# Patient Record
Sex: Female | Born: 1960
Health system: Southern US, Community
[De-identification: ages and names within clinical notes are randomized; demographics above are authoritative.]

## PROBLEM LIST (undated history)

## (undated) DIAGNOSIS — L920 Granuloma annulare: Secondary | ICD-10-CM

## (undated) DIAGNOSIS — M1990 Unspecified osteoarthritis, unspecified site: Secondary | ICD-10-CM

## (undated) DIAGNOSIS — C801 Malignant (primary) neoplasm, unspecified: Secondary | ICD-10-CM

## (undated) DIAGNOSIS — I341 Nonrheumatic mitral (valve) prolapse: Secondary | ICD-10-CM

## (undated) DIAGNOSIS — R011 Cardiac murmur, unspecified: Secondary | ICD-10-CM

## (undated) HISTORY — DX: Cardiac murmur, unspecified: R01.1

## (undated) HISTORY — PX: SMALL INTESTINE SURGERY: SHX150

## (undated) HISTORY — DX: Unspecified osteoarthritis, unspecified site: M19.90

## (undated) HISTORY — PX: OTHER SURGICAL HISTORY: SHX169

---

## 1997-12-12 ENCOUNTER — Other Ambulatory Visit: Admission: RE | Admit: 1997-12-12 | Discharge: 1997-12-12 | Payer: Self-pay | Admitting: Gynecology

## 1998-01-16 ENCOUNTER — Emergency Department (HOSPITAL_COMMUNITY): Admission: EM | Admit: 1998-01-16 | Discharge: 1998-01-16 | Payer: Self-pay | Admitting: Emergency Medicine

## 1998-10-10 ENCOUNTER — Inpatient Hospital Stay (HOSPITAL_COMMUNITY): Admission: AD | Admit: 1998-10-10 | Discharge: 1998-10-12 | Payer: Self-pay | Admitting: Gynecology

## 1998-11-25 ENCOUNTER — Other Ambulatory Visit: Admission: RE | Admit: 1998-11-25 | Discharge: 1998-11-25 | Payer: Self-pay | Admitting: Gynecology

## 1999-11-25 ENCOUNTER — Other Ambulatory Visit: Admission: RE | Admit: 1999-11-25 | Discharge: 1999-11-25 | Payer: Self-pay | Admitting: Gynecology

## 2000-12-16 ENCOUNTER — Encounter: Payer: Self-pay | Admitting: Gynecology

## 2000-12-16 ENCOUNTER — Ambulatory Visit (HOSPITAL_COMMUNITY): Admission: RE | Admit: 2000-12-16 | Discharge: 2000-12-16 | Payer: Self-pay | Admitting: Gynecology

## 2001-02-02 ENCOUNTER — Other Ambulatory Visit: Admission: RE | Admit: 2001-02-02 | Discharge: 2001-02-02 | Payer: Self-pay | Admitting: Gynecology

## 2001-10-21 ENCOUNTER — Emergency Department (HOSPITAL_COMMUNITY): Admission: EM | Admit: 2001-10-21 | Discharge: 2001-10-21 | Payer: Self-pay | Admitting: Emergency Medicine

## 2001-10-21 ENCOUNTER — Encounter: Payer: Self-pay | Admitting: Emergency Medicine

## 2002-01-03 ENCOUNTER — Ambulatory Visit (HOSPITAL_COMMUNITY): Admission: RE | Admit: 2002-01-03 | Discharge: 2002-01-03 | Payer: Self-pay | Admitting: Gynecology

## 2002-01-03 ENCOUNTER — Encounter: Payer: Self-pay | Admitting: Gynecology

## 2002-03-27 ENCOUNTER — Other Ambulatory Visit: Admission: RE | Admit: 2002-03-27 | Discharge: 2002-03-27 | Payer: Self-pay | Admitting: Gynecology

## 2002-05-03 ENCOUNTER — Encounter: Admission: RE | Admit: 2002-05-03 | Discharge: 2002-05-03 | Payer: Self-pay | Admitting: Gynecology

## 2002-05-03 ENCOUNTER — Encounter: Payer: Self-pay | Admitting: Gynecology

## 2003-04-16 ENCOUNTER — Other Ambulatory Visit: Admission: RE | Admit: 2003-04-16 | Discharge: 2003-04-16 | Payer: Self-pay | Admitting: Gynecology

## 2003-05-01 ENCOUNTER — Encounter: Admission: RE | Admit: 2003-05-01 | Discharge: 2003-05-01 | Payer: Self-pay | Admitting: Gynecology

## 2003-09-17 ENCOUNTER — Other Ambulatory Visit: Admission: RE | Admit: 2003-09-17 | Discharge: 2003-09-17 | Payer: Self-pay | Admitting: Gynecology

## 2004-01-02 ENCOUNTER — Other Ambulatory Visit: Admission: RE | Admit: 2004-01-02 | Discharge: 2004-01-02 | Payer: Self-pay | Admitting: Gynecology

## 2004-04-21 ENCOUNTER — Other Ambulatory Visit: Admission: RE | Admit: 2004-04-21 | Discharge: 2004-04-21 | Payer: Self-pay | Admitting: Gynecology

## 2004-06-09 ENCOUNTER — Encounter: Admission: RE | Admit: 2004-06-09 | Discharge: 2004-06-09 | Payer: Self-pay | Admitting: Gynecology

## 2004-10-22 ENCOUNTER — Other Ambulatory Visit: Admission: RE | Admit: 2004-10-22 | Discharge: 2004-10-22 | Payer: Self-pay | Admitting: Gynecology

## 2005-02-17 ENCOUNTER — Other Ambulatory Visit: Admission: RE | Admit: 2005-02-17 | Discharge: 2005-02-17 | Payer: Self-pay | Admitting: Gynecology

## 2005-05-21 ENCOUNTER — Other Ambulatory Visit: Admission: RE | Admit: 2005-05-21 | Discharge: 2005-05-21 | Payer: Self-pay | Admitting: Gynecology

## 2005-06-11 ENCOUNTER — Encounter: Admission: RE | Admit: 2005-06-11 | Discharge: 2005-06-11 | Payer: Self-pay | Admitting: Gynecology

## 2005-11-20 ENCOUNTER — Other Ambulatory Visit: Admission: RE | Admit: 2005-11-20 | Discharge: 2005-11-20 | Payer: Self-pay | Admitting: Family Medicine

## 2006-05-26 ENCOUNTER — Other Ambulatory Visit: Admission: RE | Admit: 2006-05-26 | Discharge: 2006-05-26 | Payer: Self-pay | Admitting: Gynecology

## 2006-06-16 ENCOUNTER — Encounter: Admission: RE | Admit: 2006-06-16 | Discharge: 2006-06-16 | Payer: Self-pay | Admitting: Gynecology

## 2006-11-25 ENCOUNTER — Other Ambulatory Visit: Admission: RE | Admit: 2006-11-25 | Discharge: 2006-11-25 | Payer: Self-pay | Admitting: Gynecology

## 2007-01-03 ENCOUNTER — Encounter: Admission: RE | Admit: 2007-01-03 | Discharge: 2007-01-03 | Payer: Self-pay | Admitting: Gynecology

## 2007-06-01 ENCOUNTER — Other Ambulatory Visit: Admission: RE | Admit: 2007-06-01 | Discharge: 2007-06-01 | Payer: Self-pay | Admitting: Gynecology

## 2008-01-02 ENCOUNTER — Ambulatory Visit: Payer: Self-pay | Admitting: Women's Health

## 2008-01-02 ENCOUNTER — Encounter: Payer: Self-pay | Admitting: Women's Health

## 2008-01-02 ENCOUNTER — Other Ambulatory Visit: Admission: RE | Admit: 2008-01-02 | Discharge: 2008-01-02 | Payer: Self-pay | Admitting: Gynecology

## 2008-06-06 ENCOUNTER — Encounter: Payer: Self-pay | Admitting: Women's Health

## 2008-06-06 ENCOUNTER — Ambulatory Visit: Payer: Self-pay | Admitting: Women's Health

## 2008-06-06 ENCOUNTER — Other Ambulatory Visit: Admission: RE | Admit: 2008-06-06 | Discharge: 2008-06-06 | Payer: Self-pay | Admitting: Gynecology

## 2008-07-02 ENCOUNTER — Encounter: Admission: RE | Admit: 2008-07-02 | Discharge: 2008-07-02 | Payer: Self-pay | Admitting: Gynecology

## 2009-06-10 ENCOUNTER — Ambulatory Visit: Payer: Self-pay | Admitting: Women's Health

## 2009-06-10 ENCOUNTER — Other Ambulatory Visit: Admission: RE | Admit: 2009-06-10 | Discharge: 2009-06-10 | Payer: Self-pay | Admitting: Gynecology

## 2009-07-03 ENCOUNTER — Encounter: Admission: RE | Admit: 2009-07-03 | Discharge: 2009-07-03 | Payer: Self-pay | Admitting: Obstetrics and Gynecology

## 2010-02-16 ENCOUNTER — Encounter: Payer: Self-pay | Admitting: Gynecology

## 2010-06-18 ENCOUNTER — Other Ambulatory Visit: Payer: Self-pay | Admitting: Women's Health

## 2010-06-18 DIAGNOSIS — Z1231 Encounter for screening mammogram for malignant neoplasm of breast: Secondary | ICD-10-CM

## 2010-06-19 ENCOUNTER — Encounter: Payer: Self-pay | Admitting: Women's Health

## 2010-06-26 ENCOUNTER — Other Ambulatory Visit: Payer: Self-pay | Admitting: Women's Health

## 2010-06-26 ENCOUNTER — Encounter (INDEPENDENT_AMBULATORY_CARE_PROVIDER_SITE_OTHER): Payer: BC Managed Care – PPO | Admitting: Women's Health

## 2010-06-26 ENCOUNTER — Other Ambulatory Visit (HOSPITAL_COMMUNITY)
Admission: RE | Admit: 2010-06-26 | Discharge: 2010-06-26 | Disposition: A | Discharge status: Home / Self Care | Payer: BC Managed Care – PPO | Source: Ambulatory Visit | Attending: Gynecology | Admitting: Gynecology

## 2010-06-26 DIAGNOSIS — Z124 Encounter for screening for malignant neoplasm of cervix: Secondary | ICD-10-CM | POA: Insufficient documentation

## 2010-06-26 DIAGNOSIS — E079 Disorder of thyroid, unspecified: Secondary | ICD-10-CM

## 2010-06-26 DIAGNOSIS — Z01419 Encounter for gynecological examination (general) (routine) without abnormal findings: Secondary | ICD-10-CM

## 2010-06-26 DIAGNOSIS — Z13220 Encounter for screening for lipoid disorders: Secondary | ICD-10-CM

## 2010-07-11 ENCOUNTER — Ambulatory Visit
Admission: RE | Admit: 2010-07-11 | Discharge: 2010-07-11 | Disposition: A | Discharge status: Home / Self Care | Payer: BC Managed Care – PPO | Source: Ambulatory Visit | Attending: Women's Health | Admitting: Women's Health

## 2010-07-11 DIAGNOSIS — Z1231 Encounter for screening mammogram for malignant neoplasm of breast: Secondary | ICD-10-CM

## 2011-03-30 ENCOUNTER — Encounter: Payer: Self-pay | Admitting: Internal Medicine

## 2011-04-23 ENCOUNTER — Encounter: Payer: Self-pay | Admitting: Internal Medicine

## 2011-04-23 ENCOUNTER — Ambulatory Visit (AMBULATORY_SURGERY_CENTER): Payer: BC Managed Care – PPO | Admitting: *Deleted

## 2011-04-23 VITALS — Ht 62.0 in | Wt 134.1 lb

## 2011-04-23 DIAGNOSIS — Z1211 Encounter for screening for malignant neoplasm of colon: Secondary | ICD-10-CM

## 2011-04-23 MED ORDER — PEG-KCL-NACL-NASULF-NA ASC-C 100 G PO SOLR: ORAL | Status: DC

## 2011-05-08 ENCOUNTER — Encounter: Payer: Self-pay | Admitting: Internal Medicine

## 2011-05-08 ENCOUNTER — Ambulatory Visit (AMBULATORY_SURGERY_CENTER): Payer: BC Managed Care – PPO | Admitting: Internal Medicine

## 2011-05-08 VITALS — BP 109/66 | HR 57 | Temp 97.2°F | Resp 16 | Ht 62.0 in | Wt 134.0 lb

## 2011-05-08 DIAGNOSIS — Z1211 Encounter for screening for malignant neoplasm of colon: Secondary | ICD-10-CM

## 2011-05-08 HISTORY — PX: COLONOSCOPY: SHX174

## 2011-05-08 MED ORDER — SODIUM CHLORIDE 0.9 % IV SOLN: 500.0000 mL | INTRAVENOUS | Status: DC

## 2011-05-08 NOTE — Patient Instructions (Signed)

## 2011-05-08 NOTE — Op Note (Addendum)
[facility] [address] [place], [place]  [address]  COLONOSCOPY PROCEDURE REPORT  PATIENT:  Sarah Jimenez, Sarah Jimenez  MR#:  [id number] BIRTHDATE:  1960-09-16, 51 yrs. old  GENDER:  female ENDOSCOPIST:  Hedwig Morton. Juanda Chance, MD REF. BY:  Dr Cain Saupe, Jacqlyn Larsen PROCEDURE DATE:  05/08/2011 PROCEDURE:  Colonoscopy 19147 ASA CLASS:  Class I INDICATIONS:  colorectal cancer screening, average risk MEDICATIONS:   MAC sedation, administered by CRNA, propofol (Diprivan) 350 mg  DESCRIPTION OF PROCEDURE:   After the risks and benefits and of the procedure were explained, informed consent was obtained. Digital rectal exam was performed and revealed no rectal masses. The LB 180AL [id number] endoscope was introduced through the anus and advanced to the cecum, which was identified by both the appendix and ileocecal valve.  The quality of the prep was good, using MoviPrep.  The instrument was then slowly withdrawn as the colon was fully examined. <<PROCEDUREIMAGES>>  FINDINGS:  No polyps or cancers were seen (see image1, image2, image3, image4, and image5).   Retroflexion was not performed. The scope was then withdrawn from the patient and the procedure completed.  COMPLICATIONS:  None ENDOSCOPIC IMPRESSION: 1) No polyps or cancers 2) Normal colonoscopy RECOMMENDATIONS: 1) High fiber diet.  REPEAT EXAM:  In 10 year(s) for.  ______________________________ Hedwig Morton. Juanda Chance, MD  CC:  n. REVISED:  05/08/2011 12:18 PM eSIGNED:   Hedwig Morton. Madi Bonfiglio at 05/08/2011 12:18 PM  Costella Hatcher, [phone]

## 2011-05-11 ENCOUNTER — Telehealth: Payer: Self-pay | Admitting: *Deleted

## 2011-05-11 NOTE — Telephone Encounter (Signed)
  Follow up Call-  Call back number 05/08/2011  Post procedure Call Back phone  # [phone]  Permission to leave phone message Yes     Patient questions:  Do you have a fever, pain , or abdominal swelling? no Pain Score  0 *  Have you tolerated food without any problems? yes  Have you been able to return to your normal activities? yes  Do you have any questions about your discharge instructions: Diet   no Medications  no Follow up visit  no  Do you have questions or concerns about your Care? no  Actions: * If pain score is 4 or above: No action needed, pain <4.

## 2011-06-25 ENCOUNTER — Other Ambulatory Visit: Payer: Self-pay | Admitting: Women's Health

## 2011-06-25 DIAGNOSIS — Z1231 Encounter for screening mammogram for malignant neoplasm of breast: Secondary | ICD-10-CM

## 2011-07-03 ENCOUNTER — Other Ambulatory Visit (HOSPITAL_COMMUNITY)
Admission: RE | Admit: 2011-07-03 | Discharge: 2011-07-03 | Disposition: A | Discharge status: Home / Self Care | Payer: BC Managed Care – PPO | Source: Ambulatory Visit | Attending: Women's Health | Admitting: Women's Health

## 2011-07-03 ENCOUNTER — Ambulatory Visit (INDEPENDENT_AMBULATORY_CARE_PROVIDER_SITE_OTHER): Payer: BC Managed Care – PPO | Admitting: Women's Health

## 2011-07-03 ENCOUNTER — Encounter: Payer: Self-pay | Admitting: Women's Health

## 2011-07-03 VITALS — BP 106/68 | Ht 63.0 in | Wt 129.0 lb

## 2011-07-03 DIAGNOSIS — B009 Herpesviral infection, unspecified: Secondary | ICD-10-CM | POA: Insufficient documentation

## 2011-07-03 DIAGNOSIS — Z13220 Encounter for screening for lipoid disorders: Secondary | ICD-10-CM

## 2011-07-03 DIAGNOSIS — E079 Disorder of thyroid, unspecified: Secondary | ICD-10-CM

## 2011-07-03 DIAGNOSIS — Z833 Family history of diabetes mellitus: Secondary | ICD-10-CM

## 2011-07-03 DIAGNOSIS — Z01419 Encounter for gynecological examination (general) (routine) without abnormal findings: Secondary | ICD-10-CM | POA: Insufficient documentation

## 2011-07-03 DIAGNOSIS — Z1159 Encounter for screening for other viral diseases: Secondary | ICD-10-CM | POA: Insufficient documentation

## 2011-07-03 DIAGNOSIS — N841 Polyp of cervix uteri: Secondary | ICD-10-CM

## 2011-07-03 LAB — CBC WITH DIFFERENTIAL/PLATELET
Basophils Absolute: 0.1 10*3/uL (ref 0.0–0.1)
Basophils Relative: 2 % — ABNORMAL HIGH (ref 0–1)
Eosinophils Absolute: 0.1 10*3/uL (ref 0.0–0.7)
Eosinophils Relative: 3 % (ref 0–5)
HCT: 41.1 % (ref 36.0–46.0)
Hemoglobin: 13.8 g/dL (ref 12.0–15.0)
Lymphocytes Relative: 28 % (ref 12–46)
Lymphs Abs: 1.4 10*3/uL (ref 0.7–4.0)
MCH: 31.7 pg (ref 26.0–34.0)
MCHC: 33.6 g/dL (ref 30.0–36.0)
MCV: 94.3 fL (ref 78.0–100.0)
Monocytes Absolute: 0.3 10*3/uL (ref 0.1–1.0)
Monocytes Relative: 5 % (ref 3–12)
Neutro Abs: 3.2 10*3/uL (ref 1.7–7.7)
Neutrophils Relative %: 62 % (ref 43–77)
Platelets: 357 10*3/uL (ref 150–400)
RBC: 4.36 MIL/uL (ref 3.87–5.11)
RDW: 14.1 % (ref 11.5–15.5)
WBC: 5.1 10*3/uL (ref 4.0–10.5)

## 2011-07-03 LAB — LIPID PANEL
Cholesterol: 178 mg/dL (ref 0–200)
HDL: 81 mg/dL (ref >39)
LDL Cholesterol: 85 mg/dL (ref 0–99)
Total CHOL/HDL Ratio: 2.2 Ratio
Triglycerides: 59 mg/dL (ref <150)
VLDL: 12 mg/dL (ref 0–40)

## 2011-07-03 LAB — TSH: TSH: 1.81 u[IU]/mL (ref 0.350–4.500)

## 2011-07-03 LAB — GLUCOSE, RANDOM: Glucose, Bld: 85 mg/dL (ref 70–99)

## 2011-07-03 NOTE — Progress Notes (Signed)
Sarah Jimenez [birth year] [id number]    History:    The patient presents for annual exam.  Cycles every 1-3 months for 5 days/vasectomy/same partner for years. History of ascus with negative high-risk HPV in 08, several Paps with ascus/suspicion for LGSIL/CIN-1. Had a negative colonoscopy April 2013. History of normal mammograms, right breast 1 cm mobile nontender nodule at 8:00 below areola that has been ultrasounded and stable for years. Evaluated at [facility] in 2009.   Past medical history, past surgical history, family history and social history were all reviewed and documented in the EPIC chart. 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   ROS:  A  ROS was performed and pertinent positives and negatives are included in the history.  Exam:  Filed Vitals:   07/03/11 0855  BP: 106/68    General appearance:  Normal Head/Neck:  Normal, without cervical or supraclavicular adenopathy. Thyroid:  Symmetrical, normal in size, without palpable masses or nodularity. Respiratory  Effort:  Normal  Auscultation:  Clear without wheezing or rhonchi Cardiovascular  Auscultation:  Regular rate, without rubs, murmurs or gallops  Edema/varicosities:  Not grossly evident Abdominal  Soft,nontender, without masses, guarding or rebound.  Liver/spleen:  No organomegaly noted  Hernia:  None appreciated  Skin  Inspection:  Grossly normal  Palpation:  Grossly normal Neurologic/psychiatric  Orientation:  Normal with appropriate conversation.  Mood/affect:  Normal  Genitourinary    Breasts: Examined lying and sitting.     Right: Without masses, retractions, discharge or axillary adenopathy.     Left: Without masses, retractions, discharge or axillary adenopathy.   Inguinal/mons:  Normal without inguinal adenopathy  External genitalia:  Normal  BUS/Urethra/Skene's glands:  Normal  Bladder:  Normal  Vagina:  Normal  Cervix:  Normal/small less than 1 cm polyp removed in fragments.  Uterus:    normal in size, shape and contour.  Midline and mobile  Adnexa/parametria:     Rt: Without masses or tenderness.   Lt: Without masses or tenderness.  Anus and perineum: Normal  Digital rectal exam: Normal sphincter tone without palpated masses or tenderness  Assessment/Plan:  51 y.o. D WF G4 P. for for annual exam with complaint of vaginal dryness.  Perimenopausal exam History of ascus with negative high-risk HPV in 2008 with normal Paps after Stable right breast nodule HSV-2-rare outbreaks  Plan: Reviewed importance of SBE's reporting changes and annual mammogram. Encouraged vaginal lubricants for dryness, menopause reviewed, will call if becomes amenorrheic. CBC, glucose, lipid panel, TSH, UA and Pap with HPV typing. Reviewed and new guidelines for Pap screening.Tdap vaccine reviewed and recommended. Acyclovir 200 4 times a day when necessary,denies need for new prescription.    Harrington Challenger [facility], 12:07 PM 07/03/2011

## 2011-07-03 NOTE — Patient Instructions (Signed)
Tdap vaccine Health Recommendations for Postmenopausal Women Based on the Results of the Women's Health Initiative (WHI) and Other Studies The WHI is a major 15-year research program to address the most common causes of death, disability and poor quality of life in postmenopausal women. Some of these causes are heart disease, cancer, bone loss (osteoporosis) and others. Taking into account all of the findings from WHI and other studies, here are bottom-line health recommendations for women: CARDIOVASCULAR DISEASE Heart Disease: A heart attack is a medical emergency. Know the signs and symptoms of a heart attack. Hormone therapy should not be used to prevent heart disease. In women with heart disease, hormone therapy should not be used to prevent further disease. Hormone therapy increases the risk of blood clots. Below are things women can do to reduce their risk for heart disease.   Do not smoke. If you smoke, quit. Women who smoke are 2 to 6 times more likely to suffer a heart attack than non-smoking women.   Aim for a healthy weight. Being overweight causes many preventable deaths. Eat a healthy and balanced diet and drink an adequate amount of liquids.   Get moving. Make a commitment to be more physically active. Aim for 30 minutes of activity on most, if not all days of the week.   Eat for heart health. Choose a diet that is low in saturated fat, trans fat, and cholesterol. Include whole grains, vegetables, and fruits. Read the labels on the food container before buying it.   Know your numbers. Ask your caregiver to check your blood pressure, cholesterol (total, HDL, LDL, triglycerides) and blood glucose. Work with your caregiver to improve any numbers that are not normal.   High blood pressure. Limit or stop your table salt intake (try salt substitute and food seasonings), avoid salty foods and drinks. Read the labels on the food container before buying it. Avoid becoming overweight by eating  well and exercising.  STROKE  Stroke is a medical emergency. Stroke can be the result of a blood clot in the blood vessel in the brain or by a brain hemorrhage (bleeding). Know the signs and symptoms of a stroke. To lower the risk of developing a stroke:  Avoid fatty foods.   Quit smoking.   Control your diabetes, blood pressure, and irregular heart rate.  THROMBOPHLIBITIS (BLOOD CLOT) OF THE LEG  Hormone treatment is a big cause of developing blood clots in the leg. Becoming overweight and leading a stationary lifestyle also may contribute to developing blood clots. Controlling your diet and exercising will help lower the risk of developing blood clots. CANCER SCREENING  Breast Cancer: Women should take steps to reduce their risk of breast cancer. This includes having regular mammograms, monthly self breast exams and regular breast exams by your caregiver. Have a mammogram every one to two years if you are 40 to 51 years old. Have a mammogram annually if you are 50 years old or older depending on your risk factors. Women who are high risk for breast cancer may need more frequent mammograms. There are tests available (testing the genes in your body) if you have family history of breast cancer called BRCA 1 and 2. These tests can help determine the risks of developing breast cancer.   Intestinal or Stomach Cancer: Women should talk to their caregiver about when to start screening, what tests and how often they should be done, and the benefits and risks of doing these tests. Tests to consider are a rectal   exam, fecal occult blood, sigmoidoscopy, colononoscoby, barium enema and upper GI series of the stomach. Depending on the age, you may want to get a medical and family history of colon cancer. Women who are high risk may need to be screened at an earlier age and more often.   Cervical Cancer: A Pap test of the cervix should be done every year and every 3 years when there has been three straight years  of a normal Pap test. Women with an abnormal Pap test should be screened more often or have a cervical biopsy depending on your caregiver's recommendation.   Uterine Cancer: If you have vaginal bleeding after you are in the menopause, it should be evaluated by your caregiver.   Ovarian cancer: There are no reliable tests available to screen for ovarian cancer at this time except for yearly pelvic exams.   Lung Cancer: Yearly chest X-rays can detect lung cancer and should be done on high risk women, such as cigarette smokers and women with chronic lung disease (emphysemia).   Skin Cancer: A complete body skin exam should be done at your yearly examination. Avoid overexposure to the sun and ultraviolet light lamps. Use a strong sun block cream when in the sun. All of these things are important in lowering the risk of skin cancer.  MENOPAUSE Menopause Symptoms: Hormone therapy products are effective for treating symptoms associated with menopause:  Moderate to severe hot flashes.   Night sweats.   Mood swings.   Headaches.   Tiredness.   Loss of sex drive.   Insomnia.   Other symptoms.  However, hormone therapy products carry serious risks, especially in older women. Women who use or are thinking about using estrogen or estrogen with progestin treatments should discuss that with their caregiver. Your caregiver will know if the benefits outweigh the risks. The Food and Drug Administration (FDA) has concluded that hormone therapy should be used only at the lowest doses and for the shortest amount of time to reach treatment goals. It is not known at what doses there may be less risk of serious side effects. There are other treatments such as herbal medication (not controlled or regulated by the FDA), group therapy, counseling and acupuncture that may be helpful. OSTEOPOROSIS Protecting Against Bone Loss and Preventing Fracture: If hormone therapy is used for prevention of bone loss  (osteoporosis), the risks for bone loss must outweigh the risk of the therapy. Women considering taking hormone therapy for bone loss should ask their health care providers about other medications (fosamax and boniva) that are considered safe and effective for preventing bone loss and bone fractures. To guard against bone loss or fractures, it is recommended that women should take at least 1000-1500 mg of calcium and 400-800 IU of vitamin D daily in divided doses. Smoking and excessive alcohol intake increases the risk of osteoporosis. Eat foods rich in calcium and vitamin D and do weight bearing exercises several times a week as your caregiver suggests. DIABETES Diabetes Melitus: Women with Type I or Type 2 diabetes should keep their diabetes in control with diet, exercise and medication. Avoid too many sweets, starchy and fatty foods. Being overweight can affect your diabetes. COGNITION AND MEMORY Cognition and Memory: Menopausal hormone therapy is not recommended for the prevention of cognitive disorders such as Alzheimer's disease or memory loss. WHI found that women treated with hormone therapy have a greater risk of developing dementia.  DEPRESSION  Depression may occur at any age, but is common in   elderly women. The reasons may be because of physical, medical, social (loneliness), financial and/or economic problems and needs. Becoming involved with church, volunteer or social groups, seeking treatment for any physical or medical problems is recommended. Also, look into getting professional advice for any economic or financial problems. ACCIDENTS  Accidents are common and can be serious in the elderly woman. Prepare your house to prevent accidents. Eliminate throw rugs, use hip protectors, place hand bars in the bath, shower and toilet areas. Avoid wearing high heel shoes and walking on wet, snowy and icy areas. Stop driving if you have vision, hearing problems or are unsteady with you movements and  reflexes. RHEUMATOID ARTHRITIS Rheumatoid arthritis causes pain, swelling and stiffness of your bone joints. It can limit many of your activities. Over-the-counter medications may help, but prescription medications may be necessary. Talk with your caregiver about this. Exercise (walking, water aerobics), good posture, using splints on painful joints, warm baths or applying warm compresses to stiff joints and cold compresses to painful joints may be helpful. Smoking and excessive drinking may worsen the symptoms of arthritis. Seek help from a physical therapist if the arthritis is becoming a problem with your daily activities. IMMUNIZATIONS  Several immunizations are important to have during your senior years, including:   Tetanus and a diptheria shot booster every 10 years.   Influenza every year before the flu season begins.   Pneumonia vaccine.   Shingles vaccine.   Others as indicated (example: H1N1 vaccine).  Document Released: 03/06/2005 Document Revised: 01/01/2011 Document Reviewed: 10/31/2007 Exit[employer] Patient Information 2012 [employer]. 

## 2011-07-04 LAB — URINALYSIS W MICROSCOPIC + REFLEX CULTURE
Bacteria, UA: NONE SEEN
Bilirubin Urine: NEGATIVE
Casts: NONE SEEN
Crystals: NONE SEEN
Glucose, UA: NEGATIVE mg/dL
Hgb urine dipstick: NEGATIVE
Ketones, ur: NEGATIVE mg/dL
Leukocytes, UA: NEGATIVE
Nitrite: NEGATIVE
Protein, ur: NEGATIVE mg/dL
Specific Gravity, Urine: <1.005 — ABNORMAL LOW (ref 1.005–1.030)
Squamous Epithelial / LPF: NONE SEEN
Urobilinogen, UA: 0.2 mg/dL (ref 0.0–1.0)
pH: 7.5 (ref 5.0–8.0)

## 2011-07-24 ENCOUNTER — Ambulatory Visit
Admission: RE | Admit: 2011-07-24 | Discharge: 2011-07-24 | Disposition: A | Discharge status: Home / Self Care | Payer: BC Managed Care – PPO | Source: Ambulatory Visit | Attending: Women's Health | Admitting: Women's Health

## 2011-07-24 DIAGNOSIS — Z1231 Encounter for screening mammogram for malignant neoplasm of breast: Secondary | ICD-10-CM

## 2011-10-02 ENCOUNTER — Other Ambulatory Visit: Payer: Self-pay | Admitting: *Deleted

## 2011-10-02 MED ORDER — ACYCLOVIR 200 MG PO CAPS: 200.0000 mg | ORAL_CAPSULE | Freq: Every day | ORAL | Status: DC

## 2012-07-04 ENCOUNTER — Ambulatory Visit (INDEPENDENT_AMBULATORY_CARE_PROVIDER_SITE_OTHER): Payer: BC Managed Care – PPO | Admitting: Women's Health

## 2012-07-04 ENCOUNTER — Encounter: Payer: Self-pay | Admitting: Women's Health

## 2012-07-04 ENCOUNTER — Other Ambulatory Visit (HOSPITAL_COMMUNITY)
Admission: RE | Admit: 2012-07-04 | Discharge: 2012-07-04 | Disposition: A | Discharge status: Home / Self Care | Payer: BC Managed Care – PPO | Source: Ambulatory Visit | Attending: Gynecology | Admitting: Gynecology

## 2012-07-04 VITALS — BP 112/70 | Ht 63.5 in | Wt 131.0 lb

## 2012-07-04 DIAGNOSIS — Z833 Family history of diabetes mellitus: Secondary | ICD-10-CM

## 2012-07-04 DIAGNOSIS — Z01419 Encounter for gynecological examination (general) (routine) without abnormal findings: Secondary | ICD-10-CM | POA: Insufficient documentation

## 2012-07-04 DIAGNOSIS — G4700 Insomnia, unspecified: Secondary | ICD-10-CM

## 2012-07-04 LAB — CBC WITH DIFFERENTIAL/PLATELET
Basophils Absolute: 0.1 10*3/uL (ref 0.0–0.1)
Basophils Relative: 2 % — ABNORMAL HIGH (ref 0–1)
Eosinophils Absolute: 0.2 10*3/uL (ref 0.0–0.7)
Eosinophils Relative: 4 % (ref 0–5)
HCT: 37.2 % (ref 36.0–46.0)
Hemoglobin: 12.4 g/dL (ref 12.0–15.0)
Lymphocytes Relative: 25 % (ref 12–46)
Lymphs Abs: 1 10*3/uL (ref 0.7–4.0)
MCH: 30.1 pg (ref 26.0–34.0)
MCHC: 33.3 g/dL (ref 30.0–36.0)
MCV: 90.3 fL (ref 78.0–100.0)
Monocytes Absolute: 0.4 10*3/uL (ref 0.1–1.0)
Monocytes Relative: 10 % (ref 3–12)
Neutro Abs: 2.4 10*3/uL (ref 1.7–7.7)
Neutrophils Relative %: 59 % (ref 43–77)
Platelets: 344 10*3/uL (ref 150–400)
RBC: 4.12 MIL/uL (ref 3.87–5.11)
RDW: 13.9 % (ref 11.5–15.5)
WBC: 4 10*3/uL (ref 4.0–10.5)

## 2012-07-04 LAB — GLUCOSE, RANDOM: Glucose, Bld: 86 mg/dL (ref 70–99)

## 2012-07-04 LAB — TSH: TSH: 2.907 u[IU]/mL (ref 0.350–4.500)

## 2012-07-04 MED ORDER — ALPRAZOLAM 0.25 MG PO TABS: 0.2500 mg | ORAL_TABLET | Freq: Every evening | ORAL | Status: DC | PRN

## 2012-07-04 NOTE — Addendum Note (Signed)
Addended by: Richardson Chiquito on: 07/04/2012 09:42 AM   Modules accepted: Orders

## 2012-07-04 NOTE — Patient Instructions (Signed)

## 2012-07-04 NOTE — Progress Notes (Signed)
Sarah Jimenez [birth year] [id number]    History:    The patient presents for annual exam.  Continues with cycles every one to 2 months/vasectomy/same partner. History of right breast 1 cm mobile cyst stable, has seen physicianat [facility], mammograms normal. Ascus with negative HR HPV 2008 with normal Paps after. HSV-2 with rare outbreaks uses a cycle of your on occasion. Negative colonoscopy 2013. Having occasional hot flushes, and some difficulty with sleeping. Excellent lipid panel.  Past medical history, past surgical history, family history and social history were all reviewed and documented in the EPIC chart. 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 Three have some issues with anxiety   ROS:  A  ROS was performed and pertinent positives and negatives are included in the history.  Exam:  Filed Vitals:   07/04/12 0831  BP: 112/70    General appearance:  Normal Head/Neck:  Normal, without cervical or supraclavicular adenopathy. Thyroid:  Symmetrical, normal in size, without palpable masses or nodularity. Respiratory  Effort:  Normal  Auscultation:  Clear without wheezing or rhonchi Cardiovascular  Auscultation:  Regular rate, without rubs, murmurs or gallops  Edema/varicosities:  Not grossly evident Abdominal  Soft,nontender, without masses, guarding or rebound.  Liver/spleen:  No organomegaly noted  Hernia:  None appreciated  Skin  Inspection:  Grossly normal  Palpation:  Grossly normal Neurologic/psychiatric  Orientation:  Normal with appropriate conversation.  Mood/affect:  Normal  Genitourinary    Breasts: Examined lying and sitting.     Right: Without masses, retractions, discharge or axillary adenopathy.     Left: Without masses, retractions, discharge or axillary adenopathy.   Inguinal/mons:  Normal without inguinal adenopathy  External genitalia:  Normal  BUS/Urethra/Skene's glands:  Normal  Bladder:  Normal  Vagina:  Normal  Cervix:   Normal  Uterus: normal in size, shape and contour.  Midline and mobile  Adnexa/parametria:     Rt: Without masses or tenderness.   Lt: Without masses or tenderness.  Anus and perineum: Normal  Digital rectal exam: Normal sphincter tone without palpated masses or tenderness  Assessment/Plan:  52 y.o. [name] for annual exam with complaint of insomnia.  Perimenopausal exam HSV-2 rare outbreaks Situational stress her/anxiety/insomnia  Plan: Counseling as needed, Xanax 0.25 at bedtime as needed, aware addictive properties. SBE's, continue annual mammogram, calcium rich diet, vitamin D 1000 daily. Instructed to call if cycles space greater than 3 months. CBC, glucose, TSH, UA, Pap.    Harrington Challenger [facility], 9:27 AM 07/04/2012

## 2012-07-05 LAB — URINALYSIS W MICROSCOPIC + REFLEX CULTURE
Bacteria, UA: NONE SEEN
Bilirubin Urine: NEGATIVE
Casts: NONE SEEN
Crystals: NONE SEEN
Glucose, UA: NEGATIVE mg/dL
Hgb urine dipstick: NEGATIVE
Ketones, ur: NEGATIVE mg/dL
Leukocytes, UA: NEGATIVE
Nitrite: NEGATIVE
Protein, ur: NEGATIVE mg/dL
Specific Gravity, Urine: 1.008 (ref 1.005–1.030)
Squamous Epithelial / LPF: NONE SEEN
Urobilinogen, UA: 0.2 mg/dL (ref 0.0–1.0)
pH: 7 (ref 5.0–8.0)

## 2012-07-11 ENCOUNTER — Other Ambulatory Visit: Payer: Self-pay

## 2012-07-11 DIAGNOSIS — Z1231 Encounter for screening mammogram for malignant neoplasm of breast: Secondary | ICD-10-CM

## 2012-08-10 ENCOUNTER — Ambulatory Visit: Payer: BC Managed Care – PPO

## 2012-09-15 ENCOUNTER — Other Ambulatory Visit: Payer: Self-pay | Admitting: Women's Health

## 2012-09-23 ENCOUNTER — Ambulatory Visit
Admission: RE | Admit: 2012-09-23 | Discharge: 2012-09-23 | Disposition: A | Discharge status: Home / Self Care | Payer: BC Managed Care – PPO | Source: Ambulatory Visit

## 2012-09-23 DIAGNOSIS — Z1231 Encounter for screening mammogram for malignant neoplasm of breast: Secondary | ICD-10-CM

## 2012-12-04 ENCOUNTER — Other Ambulatory Visit: Payer: Self-pay | Admitting: Women's Health

## 2012-12-06 NOTE — Telephone Encounter (Signed)
rx called in [facility] 

## 2013-01-09 ENCOUNTER — Other Ambulatory Visit: Payer: Self-pay

## 2013-01-09 NOTE — Telephone Encounter (Signed)
Patient called requesting refill on Xanax.  I noted last one to be 12/04/12.

## 2013-01-10 MED ORDER — ALPRAZOLAM 0.25 MG PO TABS: ORAL_TABLET | ORAL | Status: DC

## 2013-01-10 NOTE — Telephone Encounter (Signed)
Called into pharmacy

## 2013-04-14 ENCOUNTER — Other Ambulatory Visit: Payer: Self-pay | Admitting: Women's Health

## 2013-07-03 ENCOUNTER — Ambulatory Visit
Admission: RE | Admit: 2013-07-03 | Discharge: 2013-07-03 | Disposition: A | Discharge status: Home / Self Care | Payer: BC Managed Care – PPO | Source: Ambulatory Visit | Attending: Family | Admitting: Family

## 2013-07-03 ENCOUNTER — Other Ambulatory Visit: Payer: Self-pay | Admitting: Family

## 2013-07-03 DIAGNOSIS — M5412 Radiculopathy, cervical region: Secondary | ICD-10-CM

## 2013-07-07 ENCOUNTER — Ambulatory Visit (INDEPENDENT_AMBULATORY_CARE_PROVIDER_SITE_OTHER): Payer: BC Managed Care – PPO | Admitting: Women's Health

## 2013-07-07 ENCOUNTER — Other Ambulatory Visit (HOSPITAL_COMMUNITY)
Admission: RE | Admit: 2013-07-07 | Discharge: 2013-07-07 | Disposition: A | Discharge status: Home / Self Care | Payer: BC Managed Care – PPO | Source: Ambulatory Visit | Attending: Women's Health | Admitting: Women's Health

## 2013-07-07 ENCOUNTER — Encounter: Payer: Self-pay | Admitting: Women's Health

## 2013-07-07 VITALS — BP 114/74 | Ht 63.25 in | Wt 138.2 lb

## 2013-07-07 DIAGNOSIS — Z01419 Encounter for gynecological examination (general) (routine) without abnormal findings: Secondary | ICD-10-CM

## 2013-07-07 DIAGNOSIS — B009 Herpesviral infection, unspecified: Secondary | ICD-10-CM

## 2013-07-07 DIAGNOSIS — Z1151 Encounter for screening for human papillomavirus (HPV): Secondary | ICD-10-CM | POA: Insufficient documentation

## 2013-07-07 DIAGNOSIS — Z13220 Encounter for screening for lipoid disorders: Secondary | ICD-10-CM

## 2013-07-07 LAB — CBC WITH DIFFERENTIAL/PLATELET
Basophils Absolute: 0 10*3/uL (ref 0.0–0.1)
Basophils Relative: 0 % (ref 0–1)
Eosinophils Absolute: 0.1 10*3/uL (ref 0.0–0.7)
Eosinophils Relative: 1 % (ref 0–5)
HCT: 36.8 % (ref 36.0–46.0)
Hemoglobin: 12.4 g/dL (ref 12.0–15.0)
Lymphocytes Relative: 10 % — ABNORMAL LOW (ref 12–46)
Lymphs Abs: 0.9 10*3/uL (ref 0.7–4.0)
MCH: 31.4 pg (ref 26.0–34.0)
MCHC: 33.7 g/dL (ref 30.0–36.0)
MCV: 93.2 fL (ref 78.0–100.0)
Monocytes Absolute: 0.3 10*3/uL (ref 0.1–1.0)
Monocytes Relative: 3 % (ref 3–12)
Neutro Abs: 7.7 10*3/uL (ref 1.7–7.7)
Neutrophils Relative %: 86 % — ABNORMAL HIGH (ref 43–77)
Platelets: 333 10*3/uL (ref 150–400)
RBC: 3.95 MIL/uL (ref 3.87–5.11)
RDW: 13.1 % (ref 11.5–15.5)
WBC: 8.9 10*3/uL (ref 4.0–10.5)

## 2013-07-07 MED ORDER — ALPRAZOLAM 0.25 MG PO TABS: ORAL_TABLET | ORAL | Status: DC

## 2013-07-07 MED ORDER — ACYCLOVIR 200 MG PO CAPS: ORAL_CAPSULE | ORAL | Status: DC

## 2013-07-07 NOTE — Addendum Note (Signed)
Addended by: Alen Blew on: 07/07/2013 11:10 AM   Modules accepted: Orders

## 2013-07-07 NOTE — Patient Instructions (Signed)

## 2013-07-07 NOTE — Progress Notes (Signed)
Sarah Jimenez 1960-05-26 [id number]    History:    Presents for annual exam.  Cycles every 2-3 months/vasectomy/same partner. Continues to have some issues with insomnia and mild anxiety. Uses occasional Xanax. HSV with rare outbreaks . 2008 ascus with negative HR HPV with normal Paps after. Negative colonoscopy 2013. Normal mammogram history. History of right breast cyst - stable was seen at [facility].  Past medical history, past surgical history, family history and social history were all reviewed and documented in the EPIC chart. 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 4 children 22, 20, and 17, 14 on doing well. Father and brother hypertension. Mother healthy, father died from pancreatic cancer age 79.  ROS:  A  12 point ROS was performed and pertinent positives and negatives are included.  Exam:  Filed Vitals:   07/07/13 0906  BP: 114/74    General appearance:  Normal Thyroid:  Symmetrical, normal in size, without palpable masses or nodularity. Respiratory  Auscultation:  Clear without wheezing or rhonchi Cardiovascular  Auscultation:  Regular rate, without rubs, murmurs or gallops  Edema/varicosities:  Not grossly evident Abdominal  Soft,nontender, without masses, guarding or rebound.  Liver/spleen:  No organomegaly noted  Hernia:  None appreciated  Skin  Inspection:  Grossly normal   Breasts: Examined lying and sitting.     Right: Without masses, retractions, discharge or axillary adenopathy.     Left: Without masses, retractions, discharge or axillary adenopathy. Gentitourinary   Inguinal/mons:  Normal without inguinal adenopathy  External genitalia:  Normal  BUS/Urethra/Skene's glands:  Normal  Vagina:  Normal  Cervix:  Normal  Uterus:   normal in size, shape and contour.  Midline and mobile  Adnexa/parametria:     Rt: Without masses or tenderness.   Lt: Without masses or tenderness.  Anus and perineum: Normal  Digital rectal exam: Normal sphincter tone without  palpated masses or tenderness  Assessment/Plan:  53 y.o. [name] for annual exam.    Perimenopausal/vasectomy HSV 2 rare outbreaks Right breast cyst  Plan: SBE's, continue annual mammogram, 3-D tomography reviewed and encouraged, history of dense breasts. Continue regular exercise, calcium rich diet, vitamin D 1000 daily encouraged. Xanax 0.25 prescription, proper use, addictive properties reviewed and she sparingly. Acyclovir 200 as needed, has rare outbreaks. CBC, comprehensive metabolic panel, lipid panel, UA, Pap with HR HPV typing, new screening guidelines reviewed. Pap normal 2013. Instructed to call if cycles space greater than 4 months or increased menopausal symptoms.  Note: This dictation was prepared with Dragon/digital dictation.  Any transcriptional errors that result are unintentional. Huel Cote [facility], 9:58 AM 07/07/2013

## 2013-07-08 LAB — URINALYSIS W MICROSCOPIC + REFLEX CULTURE
Bacteria, UA: NONE SEEN
Bilirubin Urine: NEGATIVE
Casts: NONE SEEN
Crystals: NONE SEEN
Glucose, UA: NEGATIVE mg/dL
Hgb urine dipstick: NEGATIVE
Ketones, ur: NEGATIVE mg/dL
Nitrite: NEGATIVE
Protein, ur: NEGATIVE mg/dL
Specific Gravity, Urine: <1.005 — ABNORMAL LOW (ref 1.005–1.030)
Squamous Epithelial / LPF: NONE SEEN
Urobilinogen, UA: 0.2 mg/dL (ref 0.0–1.0)
pH: 6.5 (ref 5.0–8.0)

## 2013-07-08 LAB — LIPID PANEL
Cholesterol: 171 mg/dL (ref 0–200)
HDL: 80 mg/dL (ref >39)
LDL Cholesterol: 68 mg/dL (ref 0–99)
Total CHOL/HDL Ratio: 2.1 Ratio
Triglycerides: 117 mg/dL (ref <150)
VLDL: 23 mg/dL (ref 0–40)

## 2013-07-08 LAB — COMPREHENSIVE METABOLIC PANEL
ALT: 11 U/L (ref 0–35)
AST: 13 U/L (ref 0–37)
Albumin: 4.1 g/dL (ref 3.5–5.2)
Alkaline Phosphatase: 44 U/L (ref 39–117)
BUN: 11 mg/dL (ref 6–23)
CO2: 28 mEq/L (ref 19–32)
Calcium: 9 mg/dL (ref 8.4–10.5)
Chloride: 100 mEq/L (ref 96–112)
Creat: 0.82 mg/dL (ref 0.50–1.10)
Glucose, Bld: 106 mg/dL — ABNORMAL HIGH (ref 70–99)
Potassium: 4 mEq/L (ref 3.5–5.3)
Sodium: 137 mEq/L (ref 135–145)
Total Bilirubin: 0.5 mg/dL (ref 0.2–1.2)
Total Protein: 6.4 g/dL (ref 6.0–8.3)

## 2013-07-09 LAB — URINE CULTURE: Colony Count: 4000

## 2013-07-10 LAB — CYTOLOGY - PAP

## 2013-08-17 ENCOUNTER — Other Ambulatory Visit: Payer: Self-pay

## 2013-08-17 DIAGNOSIS — Z1231 Encounter for screening mammogram for malignant neoplasm of breast: Secondary | ICD-10-CM

## 2013-09-25 ENCOUNTER — Encounter (INDEPENDENT_AMBULATORY_CARE_PROVIDER_SITE_OTHER): Payer: Self-pay

## 2013-09-25 ENCOUNTER — Ambulatory Visit
Admission: RE | Admit: 2013-09-25 | Discharge: 2013-09-25 | Disposition: A | Discharge status: Home / Self Care | Payer: BC Managed Care – PPO | Source: Ambulatory Visit

## 2013-09-25 DIAGNOSIS — Z1231 Encounter for screening mammogram for malignant neoplasm of breast: Secondary | ICD-10-CM

## 2013-11-27 ENCOUNTER — Other Ambulatory Visit: Payer: Self-pay | Admitting: Dermatology

## 2013-11-27 ENCOUNTER — Encounter: Payer: Self-pay | Admitting: Women's Health

## 2014-03-15 ENCOUNTER — Encounter: Payer: Self-pay | Admitting: Women's Health

## 2014-03-15 ENCOUNTER — Ambulatory Visit (INDEPENDENT_AMBULATORY_CARE_PROVIDER_SITE_OTHER): Payer: BLUE CROSS/BLUE SHIELD | Admitting: Women's Health

## 2014-03-15 VITALS — BP 120/78 | Ht 63.0 in | Wt 142.0 lb

## 2014-03-15 DIAGNOSIS — Z79890 Hormone replacement therapy (postmenopausal): Secondary | ICD-10-CM

## 2014-03-15 MED ORDER — ESTRADIOL 0.05 MG/24HR TD PTWK: 0.0500 mg | MEDICATED_PATCH | TRANSDERMAL | Status: DC

## 2014-03-15 MED ORDER — PROGESTERONE MICRONIZED 200 MG PO CAPS: 200.0000 mg | ORAL_CAPSULE | Freq: Every day | ORAL | Status: DC

## 2014-03-15 NOTE — Patient Instructions (Signed)

## 2014-03-15 NOTE — Progress Notes (Signed)
Patient ID: Sarah Jimenez, female   DOB: 10-Feb-1960, 54 y.o.   MRN: [id number] Presents with menopausal symptoms of poor sleep, vaginal dryness, weight gain, no energy and generally not feeling well. Postmenopausal one year, states symptoms are increasingly more bothersome. Partner vasectomy. Having increased home stressors of finances, children, contemplating selling her home and work.  Exam: Appears well. Eye contact good, affect somewhat flat.   Menopausal symptoms  Plan: Options reviewed, would like to try HRT, reviewed risks of blood clots, strokes, breast cancer. No known risks. Climara 0.05 patch weekly, Prometrium 200 at bedtime day 1 through 12 of each month. Prescription, proper use given and reviewed. Encouraged increased exercise, decreasing carbs in diet for weight loss. Instructed to call if no relief of symptoms in one month. Will review at annual exam in June.

## 2014-04-12 ENCOUNTER — Telehealth: Payer: Self-pay | Admitting: *Deleted

## 2014-04-12 NOTE — Telephone Encounter (Signed)
Pt was prescribed HRT Climara 0.05 patch weekly, Prometrium 200 day 1-12 of each month. Pt said she noticed some spotting after taking progesterone if this was normal. Pt informed this is okay, should not bleeding heavy (ex: changing pad/tampon every 1 hour) pt was fine with this and will follow up if needed.

## 2014-07-11 ENCOUNTER — Encounter: Payer: Self-pay | Admitting: Women's Health

## 2014-07-11 ENCOUNTER — Ambulatory Visit (INDEPENDENT_AMBULATORY_CARE_PROVIDER_SITE_OTHER): Payer: BLUE CROSS/BLUE SHIELD | Admitting: Women's Health

## 2014-07-11 VITALS — BP 122/80 | Ht 63.0 in | Wt 140.0 lb

## 2014-07-11 DIAGNOSIS — Z01419 Encounter for gynecological examination (general) (routine) without abnormal findings: Secondary | ICD-10-CM

## 2014-07-11 DIAGNOSIS — F4322 Adjustment disorder with anxiety: Secondary | ICD-10-CM | POA: Diagnosis not present

## 2014-07-11 DIAGNOSIS — Z833 Family history of diabetes mellitus: Secondary | ICD-10-CM

## 2014-07-11 DIAGNOSIS — Z13220 Encounter for screening for lipoid disorders: Secondary | ICD-10-CM

## 2014-07-11 DIAGNOSIS — A609 Anogenital herpesviral infection, unspecified: Secondary | ICD-10-CM | POA: Diagnosis not present

## 2014-07-11 LAB — COMPREHENSIVE METABOLIC PANEL
ALT: 14 U/L (ref 0–35)
AST: 17 U/L (ref 0–37)
Albumin: 4.2 g/dL (ref 3.5–5.2)
Alkaline Phosphatase: 63 U/L (ref 39–117)
BUN: 16 mg/dL (ref 6–23)
CO2: 26 mEq/L (ref 19–32)
Calcium: 9.6 mg/dL (ref 8.4–10.5)
Chloride: 103 mEq/L (ref 96–112)
Creat: 0.77 mg/dL (ref 0.50–1.10)
Glucose, Bld: 83 mg/dL (ref 70–99)
Potassium: 4.1 mEq/L (ref 3.5–5.3)
Sodium: 139 mEq/L (ref 135–145)
Total Bilirubin: 0.4 mg/dL (ref 0.2–1.2)
Total Protein: 7.1 g/dL (ref 6.0–8.3)

## 2014-07-11 LAB — LIPID PANEL
Cholesterol: 178 mg/dL (ref 0–200)
HDL: 72 mg/dL (ref >=46)
LDL Cholesterol: 82 mg/dL (ref 0–99)
Total CHOL/HDL Ratio: 2.5 Ratio
Triglycerides: 119 mg/dL (ref <150)
VLDL: 24 mg/dL (ref 0–40)

## 2014-07-11 LAB — CBC WITH DIFFERENTIAL/PLATELET
Basophils Absolute: 0.1 10*3/uL (ref 0.0–0.1)
Basophils Relative: 1 % (ref 0–1)
Eosinophils Absolute: 0.1 10*3/uL (ref 0.0–0.7)
Eosinophils Relative: 2 % (ref 0–5)
HCT: 40.5 % (ref 36.0–46.0)
Hemoglobin: 14 g/dL (ref 12.0–15.0)
Lymphocytes Relative: 29 % (ref 12–46)
Lymphs Abs: 1.8 10*3/uL (ref 0.7–4.0)
MCH: 32.3 pg (ref 26.0–34.0)
MCHC: 34.6 g/dL (ref 30.0–36.0)
MCV: 93.3 fL (ref 78.0–100.0)
MPV: 10.3 fL (ref 8.6–12.4)
Monocytes Absolute: 0.4 10*3/uL (ref 0.1–1.0)
Monocytes Relative: 7 % (ref 3–12)
Neutro Abs: 3.8 10*3/uL (ref 1.7–7.7)
Neutrophils Relative %: 61 % (ref 43–77)
Platelets: 336 10*3/uL (ref 150–400)
RBC: 4.34 MIL/uL (ref 3.87–5.11)
RDW: 13.5 % (ref 11.5–15.5)
WBC: 6.2 10*3/uL (ref 4.0–10.5)

## 2014-07-11 LAB — HEMOGLOBIN A1C
Hgb A1c MFr Bld: 5.8 % — ABNORMAL HIGH (ref <5.7)
Mean Plasma Glucose: 120 mg/dL — ABNORMAL HIGH (ref <117)

## 2014-07-11 MED ORDER — ALPRAZOLAM 0.25 MG PO TABS: ORAL_TABLET | ORAL | Status: DC

## 2014-07-11 MED ORDER — ACYCLOVIR 200 MG PO CAPS: 200.0000 mg | ORAL_CAPSULE | ORAL | Status: DC

## 2014-07-11 NOTE — Patient Instructions (Signed)
Health Recommendations for Postmenopausal Women Respected and ongoing research has looked at the most common causes of death, disability, and poor quality of life in postmenopausal women. The causes include heart disease, diseases of blood vessels, diabetes, depression, cancer, and bone loss (osteoporosis). Many things can be done to help lower the chances of developing these and other common problems. CARDIOVASCULAR DISEASE Heart Disease: A heart attack is a medical emergency. Know the signs and symptoms of a heart attack. Below are things women can do to reduce their risk for heart disease.   Do not smoke. If you smoke, quit.  Aim for a healthy weight. Being overweight causes many preventable deaths. Eat a healthy and balanced diet and drink an adequate amount of liquids.  Get moving. Make a commitment to be more physically active. Aim for 30 minutes of activity on most, if not all days of the week.  Eat for heart health. Choose a diet that is low in saturated fat and cholesterol and eliminate trans fat. Include whole grains, vegetables, and fruits. Read and understand the labels on food containers before buying.  Know your numbers. Ask your caregiver to check your blood pressure, cholesterol (total, HDL, LDL, triglycerides) and blood glucose. Work with your caregiver on improving your entire clinical picture.  High blood pressure. Limit or stop your table salt intake (try salt substitute and food seasonings). Avoid salty foods and drinks. Read labels on food containers before buying. Eating well and exercising can help control high blood pressure. STROKE  Stroke is a medical emergency. Stroke may be the result of a blood clot in a blood vessel in the brain or by a brain hemorrhage (bleeding). Know the signs and symptoms of a stroke. To lower the risk of developing a stroke:  Avoid fatty foods.  Quit smoking.  Control your diabetes, blood pressure, and irregular heart rate. THROMBOPHLEBITIS  (BLOOD CLOT) OF THE LEG  Becoming overweight and leading a stationary lifestyle may also contribute to developing blood clots. Controlling your diet and exercising will help lower the risk of developing blood clots. CANCER SCREENING  Breast Cancer: Take steps to reduce your risk of breast cancer.  You should practice "breast self-awareness." This means understanding the normal appearance and feel of your breasts and should include breast self-examination. Any changes detected, no matter how small, should be reported to your caregiver.  After age 40, you should have a clinical breast exam (CBE) every year.  Starting at age 40, you should consider having a mammogram (breast X-ray) every year.  If you have a family history of breast cancer, talk to your caregiver about genetic screening.  If you are at high risk for breast cancer, talk to your caregiver about having an MRI and a mammogram every year.  Intestinal or Stomach Cancer: Tests to consider are a rectal exam, fecal occult blood, sigmoidoscopy, and colonoscopy. Women who are high risk may need to be screened at an earlier age and more often.  Cervical Cancer:  Beginning at age 30, you should have a Pap test every 3 years as long as the past 3 Pap tests have been normal.  If you have had past treatment for cervical cancer or a condition that could lead to cancer, you need Pap tests and screening for cancer for at least 20 years after your treatment.  If you had a hysterectomy for a problem that was not cancer or a condition that could lead to cancer, then you no longer need Pap tests.    If you are between ages 65 and 70, and you have had normal Pap tests going back 10 years, you no longer need Pap tests.  If Pap tests have been discontinued, risk factors (such as a new sexual partner) need to be reassessed to determine if screening should be resumed.  Some medical problems can increase the chance of getting cervical cancer. In these  cases, your caregiver may recommend more frequent screening and Pap tests.  Uterine Cancer: If you have vaginal bleeding after reaching menopause, you should notify your caregiver.  Ovarian Cancer: Other than yearly pelvic exams, there are no reliable tests available to screen for ovarian cancer at this time except for yearly pelvic exams.  Lung Cancer: Yearly chest X-rays can detect lung cancer and should be done on high risk women, such as cigarette smokers and women with chronic lung disease (emphysema).  Skin Cancer: A complete body skin exam should be done at your yearly examination. Avoid overexposure to the sun and ultraviolet light lamps. Use a strong sun block cream when in the sun. All of these things are important for lowering the risk of skin cancer. MENOPAUSE Menopause Symptoms: Hormone therapy products are effective for treating symptoms associated with menopause:  Moderate to severe hot flashes.  Night sweats.  Mood swings.  Headaches.  Tiredness.  Loss of sex drive.  Insomnia.  Other symptoms. Hormone replacement carries certain risks, especially in older women. Women who use or are thinking about using estrogen or estrogen with progestin treatments should discuss that with their caregiver. Your caregiver will help you understand the benefits and risks. The ideal dose of hormone replacement therapy is not known. The Food and Drug Administration (FDA) has concluded that hormone therapy should be used only at the lowest doses and for the shortest amount of time to reach treatment goals.  OSTEOPOROSIS Protecting Against Bone Loss and Preventing Fracture If you use hormone therapy for prevention of bone loss (osteoporosis), the risks for bone loss must outweigh the risk of the therapy. Ask your caregiver about other medications known to be safe and effective for preventing bone loss and fractures. To guard against bone loss or fractures, the following is recommended:  If  you are younger than age 50, take 1000 mg of calcium and at least 600 mg of Vitamin D per day.  If you are older than age 50 but younger than age 70, take 1200 mg of calcium and at least 600 mg of Vitamin D per day.  If you are older than age 70, take 1200 mg of calcium and at least 800 mg of Vitamin D per day. Smoking and excessive alcohol intake increases the risk of osteoporosis. Eat foods rich in calcium and vitamin D and do weight bearing exercises several times a week as your caregiver suggests. DIABETES Diabetes Mellitus: If you have type I or type 2 diabetes, you should keep your blood sugar under control with diet, exercise, and recommended medication. Avoid starchy and fatty foods, and too many sweets. Being overweight can make diabetes control more difficult. COGNITION AND MEMORY Cognition and Memory: Menopausal hormone therapy is not recommended for the prevention of cognitive disorders such as Alzheimer's disease or memory loss.  DEPRESSION  Depression may occur at any age, but it is common in elderly women. This may be because of physical, medical, social (loneliness), or financial problems and needs. If you are experiencing depression because of medical problems and control of symptoms, talk to your caregiver about this. Physical   activity and exercise may help with mood and sleep. Community and volunteer involvement may improve your sense of value and worth. If you have depression and you feel that the problem is getting worse or becoming severe, talk to your caregiver about which treatment options are best for you. ACCIDENTS  Accidents are common and can be serious in elderly woman. Prepare your house to prevent accidents. Eliminate throw rugs, place hand bars in bath, shower, and toilet areas. Avoid wearing high heeled shoes or walking on wet, snowy, and icy areas. Limit or stop driving if you have vision or hearing problems, or if you feel you are unsteady with your movements and  reflexes. HEPATITIS C Hepatitis C is a type of viral infection affecting the liver. It is spread mainly through contact with blood from an infected person. It can be treated, but if left untreated, it can lead to severe liver damage over the years. Many people who are infected do not know that the virus is in their blood. If you are a "baby-boomer", it is recommended that you have one screening test for Hepatitis C. IMMUNIZATIONS  Several immunizations are important to consider having during your senior years, including:   Tetanus, diphtheria, and pertussis booster shot.  Influenza every year before the flu season begins.  Pneumonia vaccine.  Shingles vaccine.  Others, as indicated based on your specific needs. Talk to your caregiver about these. Document Released: 03/06/2005 Document Revised: 05/29/2013 Document Reviewed: 10/31/2007 ExitCare Patient Information 2015 [employer]. This information is not intended to replace advice given to you by your health care provider. Make sure you discuss any questions you have with your health care provider.  

## 2014-07-11 NOTE — Progress Notes (Signed)
DEZARAI PREW 1960/10/15 [id number]    History:    Presents for annual exam.  Postmenopausal had been on Climara and Prometrium with good relief of sleep and mood changes has now stopped, mood and sleep better. No bleeding. Same partner minimal libido. 2008 ASCUS with negative HR HPV.  History of a right breast cyst has been followed at [facility]. Normal mammograms after. Has not had a DEXA. Basal skin cancer 11/2013. Has had slightly elevated glucose in the past. 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  Past medical history, past surgical history, family history and social history were all reviewed and documented in the EPIC chart. Works from home. 4 children ages 87-15 all doing okay. Father died from pancreatic cancer. Mother healthy.  ROS:  A ROS was performed and pertinent positives and negatives are included.  Exam:  Filed Vitals:   07/11/14 1403  BP: 122/80    General appearance:  Normal Thyroid:  Symmetrical, normal in size, without palpable masses or nodularity. Respiratory  Auscultation:  Clear without wheezing or rhonchi Cardiovascular  Auscultation:  Regular rate, without rubs, murmurs or gallops  Edema/varicosities:  Not grossly evident Abdominal  Soft,nontender, without masses, guarding or rebound.  Liver/spleen:  No organomegaly noted  Hernia:  None appreciated  Skin  Inspection:  Grossly normal   Breasts: Examined lying and sitting.     Right: Without masses, retractions, discharge or axillary adenopathy.     Left: Without masses, retractions, discharge or axillary adenopathy. Gentitourinary   Inguinal/mons:  Normal without inguinal adenopathy  External genitalia:  Normal  BUS/Urethra/Skene's glands:  Normal  Vagina:  Normal  Cervix:  Normal  Uterus:  normal in size, shape and contour.  Midline and mobile  Adnexa/parametria:     Rt: Without masses or tenderness.   Lt: Without masses or tenderness.  Anus and perineum: Normal  Digital rectal exam: Normal sphincter  tone without palpated masses or tenderness  Assessment/Plan:  54 y.o. [name] for annual exam.   Menopausal/no HRT/no bleeding 11/2013 basal skin cancer HSV rare outbreaks  Plan: SBE's, continue annual 3-D tomography history of dense breasts. Continueregular exercise, decrease calories for weight loss (10 pound wt gain in 1 year) calcium rich foods, vitamin D 2000 daily encouraged. CBC, CMP, lipid panel, hemoglobin A1c, UA, Pap normal with negative HR HPV 2015, new screening guidelines reviewed. Continue dermatology checks as recommended. Acyclovir 200 mg daily prescription, proper use given and reviewed.    Huel Cote [facility], 4:49 PM 07/11/2014

## 2014-07-12 LAB — URINALYSIS W MICROSCOPIC + REFLEX CULTURE
Bacteria, UA: NONE SEEN
Bilirubin Urine: NEGATIVE
Casts: NONE SEEN
Crystals: NONE SEEN
Glucose, UA: NEGATIVE mg/dL
Hgb urine dipstick: NEGATIVE
Ketones, ur: NEGATIVE mg/dL
Leukocytes, UA: NEGATIVE
Nitrite: NEGATIVE
Protein, ur: NEGATIVE mg/dL
Specific Gravity, Urine: 1.017 (ref 1.005–1.030)
Squamous Epithelial / LPF: NONE SEEN
Urobilinogen, UA: 0.2 mg/dL (ref 0.0–1.0)
pH: 5 (ref 5.0–8.0)

## 2014-07-12 LAB — VITAMIN D 25 HYDROXY (VIT D DEFICIENCY, FRACTURES): Vit D, 25-Hydroxy: 26 ng/mL — ABNORMAL LOW (ref 30–100)

## 2014-08-29 ENCOUNTER — Other Ambulatory Visit: Payer: Self-pay | Admitting: Women's Health

## 2014-08-29 DIAGNOSIS — Z1231 Encounter for screening mammogram for malignant neoplasm of breast: Secondary | ICD-10-CM

## 2014-09-24 ENCOUNTER — Other Ambulatory Visit: Payer: Self-pay

## 2014-09-24 DIAGNOSIS — A609 Anogenital herpesviral infection, unspecified: Secondary | ICD-10-CM

## 2014-09-24 MED ORDER — ACYCLOVIR 200 MG PO CAPS: 200.0000 mg | ORAL_CAPSULE | ORAL | Status: DC

## 2014-10-09 ENCOUNTER — Ambulatory Visit
Admission: RE | Admit: 2014-10-09 | Discharge: 2014-10-09 | Disposition: A | Discharge status: Home / Self Care | Payer: BLUE CROSS/BLUE SHIELD | Source: Ambulatory Visit | Attending: Women's Health | Admitting: Women's Health

## 2014-10-09 DIAGNOSIS — Z1231 Encounter for screening mammogram for malignant neoplasm of breast: Secondary | ICD-10-CM

## 2014-11-15 ENCOUNTER — Telehealth: Payer: Self-pay | Admitting: *Deleted

## 2014-11-15 NOTE — Telephone Encounter (Signed)
Pt called c/o vaginal bleeding off and on, pt menopausal. Transferred to front desk.

## 2014-11-21 ENCOUNTER — Encounter: Payer: Self-pay | Admitting: Women's Health

## 2014-11-21 ENCOUNTER — Ambulatory Visit (INDEPENDENT_AMBULATORY_CARE_PROVIDER_SITE_OTHER): Payer: BLUE CROSS/BLUE SHIELD | Admitting: Women's Health

## 2014-11-21 VITALS — BP 128/74 | Ht 62.0 in | Wt 141.0 lb

## 2014-11-21 DIAGNOSIS — N950 Postmenopausal bleeding: Secondary | ICD-10-CM

## 2014-11-21 NOTE — Patient Instructions (Signed)
Postmenopausal Bleeding Postmenopausal bleeding is any bleeding a woman has after she has entered into menopause. Menopause is the end of a woman's fertile years. After menopause, a woman no longer ovulates or has menstrual periods.  Postmenopausal bleeding can be caused by various things. Any type of postmenopausal bleeding, even if it appears to be a typical menstrual period, is concerning. This should be evaluated by your health care provider. Any treatment will depend on the cause of the bleeding. HOME CARE INSTRUCTIONS Monitor your condition for any changes. The following actions may help to alleviate any discomfort you are experiencing:  Avoid the use of tampons and douches as directed by your health care provider.  Change your pads frequently.  Get regular pelvic exams and Pap tests.  Keep all follow-up appointments for diagnostic tests as directed by your health care provider. SEEK MEDICAL CARE IF:   Your bleeding lasts more than 1 week.  You have abdominal pain.  You have bleeding with sexual intercourse. SEEK IMMEDIATE MEDICAL CARE IF:   You have a fever, chills, headache, dizziness, muscle aches, and bleeding.  You have severe pain with bleeding.  You are passing blood clots.  You have bleeding and need more than 1 pad an hour.  You feel faint. MAKE SURE YOU:  Understand these instructions.  Will watch your condition.  Will get help right away if you are not doing well or get worse.   This information is not intended to replace advice given to you by your health care provider. Make sure you discuss any questions you have with your health care provider.   Document Released: 04/22/2005 Document Revised: 11/02/2012 Document Reviewed: 08/11/2012 Elsevier Interactive Patient Education 2016 Elsevier Inc.  

## 2014-11-21 NOTE — Progress Notes (Signed)
Presents today for irregular cycles. February 2016 was having numerous hot flashes, poor sleep and was amenorrheic almost 1 year and started on HRT was on Prometrin and Climara until April 2016, no bleeding on HRT. Experienced a 4-5 day  "normal" cycle in August and one in October, typical features of previous cycles. Is unsure if she ever went an entire year without cycles. Admits to decreased sleep, increased vaginal dryness, and thinning of hair, hot flashes less. Denies abdominal pain, intermittent spotting, fever, or urinary issues. Recently broke up with partner of eight years, no infidelity.   Exam: Appears well. External genitalia within normal limits. Speculum exam reveals no discharge, erythema, inflammation, or lesions in vaginal canal. Cervix is within normal limits, no polyps visualized. No pain with exam.  Perimenopausal/Irregular cycles  Plan: [place]. If elevated will schedule sonohysterogram with Dr. Toney Rakes. Menopause reviewed, Encouraged to continue to monitor cycles. Given material on postmenopausal bleeding. Call if questions/concerns.

## 2014-11-22 ENCOUNTER — Other Ambulatory Visit: Payer: Self-pay | Admitting: *Deleted

## 2014-11-22 DIAGNOSIS — N950 Postmenopausal bleeding: Secondary | ICD-10-CM

## 2014-11-22 LAB — FOLLICLE STIMULATING HORMONE: FSH: 63.1 m[IU]/mL

## 2014-11-26 ENCOUNTER — Telehealth: Payer: Self-pay | Admitting: *Deleted

## 2014-11-26 NOTE — Telephone Encounter (Signed)
Ok for refill? 

## 2014-11-26 NOTE — Telephone Encounter (Signed)
Pt called requesting refill on Xanax 0.25 mg takes for sleep, last filled in June with 1 refill. Please advise

## 2014-11-27 MED ORDER — ALPRAZOLAM 0.25 MG PO TABS: 0.2500 mg | ORAL_TABLET | Freq: Every evening | ORAL | Status: DC | PRN

## 2014-11-27 NOTE — Telephone Encounter (Signed)
Rx called in pt aware

## 2014-11-29 ENCOUNTER — Other Ambulatory Visit: Payer: Self-pay | Admitting: Gynecology

## 2014-11-29 ENCOUNTER — Telehealth: Payer: Self-pay | Admitting: Gynecology

## 2014-11-29 DIAGNOSIS — N950 Postmenopausal bleeding: Secondary | ICD-10-CM

## 2014-11-29 NOTE — Telephone Encounter (Signed)
11/29/14-Pt was advised today that her [facility] ins is applying cost of sonohysterogram towards her deductible. There is a remaining balance of $1754.91 and the allowable for the test is $851.58. She cut me off so I was not able to let her know if bx needed it will be an additional $269.62. She will pay this DOS. Spoke with Tim @ [facility], [phone].wl

## 2014-12-07 ENCOUNTER — Ambulatory Visit (INDEPENDENT_AMBULATORY_CARE_PROVIDER_SITE_OTHER): Payer: BLUE CROSS/BLUE SHIELD

## 2014-12-07 ENCOUNTER — Other Ambulatory Visit: Payer: Self-pay | Admitting: Gynecology

## 2014-12-07 ENCOUNTER — Ambulatory Visit (INDEPENDENT_AMBULATORY_CARE_PROVIDER_SITE_OTHER): Payer: BLUE CROSS/BLUE SHIELD | Admitting: Gynecology

## 2014-12-07 ENCOUNTER — Encounter: Payer: Self-pay | Admitting: Gynecology

## 2014-12-07 DIAGNOSIS — N9489 Other specified conditions associated with female genital organs and menstrual cycle: Secondary | ICD-10-CM

## 2014-12-07 DIAGNOSIS — N950 Postmenopausal bleeding: Secondary | ICD-10-CM

## 2014-12-07 DIAGNOSIS — N840 Polyp of corpus uteri: Secondary | ICD-10-CM

## 2014-12-07 NOTE — Progress Notes (Signed)
   Patient is a 54 year old who presented to the office today as part of her evaluation for postmenopausal bleeding. She was seen on 11/21/2014 by her nurse practitioner Ms. Elon Alas. Patient states that her vasomotor symptoms are very far in between. She had been at one time on Climara transdermal patch with the addition of Prometrium for 10 days of the month. She had not had any bleeding while on hormone replacement therapy. She didn't stopped in April her hormone replacement therapy and had bleeding for 5 days on August 4 and then again for 5 days starting on October 14. She states her hot flashes are very far in between. She did have an [facility] in October 2016 when she had been off hormone replacement therapy and indeed it was indicated that she was in the menopausal state with a value of 54.  Ultrasound/sono hysterogram today: Uterus measured 8.6 x 7.1 x 5.3 cm with endometrial stripe 14 mm. Right and left ovary were normal atrophic no adnexal masses. After the cervix was cleansed with Betadine solution a sterile catheter was introduced into the uterine cavity normal saline was instilled a 26 x 25 x 14 mm endometrial polyp was seen along with a 12 x 7 x 9 mm endometrial polyp seen as well.  Assessment/plan: 54 year old patient off hormone replacement therapy with postmenopausal bleeding sonohysterogram today demonstrated 2 endometrial polyps. Patient was provided with literature information on resectoscopic polypectomy. Her surgery will be scheduled for sometime before the end of the year. We'll see her 1 week prior to surgery for postop examination consultation.

## 2014-12-10 ENCOUNTER — Telehealth: Payer: Self-pay

## 2014-12-10 NOTE — Telephone Encounter (Signed)
Patient called. Surgery scheduled for Dec 6 at [facility]. We discussed ins benefits and estimated financial responsibility to [facility] and letter will be sent. Consult scheduled with Dr. Moshe Salisbury as well.

## 2014-12-10 NOTE — Telephone Encounter (Signed)
Left message to call me to schedule.

## 2014-12-11 ENCOUNTER — Encounter: Payer: Self-pay | Admitting: Gynecology

## 2014-12-13 ENCOUNTER — Encounter (HOSPITAL_COMMUNITY): Payer: Self-pay

## 2014-12-28 ENCOUNTER — Encounter: Payer: Self-pay | Admitting: Gynecology

## 2014-12-28 ENCOUNTER — Ambulatory Visit (INDEPENDENT_AMBULATORY_CARE_PROVIDER_SITE_OTHER): Payer: BLUE CROSS/BLUE SHIELD | Admitting: Gynecology

## 2014-12-28 VITALS — BP 124/80 | Ht 63.0 in | Wt 142.0 lb

## 2014-12-28 DIAGNOSIS — N840 Polyp of corpus uteri: Secondary | ICD-10-CM | POA: Diagnosis not present

## 2014-12-28 DIAGNOSIS — Z01818 Encounter for other preprocedural examination: Secondary | ICD-10-CM | POA: Diagnosis not present

## 2014-12-28 DIAGNOSIS — N950 Postmenopausal bleeding: Secondary | ICD-10-CM | POA: Diagnosis not present

## 2014-12-28 NOTE — Progress Notes (Signed)
Sarah Jimenez is an 54 y.o. female who presented to the office today for preoperative examination consultation. Patient scheduled for resectoscopic polypectomy next week. Her history is as follows:  Patient with postmenopausal bleeding off hormone replacement therapy had an ultrasound and sonohysterogram on 12/07/2014 which demonstrated the following: Uterus measured 8.6 x 7.1 x 5.3 cm with endometrial stripe 14 mm. Right and left ovary were normal atrophic no adnexal masses. After the cervix was cleansed with Betadine solution a sterile catheter was introduced into the uterine cavity normal saline was instilled a 26 x 25 x 14 mm endometrial polyp was seen along with a 12 x 7 x 9 mm endometrial polyp seen as well.  Pertinent Gynecological History: Menses: post-menopausal Bleeding: post menopausal bleeding Contraception: post menopausal status DES exposure: denies Blood transfusions: none Sexually transmitted diseases: HSV Previous GYN Procedures: cryo  Last mammogram: normal Date: 2016 Last pap: normal Date: 2016 OB History: G 4, P 4   Menstrual History: Menarche age: 36 No LMP recorded. Patient is not currently having periods (Reason: Perimenopausal).    Past Medical History  Diagnosis Date  . HSV-2 (herpes simplex virus 2) infection     history of  . Heart murmur   . Cancer [facility])     Pre cancer skin    Past Surgical History  Procedure Laterality Date  . Pyloric stenosis  1962    Family History  Problem Relation Age of Onset  . Hypertension Father   . Cancer Father 34    pancreatic  . Hypertension Brother   . Heart disease Maternal Grandfather   . Colon cancer Paternal Uncle 61  . Stomach cancer Neg Hx   . Esophageal cancer Neg Hx     Social History:  reports that she has never smoked. She has never used smokeless tobacco. She reports that she drinks about 1.2 oz of alcohol per week. She reports that she uses illicit drugs.  Allergies: No Known  Allergies   (Not in a hospital admission)  REVIEW OF SYSTEMS: A ROS was performed and pertinent positives and negatives are included in the history.  GENERAL: No fevers or chills. HEENT: No change in vision, no earache, sore throat or sinus congestion. NECK: No pain or stiffness. CARDIOVASCULAR: No chest pain or pressure. No palpitations. PULMONARY: No shortness of breath, cough or wheeze. GASTROINTESTINAL: No abdominal pain, nausea, vomiting or diarrhea, melena or bright red blood per rectum. GENITOURINARY: No urinary frequency, urgency, hesitancy or dysuria. MUSCULOSKELETAL: No joint or muscle pain, no back pain, no recent trauma. DERMATOLOGIC: No rash, no itching, no lesions. ENDOCRINE: No polyuria, polydipsia, no heat or cold intolerance. No recent change in weight. HEMATOLOGICAL: No anemia or easy bruising or bleeding. NEUROLOGIC: No headache, seizures, numbness, tingling or weakness. PSYCHIATRIC: No depression, no loss of interest in normal activity or change in sleep pattern.     Blood pressure 124/80, height 5\' 3"  (1.6 m), weight 142 lb (64.411 kg).  Physical Exam:  HEENT:unremarkable Neck:Supple, midline, no thyroid megaly, no carotid bruits Lungs:  Clear to auscultation no rhonchi's or wheezes Heart:Regular rate and rhythm, no murmurs or gallops Breast Exam: Symmetrical in appearance no skin discoloration or nipple inversion no palpable masses or tenderness no supraclavicular axillary lymphadenopathy Abdomen: Soft nontender no rebound or guarding midepigastric scar from previous pyloromyotomy at a young age Pelvic:BUS within normal limits Vagina: No lesions or discharge Cervix: No lesions or discharge Uterus: Retroverted normal size shape and consistency Adnexa: No palpable masses or tenderness  Extremities: No cords, no edema Rectal: Not examined  Assessment/Plan: Scheduled for resectoscopic polypectomy next week to is a result of postmenopausal bleeding attributed to  endometrial polyps. The following risk of surgery discussed:                        Patient was counseled as to the risk of surgery to include the following:  1. Infection (prohylactic antibiotics will be administered)  2. DVT/Pulmonary Embolism (prophylactic pneumo compression stockings will be used)  3.Trauma to internal organs requiring additional surgical procedure to repair any injury to     Internal organs requiring perhaps additional hospitalization days.  4.Hemmorhage requiring transfusion and blood products which carry risks such as             anaphylactic reaction, hepatitis and AIDS  Patient had received literature information on the procedure scheduled and all her questions were answered and fully accepts all risk.   [facility] HMD1:49 PMTD@Note : This dictation was prepared with  Dragon/digital dictation along withSmart phrase technology. Any transcriptional errors that result from this process are unintentional.

## 2014-12-31 MED ORDER — DEXTROSE 5 % IV SOLN
2.0000 g | INTRAVENOUS | Status: AC
  Administered 2015-01-01: 2 g via INTRAVENOUS
  Filled 2014-12-31: qty 2

## 2015-01-01 ENCOUNTER — Ambulatory Visit (HOSPITAL_COMMUNITY)
Admission: RE | Admit: 2015-01-01 | Discharge: 2015-01-01 | Disposition: A | Discharge status: Home / Self Care | Payer: BLUE CROSS/BLUE SHIELD | Source: Ambulatory Visit | Attending: Gynecology | Admitting: Gynecology

## 2015-01-01 ENCOUNTER — Ambulatory Visit (HOSPITAL_COMMUNITY): Payer: BLUE CROSS/BLUE SHIELD | Admitting: Anesthesiology

## 2015-01-01 ENCOUNTER — Encounter (HOSPITAL_COMMUNITY)
Admission: RE | Disposition: A | Discharge status: Home / Self Care | Payer: Self-pay | Source: Ambulatory Visit | Attending: Gynecology

## 2015-01-01 ENCOUNTER — Encounter (HOSPITAL_COMMUNITY): Payer: Self-pay | Admitting: Emergency Medicine

## 2015-01-01 DIAGNOSIS — D250 Submucous leiomyoma of uterus: Secondary | ICD-10-CM | POA: Diagnosis not present

## 2015-01-01 DIAGNOSIS — N840 Polyp of corpus uteri: Secondary | ICD-10-CM | POA: Diagnosis not present

## 2015-01-01 DIAGNOSIS — Z79890 Hormone replacement therapy (postmenopausal): Secondary | ICD-10-CM | POA: Insufficient documentation

## 2015-01-01 DIAGNOSIS — N950 Postmenopausal bleeding: Secondary | ICD-10-CM | POA: Insufficient documentation

## 2015-01-01 HISTORY — PX: DILATATION & CURETTAGE/HYSTEROSCOPY WITH MYOSURE: SHX6511

## 2015-01-01 HISTORY — DX: Malignant (primary) neoplasm, unspecified: C80.1

## 2015-01-01 LAB — CBC
HCT: 41.2 % (ref 36.0–46.0)
Hemoglobin: 14.2 g/dL (ref 12.0–15.0)
MCH: 32.6 pg (ref 26.0–34.0)
MCHC: 34.5 g/dL (ref 30.0–36.0)
MCV: 94.7 fL (ref 78.0–100.0)
Platelets: 332 10*3/uL (ref 150–400)
RBC: 4.35 MIL/uL (ref 3.87–5.11)
RDW: 12.1 % (ref 11.5–15.5)
WBC: 4.6 10*3/uL (ref 4.0–10.5)

## 2015-01-01 SURGERY — DILATATION & CURETTAGE/HYSTEROSCOPY WITH MYOSURE
Anesthesia: General | Site: Vagina

## 2015-01-01 MED ORDER — KETOROLAC TROMETHAMINE 30 MG/ML IJ SOLN: 30.0000 mg | Freq: Once | INTRAMUSCULAR | Status: DC | PRN

## 2015-01-01 MED ORDER — MIDAZOLAM HCL 5 MG/5ML IJ SOLN
INTRAMUSCULAR | Status: DC | PRN
  Administered 2015-01-01: 2 mg via INTRAVENOUS

## 2015-01-01 MED ORDER — LIDOCAINE HCL (CARDIAC) 20 MG/ML IV SOLN
INTRAVENOUS | Status: AC
  Filled 2015-01-01: qty 5

## 2015-01-01 MED ORDER — PROPOFOL 10 MG/ML IV BOLUS
INTRAVENOUS | Status: AC
  Filled 2015-01-01: qty 20

## 2015-01-01 MED ORDER — MIDAZOLAM HCL 2 MG/2ML IJ SOLN
INTRAMUSCULAR | Status: AC
  Filled 2015-01-01: qty 2

## 2015-01-01 MED ORDER — ONDANSETRON HCL 4 MG/2ML IJ SOLN
INTRAMUSCULAR | Status: AC
  Filled 2015-01-01: qty 2

## 2015-01-01 MED ORDER — ONDANSETRON HCL 4 MG/2ML IJ SOLN
INTRAMUSCULAR | Status: DC | PRN
  Administered 2015-01-01: 4 mg via INTRAVENOUS

## 2015-01-01 MED ORDER — FENTANYL CITRATE (PF) 100 MCG/2ML IJ SOLN: 25.0000 ug | INTRAMUSCULAR | Status: DC | PRN

## 2015-01-01 MED ORDER — PROPOFOL 10 MG/ML IV BOLUS
INTRAVENOUS | Status: DC | PRN
  Administered 2015-01-01: 200 mg via INTRAVENOUS

## 2015-01-01 MED ORDER — LACTATED RINGERS IV SOLN
INTRAVENOUS | Status: DC
  Administered 2015-01-01: 09:00:00 via INTRAVENOUS

## 2015-01-01 MED ORDER — SILVER NITRATE-POT NITRATE 75-25 % EX MISC
CUTANEOUS | Status: AC
  Filled 2015-01-01: qty 1

## 2015-01-01 MED ORDER — SODIUM CHLORIDE 0.9 % IR SOLN
Status: DC | PRN
  Administered 2015-01-01: 3000 mL

## 2015-01-01 MED ORDER — KETOROLAC TROMETHAMINE 30 MG/ML IJ SOLN
INTRAMUSCULAR | Status: AC
  Filled 2015-01-01: qty 1

## 2015-01-01 MED ORDER — MEPERIDINE HCL 25 MG/ML IJ SOLN: 6.2500 mg | INTRAMUSCULAR | Status: DC | PRN

## 2015-01-01 MED ORDER — VASOPRESSIN 20 UNIT/ML IV SOLN
INTRAVENOUS | Status: DC | PRN
Start: 2015-01-01 — End: 2015-01-01
  Administered 2015-01-01: 10 [IU]

## 2015-01-01 MED ORDER — DEXAMETHASONE SODIUM PHOSPHATE 4 MG/ML IJ SOLN
INTRAMUSCULAR | Status: DC | PRN
  Administered 2015-01-01: 10 mg via INTRAVENOUS

## 2015-01-01 MED ORDER — DEXAMETHASONE SODIUM PHOSPHATE 10 MG/ML IJ SOLN
INTRAMUSCULAR | Status: AC
  Filled 2015-01-01: qty 1

## 2015-01-01 MED ORDER — SCOPOLAMINE 1 MG/3DAYS TD PT72
1.0000 | MEDICATED_PATCH | Freq: Once | TRANSDERMAL | Status: DC
  Administered 2015-01-01: 1.5 mg via TRANSDERMAL

## 2015-01-01 MED ORDER — SODIUM CHLORIDE 0.9 % IJ SOLN
INTRAMUSCULAR | Status: AC
  Filled 2015-01-01: qty 50

## 2015-01-01 MED ORDER — SCOPOLAMINE 1 MG/3DAYS TD PT72
MEDICATED_PATCH | TRANSDERMAL | Status: AC
  Administered 2015-01-01: 1.5 mg via TRANSDERMAL
  Filled 2015-01-01: qty 1

## 2015-01-01 MED ORDER — FENTANYL CITRATE (PF) 100 MCG/2ML IJ SOLN
INTRAMUSCULAR | Status: AC
  Filled 2015-01-01: qty 2

## 2015-01-01 MED ORDER — LIDOCAINE HCL (CARDIAC) 20 MG/ML IV SOLN
INTRAVENOUS | Status: DC | PRN
  Administered 2015-01-01: 100 mg via INTRAVENOUS

## 2015-01-01 MED ORDER — KETOROLAC TROMETHAMINE 30 MG/ML IJ SOLN
INTRAMUSCULAR | Status: DC | PRN
  Administered 2015-01-01: 30 mg via INTRAVENOUS

## 2015-01-01 MED ORDER — VASOPRESSIN 20 UNIT/ML IV SOLN
INTRAVENOUS | Status: AC
  Filled 2015-01-01: qty 1

## 2015-01-01 MED ORDER — FENTANYL CITRATE (PF) 100 MCG/2ML IJ SOLN
INTRAMUSCULAR | Status: DC | PRN
  Administered 2015-01-01 (×2): 50 ug via INTRAVENOUS

## 2015-01-01 MED ORDER — ONDANSETRON HCL 4 MG/2ML IJ SOLN: 4.0000 mg | Freq: Once | INTRAMUSCULAR | Status: DC | PRN

## 2015-01-01 MED ORDER — SODIUM CHLORIDE 0.9 % IJ SOLN
INTRAMUSCULAR | Status: DC | PRN
  Administered 2015-01-01: 30 mL

## 2015-01-01 SURGICAL SUPPLY — 26 items
CANISTERS HI-FLOW 3000CC (CANNISTER) ×2 IMPLANT
CATH FOLEY 2WAY SLVR 30CC 16FR (CATHETERS) IMPLANT
CATH ROBINSON RED A/P 16FR (CATHETERS) ×2 IMPLANT
CLOTH BEACON ORANGE TIMEOUT ST (SAFETY) ×2 IMPLANT
CONTAINER PREFILL 10% NBF 60ML (FORM) ×4 IMPLANT
DEVICE MYOSURE LITE (MISCELLANEOUS) IMPLANT
DEVICE MYOSURE REACH (MISCELLANEOUS) ×1 IMPLANT
FILTER ARTHROSCOPY CONVERTOR (FILTER) ×2 IMPLANT
GLOVE BIOGEL PI IND STRL 7.0 (GLOVE) ×1 IMPLANT
GLOVE BIOGEL PI IND STRL 8 (GLOVE) ×1 IMPLANT
GLOVE BIOGEL PI INDICATOR 7.0 (GLOVE) ×1
GLOVE BIOGEL PI INDICATOR 8 (GLOVE) ×1
GLOVE ECLIPSE 7.5 STRL STRAW (GLOVE) ×4 IMPLANT
GOWN STRL REUS W/TWL LRG LVL3 (GOWN DISPOSABLE) ×4 IMPLANT
MYOSURE XL FIBROID REM (MISCELLANEOUS) ×2
PACK VAGINAL MINOR WOMEN LF (CUSTOM PROCEDURE TRAY) ×2 IMPLANT
PAD OB MATERNITY 4.3X12.25 (PERSONAL CARE ITEMS) ×2 IMPLANT
PAD PREP 24X48 CUFFED NSTRL (MISCELLANEOUS) ×2 IMPLANT
PLUG CATH AND CAP STER (CATHETERS) IMPLANT
SEAL ROD LENS SCOPE MYOSURE (ABLATOR) ×2 IMPLANT
SYR 30ML LL (SYRINGE) IMPLANT
SYSTEM TISS REMOVAL MYSR XL RM (MISCELLANEOUS) IMPLANT
TOWEL OR 17X24 6PK STRL BLUE (TOWEL DISPOSABLE) ×4 IMPLANT
TUBING AQUILEX INFLOW (TUBING) ×2 IMPLANT
TUBING AQUILEX OUTFLOW (TUBING) ×2 IMPLANT
WATER STERILE IRR 1000ML POUR (IV SOLUTION) ×2 IMPLANT

## 2015-01-01 NOTE — H&P (View-Only) (Signed)
   Patient is a 54 year old who presented to the office today as part of her evaluation for postmenopausal bleeding. She was seen on 11/21/2014 by her nurse practitioner Ms. Elon Alas. Patient states that her vasomotor symptoms are very far in between. She had been at one time on Climara transdermal patch with the addition of Prometrium for 10 days of the month. She had not had any bleeding while on hormone replacement therapy. She didn't stopped in April her hormone replacement therapy and had bleeding for 5 days on August 4 and then again for 5 days starting on October 14. She states her hot flashes are very far in between. She did have an [facility] in October 2016 when she had been off hormone replacement therapy and indeed it was indicated that she was in the menopausal state with a value of 63.  Ultrasound/sono hysterogram today: Uterus measured 8.6 x 7.1 x 5.3 cm with endometrial stripe 14 mm. Right and left ovary were normal atrophic no adnexal masses. After the cervix was cleansed with Betadine solution a sterile catheter was introduced into the uterine cavity normal saline was instilled a 26 x 25 x 14 mm endometrial polyp was seen along with a 12 x 7 x 9 mm endometrial polyp seen as well.  Assessment/plan: 54 year old patient off hormone replacement therapy with postmenopausal bleeding sonohysterogram today demonstrated 2 endometrial polyps. Patient was provided with literature information on resectoscopic polypectomy. Her surgery will be scheduled for sometime before the end of the year. We'll see her 1 week prior to surgery for postop examination consultation.

## 2015-01-01 NOTE — Op Note (Signed)
   Operative Note  01/01/2015  9:45 AM  PATIENT:  Sarah Jimenez  54 y.o. female  PRE-OPERATIVE DIAGNOSIS:  endometrial polyp, dysfunctional uterine bleeding  POST-OPERATIVE DIAGNOSIS:  endometrial polyp, dysfunctional uterine bleeding  PROCEDURE:  Procedure(s): DILATATION & CURETTAGE/HYSTEROSCOPY WITH MYOSURE  SURGEON:  Surgeon(s): Terrance Mass, MD  ANESTHESIA:   general  FINDINGS: An endometrial polyp lower uterine segment attached anterior. Behind the polyp was a large submucous myoma encroaching 34 cm endometrial cavity. Both tubal ostia were identified smooth endocervical canal  DESCRIPTION OF OPERATION:FINDINGS: The patient was taken to the operating room where she underwent a successful general endotracheal anesthesia. Patient had PAS stockings for DVT prophylaxis. She received 2 g of Ancef preop. Time out was undertaken to properly identify the patient and the proper operation schedule. The vagina and perineum were prepped and draped in usual sterile fashion. A red rubber Quentin Cornwall was inserted to empty the bladder is content for approximately  25  cc. Bimanual examination demonstrated an anteverted uterus. Patient's legs were in the high lithotomy position. A weighted speculum was placed in the posterior vaginal vault. A single-tooth tenaculum was placed on the anterior cervical lip. The uterus sounded to 7-1/2 cm. Pratt dilator were used to dilate the cervical canal to a  21 mm size. Pitressin was then infiltrated into the cervical stroma at the 2, 4, and a o'clock position for a total 15 cc. The Hologic Myosure resectoscopic morcellator with a scope of 6.25 mm and an operating blade of 3.0 mm was introduced into the intrauterine cavity. 0.9% normal saline was the distending media. Inspection of the endometrial cavity demonstrated a small endometrial polyp from the lower uterine segment attached anteriorly or. Behind it was a larger submucous myoma encroaching 3 forceps and the  uterine cavity requiring the use of the XCEL Myosure morcellator With the resectoscopic morcellator they were removed and submitted for histological evaluation. Fluid deficit was approximately 130 cc.  cc.The single-tooth tenaculum was removed patient was extubated transferred to recovery room stable vital signs blood loss was minimal. She received 30 mg of Toradol in route to the recovery room.Fluid deficit was 130 cc's.    ESTIMATED BLOOD LOSS: Minimal  Intake/Output Summary (Last 24 hours) at 01/01/15 0945 Last data filed at 01/01/15 [id number]  Gross per 24 hour  Intake    600 ml  Output     10 ml  Net    590 ml     BLOOD ADMINISTERED:none   LOCAL MEDICATIONS USED:  OTHER Pitressin 20 units in 30 cc of saline for a total of 15 cc into the cervical stroma at the 2, 4, 8, 10:00 position  SPECIMEN:  Source of Specimen:  Endometrial polyp and submucous myoma from uterus  DISPOSITION OF SPECIMEN:  PATHOLOGY  COUNTS:  YES  PLAN OF CARE: Transfer to PACU  [facility] HMD9:45 AMTD@

## 2015-01-01 NOTE — Anesthesia Postprocedure Evaluation (Signed)
Anesthesia Post Note  Patient: Sarah Jimenez  Procedure(s) Performed: Procedure(s) (LRB): DILATATION & CURETTAGE/HYSTEROSCOPY WITH MYOSURE (N/A)  Patient location during evaluation: PACU Anesthesia Type: General Level of consciousness: awake Pain management: pain level controlled Vital Signs Assessment: post-procedure vital signs reviewed and stable Respiratory status: spontaneous breathing Cardiovascular status: stable Postop Assessment: no signs of nausea or vomiting and adequate PO intake Anesthetic complications: no    Last Vitals:  Filed Vitals:   01/01/15 1045 01/01/15 1127  BP: 104/66 117/65  Pulse: 53 54  Temp: 36.7 C 36.7 C  Resp: 15 16    Last Pain:  Filed Vitals:   01/01/15 1145  PainSc: [place]

## 2015-01-01 NOTE — Discharge Instructions (Signed)

## 2015-01-01 NOTE — Interval H&P Note (Signed)
History and Physical Interval Note:  01/01/2015 8:42 AM  Sarah Jimenez  has presented today for surgery, with the diagnosis of endometrial polyp, dysfunctional uterine bleeding  The various methods of treatment have been discussed with the patient and family. After consideration of risks, benefits and other options for treatment, the patient has consented to  Procedure(s): [place] (N/A) as a surgical intervention .  The patient's history has been reviewed, patient examined, no change in status, stable for surgery.  I have reviewed the patient's chart and labs.  Questions were answered to the patient's satisfaction.     Terrance Mass

## 2015-01-01 NOTE — Anesthesia Procedure Notes (Signed)
Procedure Name: LMA Insertion Date/Time: 01/01/2015 9:03 AM Performed by: Riki Sheer Pre-anesthesia Checklist: Patient identified, Emergency Drugs available, Suction available, Patient being monitored and Timeout performed Patient Re-evaluated:Patient Re-evaluated prior to inductionOxygen Delivery Method: Circle system utilized Preoxygenation: Pre-oxygenation with 100% oxygen Intubation Type: IV induction Ventilation: Mask ventilation without difficulty LMA: LMA inserted LMA Size: 4.0 Number of attempts: 1 Placement Confirmation: positive ETCO2,  CO2 detector and breath sounds checked- equal and bilateral Tube secured with: Tape Dental Injury: Teeth and Oropharynx as per pre-operative assessment

## 2015-01-01 NOTE — Transfer of Care (Signed)
Immediate Anesthesia Transfer of Care Note  Patient: Sarah Jimenez  Procedure(s) Performed: Procedure(s): DILATATION & CURETTAGE/HYSTEROSCOPY WITH MYOSURE (N/A)  Patient Location: PACU  Anesthesia Type:General  Level of Consciousness: awake, alert  and oriented  Airway & Oxygen Therapy: Patient Spontanous Breathing and Patient connected to nasal cannula oxygen  Post-op Assessment: Report given to RN and Post -op Vital signs reviewed and stable  Post vital signs: Reviewed and stable  Last Vitals:  Filed Vitals:   01/01/15 0826  BP: 108/73  Pulse: 71  Temp: 36.8 C  Resp: 16    Complications: No apparent anesthesia complications

## 2015-01-01 NOTE — Anesthesia Preprocedure Evaluation (Signed)
Anesthesia Evaluation  Patient identified by MRN, date of birth, ID band Patient awake    Reviewed: Allergy & Precautions, H&P , NPO status , Patient's Chart, lab work & pertinent test results  Airway Mallampati: I  TM Distance: >3 FB Neck ROM: full    Dental no notable dental hx. (+) Teeth Intact   Pulmonary neg pulmonary ROS,    Pulmonary exam normal       Cardiovascular negative cardio ROS Normal cardiovascular exam    Neuro/Psych negative neurological ROS  negative psych ROS   GI/Hepatic negative GI ROS, Neg liver ROS,   Endo/Other  negative endocrine ROS  Renal/GU negative Renal ROS     Musculoskeletal   Abdominal Normal abdominal exam  (+)   Peds  Hematology negative hematology ROS (+)   Anesthesia Other Findings   Reproductive/Obstetrics negative OB ROS                             Anesthesia Physical Anesthesia Plan  ASA: II  Anesthesia Plan: General   Post-op Pain Management:    Induction: Intravenous  Airway Management Planned: LMA  Additional Equipment:   Intra-op Plan:   Post-operative Plan:   Informed Consent: I have reviewed the patients History and Physical, chart, labs and discussed the procedure including the risks, benefits and alternatives for the proposed anesthesia with the patient or authorized representative who has indicated his/her understanding and acceptance.     Plan Discussed with: CRNA and Surgeon  Anesthesia Plan Comments:         Anesthesia Quick Evaluation  

## 2015-01-02 ENCOUNTER — Encounter (HOSPITAL_COMMUNITY): Payer: Self-pay | Admitting: Gynecology

## 2015-01-18 ENCOUNTER — Ambulatory Visit (INDEPENDENT_AMBULATORY_CARE_PROVIDER_SITE_OTHER): Payer: BLUE CROSS/BLUE SHIELD | Admitting: Gynecology

## 2015-01-18 ENCOUNTER — Encounter: Payer: Self-pay | Admitting: Gynecology

## 2015-01-18 VITALS — BP 120/74 | Wt 132.8 lb

## 2015-01-18 DIAGNOSIS — Z09 Encounter for follow-up examination after completed treatment for conditions other than malignant neoplasm: Secondary | ICD-10-CM

## 2015-01-18 NOTE — Progress Notes (Signed)
   Patient is a 54 year old who on 01/01/2015 underwent a resectoscopic polypectomy/myomectomy as a result of postmenopausal bleeding and uterine polyp and uterine myoma. Patient is doing well no further bleeding reported. Pathology report as well as findings from surgery and picture shared with the patient as follows:  FINDINGS: An endometrial polyp lower uterine segment attached anterior. Behind the polyp was a large submucous myoma encroaching 3/4 of the endometrial cavity. Both tubal ostia were identified smooth endocervical canal  Pathology report: Diagnosis Endometrial polyp, and submucosal myoma - FRAGMENTS OF BENIGN ENDOMETRIAL POLYP, PROLIFERATIVE ENDOMETRIUM AND BENIGN MYOMETRIUM. - NO HYPERPLASIA OR CARCINOMA.  Exam: Abdomen: Soft nontender no rebound or guarding Pelvic: Bartholin urethra Skene was within normal limits Vagina: No lesions or discharge Cervix: No lesions or discharge Uterus: Anteverted normal size shape and consistency Adnexa: No palpable masses or tenderness Rectal exam not done  Assessment/plan: Patient status post resectoscopic polypectomy myomectomy as a result of endometrial polyp and submucous myoma contributing to postmenopausal bleeding. Pathology report benign. Patient doing well. Patient scheduled to return back in 6 months for her annual exam or when necessary. Patient declined flu vaccine.

## 2015-02-19 ENCOUNTER — Other Ambulatory Visit: Payer: Self-pay

## 2015-02-19 MED ORDER — ALPRAZOLAM 0.25 MG PO TABS: 0.2500 mg | ORAL_TABLET | Freq: Every evening | ORAL | Status: DC | PRN

## 2015-02-19 NOTE — Telephone Encounter (Signed)
Okay for refill?  

## 2015-02-19 NOTE — Telephone Encounter (Signed)
Called into pharmacy

## 2015-06-02 DIAGNOSIS — N390 Urinary tract infection, site not specified: Secondary | ICD-10-CM | POA: Diagnosis not present

## 2015-06-02 DIAGNOSIS — R319 Hematuria, unspecified: Secondary | ICD-10-CM | POA: Diagnosis not present

## 2015-06-02 DIAGNOSIS — R300 Dysuria: Secondary | ICD-10-CM | POA: Diagnosis not present

## 2015-06-02 DIAGNOSIS — Z87440 Personal history of urinary (tract) infections: Secondary | ICD-10-CM | POA: Diagnosis not present

## 2015-06-21 DIAGNOSIS — N390 Urinary tract infection, site not specified: Secondary | ICD-10-CM | POA: Diagnosis not present

## 2015-07-04 ENCOUNTER — Encounter: Payer: Self-pay | Admitting: Women's Health

## 2015-07-04 ENCOUNTER — Ambulatory Visit (INDEPENDENT_AMBULATORY_CARE_PROVIDER_SITE_OTHER): Payer: BLUE CROSS/BLUE SHIELD | Admitting: Women's Health

## 2015-07-04 VITALS — BP 124/80 | Ht 63.0 in | Wt 132.0 lb

## 2015-07-04 DIAGNOSIS — N952 Postmenopausal atrophic vaginitis: Secondary | ICD-10-CM | POA: Diagnosis not present

## 2015-07-04 DIAGNOSIS — N3000 Acute cystitis without hematuria: Secondary | ICD-10-CM | POA: Diagnosis not present

## 2015-07-04 DIAGNOSIS — Z01419 Encounter for gynecological examination (general) (routine) without abnormal findings: Secondary | ICD-10-CM | POA: Diagnosis not present

## 2015-07-04 DIAGNOSIS — F4322 Adjustment disorder with anxiety: Secondary | ICD-10-CM

## 2015-07-04 LAB — URINALYSIS W MICROSCOPIC + REFLEX CULTURE
Bilirubin Urine: NEGATIVE
Casts: NONE SEEN [LPF]
Crystals: NONE SEEN [HPF]
Glucose, UA: NEGATIVE
Ketones, ur: NEGATIVE
Nitrite: NEGATIVE
Protein, ur: NEGATIVE
Specific Gravity, Urine: <1.005 (ref 1.001–1.035)
Yeast: NONE SEEN [HPF]
pH: 5.5 (ref 5.0–8.0)

## 2015-07-04 MED ORDER — ALPRAZOLAM 0.25 MG PO TABS: 0.2500 mg | ORAL_TABLET | Freq: Every evening | ORAL | Status: DC | PRN

## 2015-07-04 MED ORDER — CIPROFLOXACIN HCL 500 MG PO TABS: 500.0000 mg | ORAL_TABLET | Freq: Two times a day (BID) | ORAL | Status: DC

## 2015-07-04 NOTE — Patient Instructions (Signed)

## 2015-07-04 NOTE — Progress Notes (Addendum)
Patient ID: Sarah Jimenez, female   DOB: [birth year], 55 y.o.   MRN: [id number] Presents with complaint of recurrent UTI. Has been to urgent care twice in the last 2 months was treated with Keflex and Septra symptoms resolved for about a week but did return. Has had a low back ache, mild burning discomfort with urination and generally not feeling well. Also having vaginal dryness with intercourse. Symptoms originally did start after intercourse/same partner. 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 Denies vaginal discharge, abdominal pain but has had a low abdominal fullness feeling, no fever. Requested refill for Xanax does use sparingly aware of addictive properties, reports helpful for insomnia.  Exam: Appears well. Abdomen soft nontender no rebound or radiation of pain. No CVAT. Speculum exam mild atrophy, no visible discharge, erythema or odor noted UA: Trace blood, trace leukocytes, 6-10 WBCs, few bacteria  Probable UTI Vaginal atrophy  Plan: Cipro 500 twice daily for 5 days #10. Increase fluids. Instructed to call if no relief of symptoms. Urine culture pending. Has annual exam scheduled in 2 weeks will check test of cure. UTI prevention discussed. Continue vaginal lubricants with intercourse. Option of Estring reviewed declines at this time. Xanax 0.25 at bedtime when necessary #30 with 1 refill. Aware of addictive properties and to use sparingly

## 2015-07-05 LAB — URINE CULTURE
Colony Count: NO GROWTH
Organism ID, Bacteria: NO GROWTH

## 2015-07-15 ENCOUNTER — Encounter: Payer: Self-pay | Admitting: Women's Health

## 2015-07-15 ENCOUNTER — Ambulatory Visit (INDEPENDENT_AMBULATORY_CARE_PROVIDER_SITE_OTHER): Payer: BLUE CROSS/BLUE SHIELD | Admitting: Women's Health

## 2015-07-15 VITALS — BP 118/80 | Ht 63.0 in | Wt 142.0 lb

## 2015-07-15 DIAGNOSIS — Z13820 Encounter for screening for osteoporosis: Secondary | ICD-10-CM | POA: Diagnosis not present

## 2015-07-15 DIAGNOSIS — A609 Anogenital herpesviral infection, unspecified: Secondary | ICD-10-CM | POA: Diagnosis not present

## 2015-07-15 DIAGNOSIS — Z01419 Encounter for gynecological examination (general) (routine) without abnormal findings: Secondary | ICD-10-CM | POA: Diagnosis not present

## 2015-07-15 DIAGNOSIS — Z833 Family history of diabetes mellitus: Secondary | ICD-10-CM

## 2015-07-15 DIAGNOSIS — B009 Herpesviral infection, unspecified: Secondary | ICD-10-CM | POA: Diagnosis not present

## 2015-07-15 LAB — CBC WITH DIFFERENTIAL/PLATELET
Basophils Absolute: 116 cells/uL (ref 0–200)
Basophils Relative: 2 %
Eosinophils Absolute: 174 cells/uL (ref 15–500)
Eosinophils Relative: 3 %
HCT: 38.2 % (ref 35.0–45.0)
Hemoglobin: 12.9 g/dL (ref 11.7–15.5)
Lymphocytes Relative: 28 %
Lymphs Abs: 1624 cells/uL (ref 850–3900)
MCH: 32.3 pg (ref 27.0–33.0)
MCHC: 33.8 g/dL (ref 32.0–36.0)
MCV: 95.5 fL (ref 80.0–100.0)
MPV: 9.4 fL (ref 7.5–12.5)
Monocytes Absolute: 522 cells/uL (ref 200–950)
Monocytes Relative: 9 %
Neutro Abs: 3364 cells/uL (ref 1500–7800)
Neutrophils Relative %: 58 %
Platelets: 357 10*3/uL (ref 140–400)
RBC: 4 MIL/uL (ref 3.80–5.10)
RDW: 13 % (ref 11.0–15.0)
WBC: 5.8 10*3/uL (ref 3.8–10.8)

## 2015-07-15 LAB — COMPREHENSIVE METABOLIC PANEL
ALT: 14 U/L (ref 6–29)
AST: 18 U/L (ref 10–35)
Albumin: 4.2 g/dL (ref 3.6–5.1)
Alkaline Phosphatase: 50 U/L (ref 33–130)
BUN: 13 mg/dL (ref 7–25)
CO2: 26 mmol/L (ref 20–31)
Calcium: 8.9 mg/dL (ref 8.6–10.4)
Chloride: 103 mmol/L (ref 98–110)
Creat: 0.84 mg/dL (ref 0.50–1.05)
Glucose, Bld: 78 mg/dL (ref 65–99)
Potassium: 3.8 mmol/L (ref 3.5–5.3)
Sodium: 139 mmol/L (ref 135–146)
Total Bilirubin: 0.5 mg/dL (ref 0.2–1.2)
Total Protein: 6.6 g/dL (ref 6.1–8.1)

## 2015-07-15 LAB — HEMOGLOBIN A1C
Hgb A1c MFr Bld: 5.7 % — ABNORMAL HIGH (ref <5.7)
Mean Plasma Glucose: 117 mg/dL

## 2015-07-15 MED ORDER — ACYCLOVIR 200 MG PO CAPS: 200.0000 mg | ORAL_CAPSULE | ORAL | Status: DC

## 2015-07-15 NOTE — Patient Instructions (Signed)

## 2015-07-15 NOTE — Progress Notes (Signed)
VANYA NOLTON 28-Sep-1960 [id number]    History:    Presents for annual exam.  Postmenopausal with no bleeding. 12/2014 had spotting and had a negative endometrial polyp removed no bleeding since. Normal mammogram history. 2008 ASCUS with negative high risk normal Paps after. Basal skin cancer 2015 has annual skin checks. HSV rare outbreaks acyclovir occasionally. 2013 negative colonoscopy. Has not had a DEXA. 2016 hemoglobin A1c 5.8. Same partner years.   Past medical history, past surgical history, family history and social history were all reviewed and documented in the EPIC chart. 4 children ages 44 through 109 all  had gardasil and doing well. Father died of pancreatic cancer. Mother healthy. 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  ROS:  A ROS was performed and pertinent positives and negatives are included.  Exam:  Filed Vitals:   07/15/15 1550  BP: 118/80    General appearance:  Normal Thyroid:  Symmetrical, normal in size, without palpable masses or nodularity. Respiratory  Auscultation:  Clear without wheezing or rhonchi Cardiovascular  Auscultation:  Regular rate, without rubs, murmurs or gallops  Edema/varicosities:  Not grossly evident Abdominal  Soft,nontender, without masses, guarding or rebound.  Liver/spleen:  No organomegaly noted  Hernia:  None appreciated  Skin  Inspection:  Grossly normal   Breasts: Examined lying and sitting.     Right: Without masses, retractions, discharge or axillary adenopathy.     Left: Without masses, retractions, discharge or axillary adenopathy. Gentitourinary   Inguinal/mons:  Normal without inguinal adenopathy  External genitalia:  Normal  BUS/Urethra/Skene's glands:  Normal  Vagina:  Normal  Cervix:  Normal  Uterus:   normal in size, shape and contour.  Midline and mobile  Adnexa/parametria:     Rt: Without masses or tenderness.   Lt: Without masses or tenderness.  Anus and  perineum: Normal  Digital rectal exam: Normal sphincter tone without palpated masses or tenderness  Assessment/Plan:  55 y.o. D WF G4 P4 for annual exam.    Menopausal/no HRT/no bleeding Persistent UTI last month /symptoms have finally resolved after 3 antibiotics HSV-rare outbreaks acyclovir 12/2014 negative endometrial polyp no bleeding since 2015 basal skin cancer  Plan: Acyclovir 200 mg prescription , 1 daily, or up to 5 in 1 day for occasional outbreaks, prescription, proper use given and reviewed. SBE's, continue annual 3-D screening mammogram history of dense breast. Continue healthy lifestyle of healthy diet and regular exercise. Reviewed importance of keeping weight down and low sugar diet, last hemoglobin A1c was 5.8. Reviewed importance of increasing fluids avoiding caffeinated beverages. DEXA, will schedule. Continue annual skin checks. CBC, CMP, hemoglobin A1c, vitamin D, UA, Pap normal with negative HR HPV 2015, new screening guidelines reviewed.    Huel Cote [facility], 4:39 PM 07/15/2015

## 2015-07-16 ENCOUNTER — Other Ambulatory Visit: Payer: Self-pay | Admitting: Women's Health

## 2015-07-16 LAB — URINALYSIS W MICROSCOPIC + REFLEX CULTURE
Bacteria, UA: NONE SEEN [HPF]
Bilirubin Urine: NEGATIVE
Casts: NONE SEEN [LPF]
Crystals: NONE SEEN [HPF]
Glucose, UA: NEGATIVE
Hgb urine dipstick: NEGATIVE
Ketones, ur: NEGATIVE
Leukocytes, UA: NEGATIVE
Nitrite: NEGATIVE
Protein, ur: NEGATIVE
RBC / HPF: NONE SEEN RBC/HPF (ref <=2)
Specific Gravity, Urine: 1.015 (ref 1.001–1.035)
Squamous Epithelial / LPF: NONE SEEN [HPF] (ref <=5)
WBC, UA: NONE SEEN WBC/HPF (ref <=5)
Yeast: NONE SEEN [HPF]
pH: 5 (ref 5.0–8.0)

## 2015-07-16 LAB — VITAMIN D 25 HYDROXY (VIT D DEFICIENCY, FRACTURES): Vit D, 25-Hydroxy: 29 ng/mL — ABNORMAL LOW (ref 30–100)

## 2015-07-16 MED ORDER — VITAMIN D (ERGOCALCIFEROL) 1.25 MG (50000 UNIT) PO CAPS: 50000.0000 [IU] | ORAL_CAPSULE | ORAL | Status: DC

## 2015-08-05 DIAGNOSIS — L821 Other seborrheic keratosis: Secondary | ICD-10-CM | POA: Diagnosis not present

## 2015-08-05 DIAGNOSIS — L814 Other melanin hyperpigmentation: Secondary | ICD-10-CM | POA: Diagnosis not present

## 2015-08-05 DIAGNOSIS — D225 Melanocytic nevi of trunk: Secondary | ICD-10-CM | POA: Diagnosis not present

## 2015-08-05 DIAGNOSIS — Z85828 Personal history of other malignant neoplasm of skin: Secondary | ICD-10-CM | POA: Diagnosis not present

## 2015-08-05 DIAGNOSIS — L570 Actinic keratosis: Secondary | ICD-10-CM | POA: Diagnosis not present

## 2015-08-15 ENCOUNTER — Ambulatory Visit (INDEPENDENT_AMBULATORY_CARE_PROVIDER_SITE_OTHER): Payer: BLUE CROSS/BLUE SHIELD

## 2015-08-15 DIAGNOSIS — Z13820 Encounter for screening for osteoporosis: Secondary | ICD-10-CM | POA: Diagnosis not present

## 2015-09-23 DIAGNOSIS — M546 Pain in thoracic spine: Secondary | ICD-10-CM | POA: Diagnosis not present

## 2015-09-23 DIAGNOSIS — R399 Unspecified symptoms and signs involving the genitourinary system: Secondary | ICD-10-CM | POA: Diagnosis not present

## 2015-10-05 ENCOUNTER — Other Ambulatory Visit: Payer: Self-pay | Admitting: Women's Health

## 2015-10-08 ENCOUNTER — Other Ambulatory Visit: Payer: Self-pay | Admitting: Women's Health

## 2015-10-08 DIAGNOSIS — Z1231 Encounter for screening mammogram for malignant neoplasm of breast: Secondary | ICD-10-CM

## 2015-11-11 ENCOUNTER — Other Ambulatory Visit: Payer: Self-pay | Admitting: Women's Health

## 2015-11-11 DIAGNOSIS — F4322 Adjustment disorder with anxiety: Secondary | ICD-10-CM

## 2015-11-11 NOTE — Telephone Encounter (Addendum)
Rx called in 

## 2015-11-11 NOTE — Telephone Encounter (Signed)
Ok for refill? 

## 2015-11-11 NOTE — Telephone Encounter (Signed)
Lasted filled on 07/04/15

## 2015-11-14 ENCOUNTER — Ambulatory Visit: Payer: BLUE CROSS/BLUE SHIELD

## 2015-12-16 ENCOUNTER — Ambulatory Visit: Payer: BLUE CROSS/BLUE SHIELD

## 2016-01-21 ENCOUNTER — Ambulatory Visit
Admission: RE | Admit: 2016-01-21 | Discharge: 2016-01-21 | Disposition: A | Discharge status: Home / Self Care | Payer: BLUE CROSS/BLUE SHIELD | Source: Ambulatory Visit | Attending: Women's Health | Admitting: Women's Health

## 2016-01-21 DIAGNOSIS — Z1231 Encounter for screening mammogram for malignant neoplasm of breast: Secondary | ICD-10-CM | POA: Diagnosis not present

## 2016-01-24 ENCOUNTER — Other Ambulatory Visit: Payer: Self-pay | Admitting: Women's Health

## 2016-01-24 DIAGNOSIS — R928 Other abnormal and inconclusive findings on diagnostic imaging of breast: Secondary | ICD-10-CM

## 2016-01-28 DIAGNOSIS — L738 Other specified follicular disorders: Secondary | ICD-10-CM | POA: Diagnosis not present

## 2016-01-28 DIAGNOSIS — L720 Epidermal cyst: Secondary | ICD-10-CM | POA: Diagnosis not present

## 2016-01-28 DIAGNOSIS — L918 Other hypertrophic disorders of the skin: Secondary | ICD-10-CM | POA: Diagnosis not present

## 2016-01-28 DIAGNOSIS — L821 Other seborrheic keratosis: Secondary | ICD-10-CM | POA: Diagnosis not present

## 2016-01-28 DIAGNOSIS — Z85828 Personal history of other malignant neoplasm of skin: Secondary | ICD-10-CM | POA: Diagnosis not present

## 2016-01-31 ENCOUNTER — Ambulatory Visit
Admission: RE | Admit: 2016-01-31 | Discharge: 2016-01-31 | Disposition: A | Discharge status: Home / Self Care | Payer: BLUE CROSS/BLUE SHIELD | Source: Ambulatory Visit | Attending: Women's Health | Admitting: Women's Health

## 2016-01-31 ENCOUNTER — Other Ambulatory Visit: Payer: Self-pay | Admitting: Women's Health

## 2016-01-31 DIAGNOSIS — N6489 Other specified disorders of breast: Secondary | ICD-10-CM | POA: Diagnosis not present

## 2016-01-31 DIAGNOSIS — R928 Other abnormal and inconclusive findings on diagnostic imaging of breast: Secondary | ICD-10-CM

## 2016-01-31 DIAGNOSIS — R922 Inconclusive mammogram: Secondary | ICD-10-CM | POA: Diagnosis not present

## 2016-03-25 ENCOUNTER — Other Ambulatory Visit: Payer: Self-pay | Admitting: Women's Health

## 2016-03-25 DIAGNOSIS — F4322 Adjustment disorder with anxiety: Secondary | ICD-10-CM

## 2016-03-25 NOTE — Telephone Encounter (Signed)
Ok for refill? 

## 2016-03-25 NOTE — Telephone Encounter (Signed)
Rx called

## 2016-03-25 NOTE — Telephone Encounter (Signed)
Last filled in Oct 2017 with 1 refill.

## 2016-06-10 ENCOUNTER — Encounter: Payer: Self-pay | Admitting: Gynecology

## 2016-07-22 ENCOUNTER — Encounter: Payer: Self-pay | Admitting: Women's Health

## 2016-07-22 ENCOUNTER — Ambulatory Visit (INDEPENDENT_AMBULATORY_CARE_PROVIDER_SITE_OTHER): Payer: BLUE CROSS/BLUE SHIELD | Admitting: Women's Health

## 2016-07-22 VITALS — BP 110/80 | Ht 63.0 in | Wt 132.0 lb

## 2016-07-22 DIAGNOSIS — Z01419 Encounter for gynecological examination (general) (routine) without abnormal findings: Secondary | ICD-10-CM

## 2016-07-22 DIAGNOSIS — Z13220 Encounter for screening for lipoid disorders: Secondary | ICD-10-CM | POA: Diagnosis not present

## 2016-07-22 DIAGNOSIS — F4322 Adjustment disorder with anxiety: Secondary | ICD-10-CM

## 2016-07-22 DIAGNOSIS — B009 Herpesviral infection, unspecified: Secondary | ICD-10-CM | POA: Diagnosis not present

## 2016-07-22 DIAGNOSIS — Z1151 Encounter for screening for human papillomavirus (HPV): Secondary | ICD-10-CM | POA: Diagnosis not present

## 2016-07-22 DIAGNOSIS — E559 Vitamin D deficiency, unspecified: Secondary | ICD-10-CM | POA: Diagnosis not present

## 2016-07-22 LAB — CBC WITH DIFFERENTIAL/PLATELET
Basophils Absolute: 51 cells/uL (ref 0–200)
Basophils Relative: 1 %
Eosinophils Absolute: 102 cells/uL (ref 15–500)
Eosinophils Relative: 2 %
HCT: 42.4 % (ref 35.0–45.0)
Hemoglobin: 14 g/dL (ref 11.7–15.5)
Lymphocytes Relative: 22 %
Lymphs Abs: 1122 cells/uL (ref 850–3900)
MCH: 32 pg (ref 27.0–33.0)
MCHC: 33 g/dL (ref 32.0–36.0)
MCV: 97 fL (ref 80.0–100.0)
MPV: 10 fL (ref 7.5–12.5)
Monocytes Absolute: 510 cells/uL (ref 200–950)
Monocytes Relative: 10 %
Neutro Abs: 3315 cells/uL (ref 1500–7800)
Neutrophils Relative %: 65 %
Platelets: 362 10*3/uL (ref 140–400)
RBC: 4.37 MIL/uL (ref 3.80–5.10)
RDW: 13.1 % (ref 11.0–15.0)
WBC: 5.1 10*3/uL (ref 3.8–10.8)

## 2016-07-22 LAB — LIPID PANEL
Cholesterol: 193 mg/dL (ref <200)
HDL: 86 mg/dL (ref >50)
LDL Cholesterol: 95 mg/dL (ref <100)
Total CHOL/HDL Ratio: 2.2 Ratio (ref <5.0)
Triglycerides: 60 mg/dL (ref <150)
VLDL: 12 mg/dL (ref <30)

## 2016-07-22 LAB — COMPREHENSIVE METABOLIC PANEL
ALT: 13 U/L (ref 6–29)
AST: 18 U/L (ref 10–35)
Albumin: 4.3 g/dL (ref 3.6–5.1)
Alkaline Phosphatase: 62 U/L (ref 33–130)
BUN: 12 mg/dL (ref 7–25)
CO2: 24 mmol/L (ref 20–31)
Calcium: 9.6 mg/dL (ref 8.6–10.4)
Chloride: 103 mmol/L (ref 98–110)
Creat: 0.82 mg/dL (ref 0.50–1.05)
Glucose, Bld: 97 mg/dL (ref 65–99)
Potassium: 4.3 mmol/L (ref 3.5–5.3)
Sodium: 138 mmol/L (ref 135–146)
Total Bilirubin: 0.6 mg/dL (ref 0.2–1.2)
Total Protein: 7 g/dL (ref 6.1–8.1)

## 2016-07-22 MED ORDER — ACYCLOVIR 200 MG PO CAPS: 200.0000 mg | ORAL_CAPSULE | Freq: Every day | ORAL | 1 refills | Status: DC

## 2016-07-22 MED ORDER — ALPRAZOLAM 0.25 MG PO TABS: 0.2500 mg | ORAL_TABLET | Freq: Every evening | ORAL | 1 refills | Status: DC | PRN

## 2016-07-22 NOTE — Addendum Note (Signed)
Addended by: Thamas Jaegers on: 07/22/2016 09:54 AM   Modules accepted: Orders

## 2016-07-22 NOTE — Progress Notes (Signed)
Sarah Jimenez May 30, 1960 [id number]    History:    Presents for annual exam.  Postmenopausal on no HRT with no bleeding. 12/2015 negative endometrial polyp with no bleeding since. 2013 negative colonoscopy. 2017 normal DEXA hip average +0.4. History of basal skin cancer has annual skin checks. Normal Pap and mammogram history mammogram January 2018 normal after ultrasound. HSV rare outbreaks. Not sexually active. Has lost 10 pounds in the past year with diet and exercise.  Past medical history, past surgical history, family history and social history were all reviewed and documented in the EPIC chart. Works part-time doing Teaching laboratory technician work. Mother alive and well, father died of pancreatic cancer at age 33. Originally from [place].  ROS:  A ROS was performed and pertinent positives and negatives are included.  Exam:  Vitals:   07/22/16 0808  BP: 110/80  Weight: 132 lb (59.9 kg)  Height: 5\' 3"  (1.6 m)   Body mass index is 23.38 kg/m.   General appearance:  Normal Thyroid:  Symmetrical, normal in size, without palpable masses or nodularity. Respiratory  Auscultation:  Clear without wheezing or rhonchi Cardiovascular  Auscultation:  Regular rate, without rubs, murmurs or gallops  Edema/varicosities:  Not grossly evident Abdominal  Soft,nontender, without masses, guarding or rebound.  Liver/spleen:  No organomegaly noted  Hernia:  None appreciated  Skin  Inspection:  Grossly normal   Breasts: Examined lying and sitting.     Right: Without masses, retractions, discharge or axillary adenopathy.     Left: Without masses, retractions, discharge or axillary adenopathy. Gentitourinary   Inguinal/mons:  Normal without inguinal adenopathy  External genitalia:  Normal  BUS/Urethra/Skene's glands:  Normal  Vagina:  Normal  Cervix:  Normal  Uterus:   normal in size, shape and contour.  Midline and mobile  Adnexa/parametria:     Rt: Without masses or tenderness.   Lt: Without  masses or tenderness.  Anus and perineum: Normal  Digital rectal exam: Normal sphincter tone without palpated masses or tenderness  Assessment/Plan:  56 y.o. DW F G4 P4 for annual exam with no complaints.  Postmenopausal/no HRT/no bleeding Normal DEXA Basal skin cancer-annual skin checks Anxiety/depression-counseling HSV-rare outbreaks  Plan: SBE's, continue annual 3-D screening mammogram, calcium rich diet, vitamin D 2000 daily encouraged. Continue counseling, Xanax 0.25 when necessary aware of addictive properties does use sparingly. Uses mostly for insomnia. Zovirax 200 mg 5 times daily when necessary prescription, proper use given and reviewed. Continue healthy lifestyle of regular exercise, healthy diet. CBC, CMP, lipid panel, vitamin D, Pap with HR HPV typing, new screening guidelines reviewed.  [place], 9:02 AM 07/22/2016

## 2016-07-22 NOTE — Patient Instructions (Signed)
Health Maintenance for Postmenopausal Women Menopause is a normal process in which your reproductive ability comes to an end. This process happens gradually over a span of months to years, usually between the ages of 22 and 9. Menopause is complete when you have missed 12 consecutive menstrual periods. It is important to talk with your health care provider about some of the most common conditions that affect postmenopausal women, such as heart disease, cancer, and bone loss (osteoporosis). Adopting a healthy lifestyle and getting preventive care can help to promote your health and wellness. Those actions can also lower your chances of developing some of these common conditions. What should I know about menopause? During menopause, you may experience a number of symptoms, such as:  Moderate-to-severe hot flashes.  Night sweats.  Decrease in sex drive.  Mood swings.  Headaches.  Tiredness.  Irritability.  Memory problems.  Insomnia.  Choosing to treat or not to treat menopausal changes is an individual decision that you make with your health care provider. What should I know about hormone replacement therapy and supplements? Hormone therapy products are effective for treating symptoms that are associated with menopause, such as hot flashes and night sweats. Hormone replacement carries certain risks, especially as you become older. If you are thinking about using estrogen or estrogen with progestin treatments, discuss the benefits and risks with your health care provider. What should I know about heart disease and stroke? Heart disease, heart attack, and stroke become more likely as you age. This may be due, in part, to the hormonal changes that your body experiences during menopause. These can affect how your body processes dietary fats, triglycerides, and cholesterol. Heart attack and stroke are both medical emergencies. There are many things that you can do to help prevent heart disease  and stroke:  Have your blood pressure checked at least every 1-2 years. High blood pressure causes heart disease and increases the risk of stroke.  If you are 56-22 years old, ask your health care provider if you should take aspirin to prevent a heart attack or a stroke.  Do not use any tobacco products, including cigarettes, chewing tobacco, or electronic cigarettes. If you need help quitting, ask your health care provider.  It is important to eat a healthy diet and maintain a healthy weight. ? Be sure to include plenty of vegetables, fruits, low-fat dairy products, and lean protein. ? Avoid eating foods that are high in solid fats, added sugars, or salt (sodium).  Get regular exercise. This is one of the most important things that you can do for your health. ? Try to exercise for at least 150 minutes each week. The type of exercise that you do should increase your heart rate and make you sweat. This is known as moderate-intensity exercise. ? Try to do strengthening exercises at least twice each week. Do these in addition to the moderate-intensity exercise.  Know your numbers.Ask your health care provider to check your cholesterol and your blood glucose. Continue to have your blood tested as directed by your health care provider.  What should I know about cancer screening? There are several types of cancer. Take the following steps to reduce your risk and to catch any cancer development as early as possible. Breast Cancer  Practice breast self-awareness. ? This means understanding how your breasts normally appear and feel. ? It also means doing regular breast self-exams. Let your health care provider know about any changes, no matter how small.  If you are 56  or older, have a clinician do a breast exam (clinical breast exam or CBE) every year. Depending on your age, family history, and medical history, it may be recommended that you also have a yearly breast X-ray (mammogram).  If you  have a family history of breast cancer, talk with your health care provider about genetic screening.  If you are at high risk for breast cancer, talk with your health care provider about having an MRI and a mammogram every year.  Breast cancer (BRCA) gene test is recommended for women who have family members with BRCA-related cancers. Results of the assessment will determine the need for genetic counseling and BRCA1 and for BRCA2 testing. BRCA-related cancers include these types: ? Breast. This occurs in males or females. ? Ovarian. ? Tubal. This may also be called fallopian tube cancer. ? Cancer of the abdominal or pelvic lining (peritoneal cancer). ? Prostate. ? Pancreatic.  Cervical, Uterine, and Ovarian Cancer Your health care provider may recommend that you be screened regularly for cancer of the pelvic organs. These include your ovaries, uterus, and vagina. This screening involves a pelvic exam, which includes checking for microscopic changes to the surface of your cervix (Pap test).  For women ages 56-65, health care providers may recommend a pelvic exam and a Pap test every three years. For women ages 56-65 they may recommend the Pap test and pelvic exam, combined with testing for human papilloma virus (HPV), every five years. Some types of HPV increase your risk of cervical cancer. Testing for HPV may also be done on women of any age who have unclear Pap test results.  Other health care providers may not recommend any screening for nonpregnant women who are considered low risk for pelvic cancer and have no symptoms. Ask your health care provider if a screening pelvic exam is right for you.  If you have had past treatment for cervical cancer or a condition that could lead to cancer, you need Pap tests and screening for cancer for at least 20 years after your treatment. If Pap tests have been discontinued for you, your risk factors (such as having a new sexual partner) need to be  reassessed to determine if you should start having screenings again. Some women have medical problems that increase the chance of getting cervical cancer. In these cases, your health care provider may recommend that you have screening and Pap tests more often.  If you have a family history of uterine cancer or ovarian cancer, talk with your health care provider about genetic screening.  If you have vaginal bleeding after reaching menopause, tell your health care provider.  There are currently no reliable tests available to screen for ovarian cancer.  Lung Cancer Lung cancer screening is recommended for adults 69-62 years old who are at high risk for lung cancer because of a history of smoking. A yearly low-dose CT scan of the lungs is recommended if you:  Currently smoke.  Have a history of at least 30 pack-years of smoking and you currently smoke or have quit within the past 15 years. A pack-year is smoking an average of one pack of cigarettes per day for one year.  Yearly screening should:  Continue until it has been 15 years since you quit.  Stop if you develop a health problem that would prevent you from having lung cancer treatment.  Colorectal Cancer  This type of cancer can be detected and can often be prevented.  Routine colorectal cancer screening usually begins at  age 42 and continues through age 45.  If you have risk factors for colon cancer, your health care provider may recommend that you be screened at an earlier age.  If you have a family history of colorectal cancer, talk with your health care provider about genetic screening.  Your health care provider may also recommend using home test kits to check for hidden blood in your stool.  A small camera at the end of a tube can be used to examine your colon directly (sigmoidoscopy or colonoscopy). This is done to check for the earliest forms of colorectal cancer.  Direct examination of the colon should be repeated every  5-10 years until age 71. However, if early forms of precancerous polyps or small growths are found or if you have a family history or genetic risk for colorectal cancer, you may need to be screened more often.  Skin Cancer  Check your skin from head to toe regularly.  Monitor any moles. Be sure to tell your health care provider: ? About any new moles or changes in moles, especially if there is a change in a mole's shape or color. ? If you have a mole that is larger than the size of a pencil eraser.  If any of your family members has a history of skin cancer, especially at a young age, talk with your health care provider about genetic screening.  Always use sunscreen. Apply sunscreen liberally and repeatedly throughout the day.  Whenever you are outside, protect yourself by wearing long sleeves, pants, a wide-brimmed hat, and sunglasses.  What should I know about osteoporosis? Osteoporosis is a condition in which bone destruction happens more quickly than new bone creation. After menopause, you may be at an increased risk for osteoporosis. To help prevent osteoporosis or the bone fractures that can happen because of osteoporosis, the following is recommended:  If you are 46-71 years old, get at least 1,000 mg of calcium and at least 600 mg of vitamin D per day.  If you are older than age 55 but younger than age 65, get at least 1,200 mg of calcium and at least 600 mg of vitamin D per day.  If you are older than age 54, get at least 1,200 mg of calcium and at least 800 mg of vitamin D per day.  Smoking and excessive alcohol intake increase the risk of osteoporosis. Eat foods that are rich in calcium and vitamin D, and do weight-bearing exercises several times each week as directed by your health care provider. What should I know about how menopause affects my mental health? Depression may occur at any age, but it is more common as you become older. Common symptoms of depression  include:  Low or sad mood.  Changes in sleep patterns.  Changes in appetite or eating patterns.  Feeling an overall lack of motivation or enjoyment of activities that you previously enjoyed.  Frequent crying spells.  Talk with your health care provider if you think that you are experiencing depression. What should I know about immunizations? It is important that you get and maintain your immunizations. These include:  Tetanus, diphtheria, and pertussis (Tdap) booster vaccine.  Influenza every year before the flu season begins.  Pneumonia vaccine.  Shingles vaccine.  Your health care provider may also recommend other immunizations. This information is not intended to replace advice given to you by your health care provider. Make sure you discuss any questions you have with your health care provider. Document Released: 03/06/2005  Document Revised: 08/02/2015 Document Reviewed: 10/16/2014 Elsevier Interactive Patient Education  2018 [employer]  

## 2016-07-23 LAB — VITAMIN D 25 HYDROXY (VIT D DEFICIENCY, FRACTURES): Vit D, 25-Hydroxy: 46 ng/mL (ref 30–100)

## 2016-07-24 LAB — PAP, TP IMAGING W/ HPV RNA, RFLX HPV TYPE 16,18/45: HPV mRNA, High Risk: NOT DETECTED

## 2016-09-15 DIAGNOSIS — M542 Cervicalgia: Secondary | ICD-10-CM | POA: Diagnosis not present

## 2016-09-15 DIAGNOSIS — M5489 Other dorsalgia: Secondary | ICD-10-CM | POA: Diagnosis not present

## 2016-09-15 DIAGNOSIS — R030 Elevated blood-pressure reading, without diagnosis of hypertension: Secondary | ICD-10-CM | POA: Diagnosis not present

## 2016-10-14 ENCOUNTER — Ambulatory Visit (INDEPENDENT_AMBULATORY_CARE_PROVIDER_SITE_OTHER): Payer: BLUE CROSS/BLUE SHIELD | Admitting: Women's Health

## 2016-10-14 ENCOUNTER — Encounter: Payer: Self-pay | Admitting: Women's Health

## 2016-10-14 VITALS — BP 120/78 | Ht 63.0 in | Wt 132.0 lb

## 2016-10-14 DIAGNOSIS — N644 Mastodynia: Secondary | ICD-10-CM | POA: Diagnosis not present

## 2016-10-14 NOTE — Progress Notes (Signed)
Presents with complaint of left breast tenderness for the past 10 days. Tenderness is more in the upper quadrant, around nipple and base of left breast, occasional tenderness in the right breast. Questions if it is from perspiration. No family history of breast cancer. Normal mammogram in January 2018. No change in routine, injury, visible nipple discharge or palpable changes with breast exam. Postmenopausal on no HRT with no bleeding.  Exam: Appears well. Is in [employer], drilling. Breast exam and sitting and lying position, no visible retractions, dimpling, erythema, nipple discharge or palpable nodules. Tenderness in upper quadrant of left breast, around areola and some discomfort in the lower quadrant of left breast.  Left breast mastodynia  Plan: Left breast diagnostic mammogram will get scheduled. Vitamin E twice daily, loose clothing.

## 2016-10-14 NOTE — Patient Instructions (Signed)

## 2016-10-15 ENCOUNTER — Telehealth: Payer: Self-pay | Admitting: *Deleted

## 2016-10-15 DIAGNOSIS — N644 Mastodynia: Secondary | ICD-10-CM

## 2016-10-15 NOTE — Telephone Encounter (Signed)
-----   Message from Huel Cote, NP sent at 10/14/2016  2:19 PM EDT ----- Needs left mammogram diagnostic mammogram, 10 day Left breast tenderness upper quad and areola area.   Tuesday am not avail.  Breast center, hx of dense breasts

## 2016-10-15 NOTE — Telephone Encounter (Signed)
Pt scheduled on 10/21/16 @ 10:00am at breast center, left message for pt to call.

## 2016-10-15 NOTE — Telephone Encounter (Signed)
Pt informed

## 2016-10-21 ENCOUNTER — Ambulatory Visit: Payer: BLUE CROSS/BLUE SHIELD

## 2016-10-21 ENCOUNTER — Ambulatory Visit
Admission: RE | Admit: 2016-10-21 | Discharge: 2016-10-21 | Disposition: A | Discharge status: Home / Self Care | Payer: BLUE CROSS/BLUE SHIELD | Source: Ambulatory Visit | Attending: Women's Health | Admitting: Women's Health

## 2016-10-21 ENCOUNTER — Other Ambulatory Visit: Payer: BLUE CROSS/BLUE SHIELD

## 2016-10-21 DIAGNOSIS — N644 Mastodynia: Secondary | ICD-10-CM

## 2016-10-21 DIAGNOSIS — L920 Granuloma annulare: Secondary | ICD-10-CM | POA: Diagnosis not present

## 2016-10-21 DIAGNOSIS — L814 Other melanin hyperpigmentation: Secondary | ICD-10-CM | POA: Diagnosis not present

## 2016-10-21 DIAGNOSIS — L738 Other specified follicular disorders: Secondary | ICD-10-CM | POA: Diagnosis not present

## 2016-10-21 DIAGNOSIS — L570 Actinic keratosis: Secondary | ICD-10-CM | POA: Diagnosis not present

## 2016-10-21 DIAGNOSIS — Z85828 Personal history of other malignant neoplasm of skin: Secondary | ICD-10-CM | POA: Diagnosis not present

## 2016-10-21 DIAGNOSIS — R922 Inconclusive mammogram: Secondary | ICD-10-CM | POA: Diagnosis not present

## 2016-11-04 ENCOUNTER — Ambulatory Visit (INDEPENDENT_AMBULATORY_CARE_PROVIDER_SITE_OTHER): Payer: BLUE CROSS/BLUE SHIELD | Admitting: Orthopedic Surgery

## 2016-12-03 ENCOUNTER — Ambulatory Visit (INDEPENDENT_AMBULATORY_CARE_PROVIDER_SITE_OTHER): Payer: Self-pay

## 2016-12-03 ENCOUNTER — Ambulatory Visit (INDEPENDENT_AMBULATORY_CARE_PROVIDER_SITE_OTHER): Payer: BLUE CROSS/BLUE SHIELD | Admitting: Orthopedic Surgery

## 2016-12-03 ENCOUNTER — Ambulatory Visit (INDEPENDENT_AMBULATORY_CARE_PROVIDER_SITE_OTHER): Payer: BLUE CROSS/BLUE SHIELD

## 2016-12-03 ENCOUNTER — Encounter (INDEPENDENT_AMBULATORY_CARE_PROVIDER_SITE_OTHER): Payer: Self-pay | Admitting: Orthopedic Surgery

## 2016-12-03 DIAGNOSIS — M79671 Pain in right foot: Secondary | ICD-10-CM

## 2016-12-03 DIAGNOSIS — M79672 Pain in left foot: Secondary | ICD-10-CM | POA: Diagnosis not present

## 2016-12-03 MED ORDER — DICLOFENAC SODIUM 2 % TD SOLN: 2.0000 | Freq: Two times a day (BID) | TRANSDERMAL | 0 refills | Status: DC | PRN

## 2016-12-03 NOTE — Progress Notes (Signed)
Office Visit Note   Patient: Sarah Jimenez           Date of Birth: 08/02/1960           MRN: [id number] Visit Date: 12/03/2016 Requested by: Huel Cote, NP [address], [place] [address] PCP: Huel Cote, NP  Subjective: Chief Complaint  Patient presents with  . Foot Pain    bilateral foot pain    HPI: Changes a patient with bilateral foot pain left worse than right.  She describes pain primarily at the lateral base of the fifth metatarsal.  Been ongoing for several months.  She typically walks about 4 miles a day but hasn't been able to do so recently.  She reports some stiffness and dorsal pain in both feet when she is walking.              ROS: All systems reviewed are negative as they relate to the chief complaint within the history of present illness.  Patient denies  fevers or chills.   Assessment & Plan: Visit Diagnoses:  1. Bilateral foot pain     Plan: Impression is bilateral foot pain with no evidence on radiographs of stress fracture at the fifth metatarsal.  Plan is over-the-counter arches in shoes which matched the patient's high arch.  She wants to avoid molded arch is for now because of the expense.  I think at [facility] said topical anti-inflammatory prescription could help with that focal pain on the lateral side.  If she has persistent pain into the knee year I would recommend scanning of the left foot to rule out stress reaction in the fifth metatarsal base.  Anticipate change in exercise regimen would help this likely peroneal tendinitis to heal.  Follow-Up Instructions: Return if symptoms worsen or fail to improve.   Orders:  Orders Placed This Encounter  Procedures  . XR Foot Complete Right  . XR Foot Complete Left   Meds ordered this encounter  Medications  . Diclofenac Sodium (PENNSAID) 2 % SOLN    Sig: Place 2 Squirts 2 (two) times daily as needed onto the skin.    Dispense:  1 Bottle    Refill:  0       Procedures: No procedures performed   Clinical Data: No additional findings.  Objective: Vital Signs: There were no vitals taken for this visit.  Physical Exam:   Constitutional: Patient appears well-developed HEENT:  Head: Normocephalic Eyes:EOM are normal Neck: Normal range of motion Cardiovascular: Normal rate Pulmonary/chest: Effort normal Neurologic: Patient is alert Skin: Skin is warm Psychiatric: Patient has normal mood and affect    Ortho Exam: Orthopedic exam demonstrates normal gait alignment relatively high arch is palpable pedal pulses no pain with pronation supination of the forefoot tibiotalar subtalar range of motion intact.  Patient has palpable intact nontender anterior to posterior tib peroneal and Achilles tendons.  No other masses lymph adenopathy or skin changes noted in the foot or ankle region.  No real tenderness on the plantar fascia but she does have tenderness on that peroneal attachment to the base of the fifth metatarsal.  No real tenderness in the Jones fracture area and there is no swelling in this area.  Specialty Comments:  No specialty comments available.  Imaging: Xr Foot Complete Left  Result Date: 12/03/2016 AP lateral oblique left foot reviewed.  No evidence of stress fracture or stress reaction at the base of fifth metatarsal.  Alignment of the tarsometatarsal  joints intact and no significant degenerative changes present.  Moderately high arch noted  Xr Foot Complete Right  Result Date: 12/03/2016 AP lateral oblique right foot reviewed.  Normal tarsometatarsal alignment and no degenerative changes present.  Moderately high arch observed    PMFS History: Patient Active Problem List   Diagnosis Date Noted  . HSV infection 07/03/2011   Past Medical History:  Diagnosis Date  . Cancer ([place])    Pre cancer skin  . Heart murmur     Family History  Problem Relation Age of Onset  . Hypertension Father   . Cancer Father 4        pancreatic  . Hypertension Brother   . Heart disease Maternal Grandfather   . Colon cancer Paternal Uncle 42  . Stomach cancer Neg Hx   . Esophageal cancer Neg Hx     Past Surgical History:  Procedure Laterality Date  . pyloric stenosis  53   Social History   Occupational History  . Not on file  Tobacco Use  . Smoking status: Never Smoker  . Smokeless tobacco: Never Used  Substance and Sexual Activity  . Alcohol use: Yes    Alcohol/week: 1.2 oz    Types: 2 Glasses of wine per week    Comment: LITTLE  . Drug use: Yes    Comment: Edible Marijuana tried while in [place]  . Sexual activity: Yes    Birth control/protection: Other-see comments    Comment: vasectomy, INTERCOURSE AGE 47, SEXUAL PARTNERS MORE THAN 5

## 2017-01-07 DIAGNOSIS — F341 Dysthymic disorder: Secondary | ICD-10-CM | POA: Diagnosis not present

## 2017-01-22 ENCOUNTER — Other Ambulatory Visit: Payer: Self-pay | Admitting: Women's Health

## 2017-01-22 DIAGNOSIS — F4322 Adjustment disorder with anxiety: Secondary | ICD-10-CM

## 2017-01-25 NOTE — Telephone Encounter (Signed)
Last filled on 07/22/16 with 1 refill.

## 2017-01-25 NOTE — Telephone Encounter (Signed)
Rx called in 

## 2017-01-25 NOTE — Telephone Encounter (Signed)
Ok for refill? 

## 2017-02-03 DIAGNOSIS — F341 Dysthymic disorder: Secondary | ICD-10-CM | POA: Diagnosis not present

## 2017-02-10 DIAGNOSIS — F341 Dysthymic disorder: Secondary | ICD-10-CM | POA: Diagnosis not present

## 2017-02-17 DIAGNOSIS — F341 Dysthymic disorder: Secondary | ICD-10-CM | POA: Diagnosis not present

## 2017-03-10 DIAGNOSIS — F341 Dysthymic disorder: Secondary | ICD-10-CM | POA: Diagnosis not present

## 2017-03-24 DIAGNOSIS — F341 Dysthymic disorder: Secondary | ICD-10-CM | POA: Diagnosis not present

## 2017-05-26 ENCOUNTER — Other Ambulatory Visit: Payer: Self-pay | Admitting: Women's Health

## 2017-05-26 DIAGNOSIS — Z1231 Encounter for screening mammogram for malignant neoplasm of breast: Secondary | ICD-10-CM

## 2017-06-16 ENCOUNTER — Ambulatory Visit
Admission: RE | Admit: 2017-06-16 | Discharge: 2017-06-16 | Disposition: A | Discharge status: Home / Self Care | Payer: BLUE CROSS/BLUE SHIELD | Source: Ambulatory Visit | Attending: Women's Health | Admitting: Women's Health

## 2017-06-16 DIAGNOSIS — Z1231 Encounter for screening mammogram for malignant neoplasm of breast: Secondary | ICD-10-CM | POA: Diagnosis not present

## 2017-07-28 ENCOUNTER — Encounter: Payer: Self-pay | Admitting: Women's Health

## 2017-07-28 ENCOUNTER — Ambulatory Visit (INDEPENDENT_AMBULATORY_CARE_PROVIDER_SITE_OTHER): Payer: BLUE CROSS/BLUE SHIELD | Admitting: Women's Health

## 2017-07-28 VITALS — BP 110/79 | Ht 63.0 in | Wt 138.2 lb

## 2017-07-28 DIAGNOSIS — Z113 Encounter for screening for infections with a predominantly sexual mode of transmission: Secondary | ICD-10-CM

## 2017-07-28 DIAGNOSIS — F4322 Adjustment disorder with anxiety: Secondary | ICD-10-CM | POA: Diagnosis not present

## 2017-07-28 DIAGNOSIS — Z01419 Encounter for gynecological examination (general) (routine) without abnormal findings: Secondary | ICD-10-CM | POA: Diagnosis not present

## 2017-07-28 DIAGNOSIS — Z13220 Encounter for screening for lipoid disorders: Secondary | ICD-10-CM

## 2017-07-28 DIAGNOSIS — B009 Herpesviral infection, unspecified: Secondary | ICD-10-CM

## 2017-07-28 DIAGNOSIS — Z833 Family history of diabetes mellitus: Secondary | ICD-10-CM | POA: Diagnosis not present

## 2017-07-28 MED ORDER — ACYCLOVIR 200 MG PO CAPS: 200.0000 mg | ORAL_CAPSULE | Freq: Every day | ORAL | 1 refills | Status: DC

## 2017-07-28 MED ORDER — ALPRAZOLAM 0.25 MG PO TABS: 0.2500 mg | ORAL_TABLET | Freq: Every evening | ORAL | 1 refills | Status: DC | PRN

## 2017-07-28 NOTE — Patient Instructions (Signed)
Health Maintenance for Postmenopausal Women Menopause is a normal process in which your reproductive ability comes to an end. This process happens gradually over a span of months to years, usually between the ages of 22 and 9. Menopause is complete when you have missed 12 consecutive menstrual periods. It is important to talk with your health care provider about some of the most common conditions that affect postmenopausal women, such as heart disease, cancer, and bone loss (osteoporosis). Adopting a healthy lifestyle and getting preventive care can help to promote your health and wellness. Those actions can also lower your chances of developing some of these common conditions. What should I know about menopause? During menopause, you may experience a number of symptoms, such as:  Moderate-to-severe hot flashes.  Night sweats.  Decrease in sex drive.  Mood swings.  Headaches.  Tiredness.  Irritability.  Memory problems.  Insomnia.  Choosing to treat or not to treat menopausal changes is an individual decision that you make with your health care provider. What should I know about hormone replacement therapy and supplements? Hormone therapy products are effective for treating symptoms that are associated with menopause, such as hot flashes and night sweats. Hormone replacement carries certain risks, especially as you become older. If you are thinking about using estrogen or estrogen with progestin treatments, discuss the benefits and risks with your health care provider. What should I know about heart disease and stroke? Heart disease, heart attack, and stroke become more likely as you age. This may be due, in part, to the hormonal changes that your body experiences during menopause. These can affect how your body processes dietary fats, triglycerides, and cholesterol. Heart attack and stroke are both medical emergencies. There are many things that you can do to help prevent heart disease  and stroke:  Have your blood pressure checked at least every 1-2 years. High blood pressure causes heart disease and increases the risk of stroke.  If you are 53-22 years old, ask your health care provider if you should take aspirin to prevent a heart attack or a stroke.  Do not use any tobacco products, including cigarettes, chewing tobacco, or electronic cigarettes. If you need help quitting, ask your health care provider.  It is important to eat a healthy diet and maintain a healthy weight. ? Be sure to include plenty of vegetables, fruits, low-fat dairy products, and lean protein. ? Avoid eating foods that are high in solid fats, added sugars, or salt (sodium).  Get regular exercise. This is one of the most important things that you can do for your health. ? Try to exercise for at least 150 minutes each week. The type of exercise that you do should increase your heart rate and make you sweat. This is known as moderate-intensity exercise. ? Try to do strengthening exercises at least twice each week. Do these in addition to the moderate-intensity exercise.  Know your numbers.Ask your health care provider to check your cholesterol and your blood glucose. Continue to have your blood tested as directed by your health care provider.  What should I know about cancer screening? There are several types of cancer. Take the following steps to reduce your risk and to catch any cancer development as early as possible. Breast Cancer  Practice breast self-awareness. ? This means understanding how your breasts normally appear and feel. ? It also means doing regular breast self-exams. Let your health care provider know about any changes, no matter how small.  If you are 40  or older, have a clinician do a breast exam (clinical breast exam or CBE) every year. Depending on your age, family history, and medical history, it may be recommended that you also have a yearly breast X-ray (mammogram).  If you  have a family history of breast cancer, talk with your health care provider about genetic screening.  If you are at high risk for breast cancer, talk with your health care provider about having an MRI and a mammogram every year.  Breast cancer (BRCA) gene test is recommended for women who have family members with BRCA-related cancers. Results of the assessment will determine the need for genetic counseling and BRCA1 and for BRCA2 testing. BRCA-related cancers include these types: ? Breast. This occurs in males or females. ? Ovarian. ? Tubal. This may also be called fallopian tube cancer. ? Cancer of the abdominal or pelvic lining (peritoneal cancer). ? Prostate. ? Pancreatic.  Cervical, Uterine, and Ovarian Cancer Your health care provider may recommend that you be screened regularly for cancer of the pelvic organs. These include your ovaries, uterus, and vagina. This screening involves a pelvic exam, which includes checking for microscopic changes to the surface of your cervix (Pap test).  For women ages 21-65, health care providers may recommend a pelvic exam and a Pap test every three years. For women ages 79-65, they may recommend the Pap test and pelvic exam, combined with testing for human papilloma virus (HPV), every five years. Some types of HPV increase your risk of cervical cancer. Testing for HPV may also be done on women of any age who have unclear Pap test results.  Other health care providers may not recommend any screening for nonpregnant women who are considered low risk for pelvic cancer and have no symptoms. Ask your health care provider if a screening pelvic exam is right for you.  If you have had past treatment for cervical cancer or a condition that could lead to cancer, you need Pap tests and screening for cancer for at least 20 years after your treatment. If Pap tests have been discontinued for you, your risk factors (such as having a new sexual partner) need to be  reassessed to determine if you should start having screenings again. Some women have medical problems that increase the chance of getting cervical cancer. In these cases, your health care provider may recommend that you have screening and Pap tests more often.  If you have a family history of uterine cancer or ovarian cancer, talk with your health care provider about genetic screening.  If you have vaginal bleeding after reaching menopause, tell your health care provider.  There are currently no reliable tests available to screen for ovarian cancer.  Lung Cancer Lung cancer screening is recommended for adults 69-62 years old who are at high risk for lung cancer because of a history of smoking. A yearly low-dose CT scan of the lungs is recommended if you:  Currently smoke.  Have a history of at least 30 pack-years of smoking and you currently smoke or have quit within the past 15 years. A pack-year is smoking an average of one pack of cigarettes per day for one year.  Yearly screening should:  Continue until it has been 15 years since you quit.  Stop if you develop a health problem that would prevent you from having lung cancer treatment.  Colorectal Cancer  This type of cancer can be detected and can often be prevented.  Routine colorectal cancer screening usually begins at  age 42 and continues through age 45.  If you have risk factors for colon cancer, your health care provider may recommend that you be screened at an earlier age.  If you have a family history of colorectal cancer, talk with your health care provider about genetic screening.  Your health care provider may also recommend using home test kits to check for hidden blood in your stool.  A small camera at the end of a tube can be used to examine your colon directly (sigmoidoscopy or colonoscopy). This is done to check for the earliest forms of colorectal cancer.  Direct examination of the colon should be repeated every  5-10 years until age 71. However, if early forms of precancerous polyps or small growths are found or if you have a family history or genetic risk for colorectal cancer, you may need to be screened more often.  Skin Cancer  Check your skin from head to toe regularly.  Monitor any moles. Be sure to tell your health care provider: ? About any new moles or changes in moles, especially if there is a change in a mole's shape or color. ? If you have a mole that is larger than the size of a pencil eraser.  If any of your family members has a history of skin cancer, especially at a Jalon Blackwelder age, talk with your health care provider about genetic screening.  Always use sunscreen. Apply sunscreen liberally and repeatedly throughout the day.  Whenever you are outside, protect yourself by wearing long sleeves, pants, a wide-brimmed hat, and sunglasses.  What should I know about osteoporosis? Osteoporosis is a condition in which bone destruction happens more quickly than new bone creation. After menopause, you may be at an increased risk for osteoporosis. To help prevent osteoporosis or the bone fractures that can happen because of osteoporosis, the following is recommended:  If you are 46-71 years old, get at least 1,000 mg of calcium and at least 600 mg of vitamin D per day.  If you are older than age 55 but younger than age 65, get at least 1,200 mg of calcium and at least 600 mg of vitamin D per day.  If you are older than age 54, get at least 1,200 mg of calcium and at least 800 mg of vitamin D per day.  Smoking and excessive alcohol intake increase the risk of osteoporosis. Eat foods that are rich in calcium and vitamin D, and do weight-bearing exercises several times each week as directed by your health care provider. What should I know about how menopause affects my mental health? Depression may occur at any age, but it is more common as you become older. Common symptoms of depression  include:  Low or sad mood.  Changes in sleep patterns.  Changes in appetite or eating patterns.  Feeling an overall lack of motivation or enjoyment of activities that you previously enjoyed.  Frequent crying spells.  Talk with your health care provider if you think that you are experiencing depression. What should I know about immunizations? It is important that you get and maintain your immunizations. These include:  Tetanus, diphtheria, and pertussis (Tdap) booster vaccine.  Influenza every year before the flu season begins.  Pneumonia vaccine.  Shingles vaccine.  Your health care provider may also recommend other immunizations. This information is not intended to replace advice given to you by your health care provider. Make sure you discuss any questions you have with your health care provider. Document Released: 03/06/2005  Document Revised: 08/02/2015 Document Reviewed: 10/16/2014 Elsevier Interactive Patient Education  2018 Elsevier Inc.  

## 2017-07-28 NOTE — Progress Notes (Signed)
Sarah Jimenez 1960/12/26 [id number]    History:    Presents for annual exam.  Postmenopausal on no HRT with no bleeding.  Normal Pap and mammogram history.  2017- endometrial polyp.  2013- colonoscopy.  HSV no outbreaks.  Rare Xanax use for sleep.  Aware of addictive properties.  One new partner with one encounter in the past year. 2017 normal bone density  Past medical history, past surgical history, family history and social history were all reviewed and documented in the EPIC chart.  4 children all doing okay.  Oldest son high functioning Asperger .  Father died of pancreatic cancer.  Mother healthy.  Originally from [place].  Ex-husband now paying alimony.  ROS:  A ROS was performed and pertinent positives and negatives are included.  Exam:  Vitals:   07/28/17 0902  BP: 110/79  Weight: 138 lb 3.2 oz (62.7 kg)  Height: 5\' 3"  (1.6 m)   Body mass index is 24.48 kg/m.   General appearance:  Normal Thyroid:  Symmetrical, normal in size, without palpable masses or nodularity. Respiratory  Auscultation:  Clear without wheezing or rhonchi Cardiovascular  Auscultation:  Regular rate, without rubs, murmurs or gallops  Edema/varicosities:  Not grossly evident Abdominal  Soft,nontender, without masses, guarding or rebound.  Liver/spleen:  No organomegaly noted  Hernia:  None appreciated  Skin  Inspection:  Grossly normal   Breasts: Examined lying and sitting.     Right: Without masses, retractions, discharge or axillary adenopathy.     Left: Without masses, retractions, discharge or axillary adenopathy. Gentitourinary   Inguinal/mons:  Normal without inguinal adenopathy  External genitalia:  Normal  BUS/Urethra/Skene's glands:  Normal  Vagina:  Normal  Cervix:  Normal  Uterus:  normal in size, shape and contour.  Midline and mobile  Adnexa/parametria:     Rt: Without masses or tenderness.   Lt: Without masses or tenderness.  Anus and perineum: Normal  Digital rectal  exam: Normal sphincter tone without palpated masses or tenderness  Assessment/Plan:  57 y.o. DWF G4, P4 for annual exam with no complaints.  Postmenopausal/no HRT/no bleeding 2017 negative endometrial polyp HSV no outbreaks History of skin cancer- annual dermatology appointments  Plan: SBE's, continue annual screening mammogram, calcium rich foods, vitamin D 2000 daily encouraged.  Continue healthy lifestyle of regular exercise, yoga.  Home safety, fall prevention and importance of continuing weightbearing exercise reviewed.  Xanax 0.25 at bedtime as needed prescription, proper use given and reviewed addictive properties.  Zovirax 200 mg 5 times daily as needed prescription, proper use given and reviewed.  CBC, CMP, hemoglobin A1c, lipid panel, GC/chlamydia.  Pap normal 2018, new screening guidelines reviewed.    [place], 9:16 AM 07/28/2017

## 2017-07-29 LAB — CBC WITH DIFFERENTIAL/PLATELET
Basophils Absolute: 81 cells/uL (ref 0–200)
Basophils Relative: 1.8 %
Eosinophils Absolute: 99 cells/uL (ref 15–500)
Eosinophils Relative: 2.2 %
HCT: 39.2 % (ref 35.0–45.0)
Hemoglobin: 13.6 g/dL (ref 11.7–15.5)
Lymphs Abs: 1224 cells/uL (ref 850–3900)
MCH: 32.5 pg (ref 27.0–33.0)
MCHC: 34.7 g/dL (ref 32.0–36.0)
MCV: 93.8 fL (ref 80.0–100.0)
MPV: 10.2 fL (ref 7.5–12.5)
Monocytes Relative: 9.6 %
Neutro Abs: 2664 cells/uL (ref 1500–7800)
Neutrophils Relative %: 59.2 %
Platelets: 337 10*3/uL (ref 140–400)
RBC: 4.18 10*6/uL (ref 3.80–5.10)
RDW: 12.5 % (ref 11.0–15.0)
Total Lymphocyte: 27.2 %
WBC mixed population: 432 cells/uL (ref 200–950)
WBC: 4.5 10*3/uL (ref 3.8–10.8)

## 2017-07-29 LAB — COMPREHENSIVE METABOLIC PANEL
AG Ratio: 1.6 (calc) (ref 1.0–2.5)
ALT: 15 U/L (ref 6–29)
AST: 21 U/L (ref 10–35)
Albumin: 4.3 g/dL (ref 3.6–5.1)
Alkaline phosphatase (APISO): 60 U/L (ref 33–130)
BUN: 12 mg/dL (ref 7–25)
CO2: 26 mmol/L (ref 20–32)
Calcium: 9.3 mg/dL (ref 8.6–10.4)
Chloride: 102 mmol/L (ref 98–110)
Creat: 0.82 mg/dL (ref 0.50–1.05)
Globulin: 2.7 g/dL (calc) (ref 1.9–3.7)
Glucose, Bld: 90 mg/dL (ref 65–99)
Potassium: 4.7 mmol/L (ref 3.5–5.3)
Sodium: 137 mmol/L (ref 135–146)
Total Bilirubin: 0.7 mg/dL (ref 0.2–1.2)
Total Protein: 7 g/dL (ref 6.1–8.1)

## 2017-07-29 LAB — HEMOGLOBIN A1C
Hgb A1c MFr Bld: 5.4 % of total Hgb (ref <5.7)
Mean Plasma Glucose: 108 (calc)
eAG (mmol/L): 6 (calc)

## 2017-07-29 LAB — LIPID PANEL
Cholesterol: 180 mg/dL (ref <200)
HDL: 83 mg/dL (ref >50)
LDL Cholesterol (Calc): 83 mg/dL (calc)
Non-HDL Cholesterol (Calc): 97 mg/dL (calc) (ref <130)
Total CHOL/HDL Ratio: 2.2 (calc) (ref <5.0)
Triglycerides: 59 mg/dL (ref <150)

## 2017-07-30 LAB — C. TRACHOMATIS/N. GONORRHOEAE RNA
C. trachomatis RNA, TMA: NOT DETECTED
N. gonorrhoeae RNA, TMA: NOT DETECTED

## 2017-09-06 DIAGNOSIS — H02821 Cysts of right upper eyelid: Secondary | ICD-10-CM | POA: Diagnosis not present

## 2017-11-24 DIAGNOSIS — L570 Actinic keratosis: Secondary | ICD-10-CM | POA: Diagnosis not present

## 2017-11-24 DIAGNOSIS — D485 Neoplasm of uncertain behavior of skin: Secondary | ICD-10-CM | POA: Diagnosis not present

## 2017-11-24 DIAGNOSIS — Z85828 Personal history of other malignant neoplasm of skin: Secondary | ICD-10-CM | POA: Diagnosis not present

## 2017-11-24 DIAGNOSIS — D1801 Hemangioma of skin and subcutaneous tissue: Secondary | ICD-10-CM | POA: Diagnosis not present

## 2017-11-24 DIAGNOSIS — L821 Other seborrheic keratosis: Secondary | ICD-10-CM | POA: Diagnosis not present

## 2017-11-24 DIAGNOSIS — L814 Other melanin hyperpigmentation: Secondary | ICD-10-CM | POA: Diagnosis not present

## 2017-11-24 DIAGNOSIS — D2262 Melanocytic nevi of left upper limb, including shoulder: Secondary | ICD-10-CM | POA: Diagnosis not present

## 2018-03-21 DIAGNOSIS — L570 Actinic keratosis: Secondary | ICD-10-CM | POA: Diagnosis not present

## 2018-03-21 DIAGNOSIS — Z85828 Personal history of other malignant neoplasm of skin: Secondary | ICD-10-CM | POA: Diagnosis not present

## 2018-05-05 ENCOUNTER — Encounter: Payer: Self-pay | Admitting: Women's Health

## 2018-06-21 ENCOUNTER — Other Ambulatory Visit: Payer: Self-pay

## 2018-06-21 DIAGNOSIS — F4322 Adjustment disorder with anxiety: Secondary | ICD-10-CM

## 2018-06-22 MED ORDER — ALPRAZOLAM 0.25 MG PO TABS: 0.2500 mg | ORAL_TABLET | Freq: Every evening | ORAL | 1 refills | Status: DC | PRN

## 2018-06-22 NOTE — Telephone Encounter (Signed)
Ok for refill? 

## 2018-06-22 NOTE — Telephone Encounter (Signed)
Called into pharmacy

## 2018-07-01 ENCOUNTER — Other Ambulatory Visit: Payer: Self-pay | Admitting: Women's Health

## 2018-07-01 DIAGNOSIS — Z1231 Encounter for screening mammogram for malignant neoplasm of breast: Secondary | ICD-10-CM

## 2018-07-12 ENCOUNTER — Ambulatory Visit (INDEPENDENT_AMBULATORY_CARE_PROVIDER_SITE_OTHER): Payer: BC Managed Care – PPO | Admitting: Gastroenterology

## 2018-07-12 ENCOUNTER — Other Ambulatory Visit: Payer: Self-pay

## 2018-07-12 ENCOUNTER — Encounter: Payer: Self-pay | Admitting: Gastroenterology

## 2018-07-12 VITALS — Ht 62.0 in | Wt 142.0 lb

## 2018-07-12 DIAGNOSIS — K529 Noninfective gastroenteritis and colitis, unspecified: Secondary | ICD-10-CM

## 2018-07-12 MED ORDER — NA SULFATE-K SULFATE-MG SULF 17.5-3.13-1.6 GM/177ML PO SOLN: 1.0000 | Freq: Once | ORAL | 0 refills | Status: AC

## 2018-07-12 NOTE — Patient Instructions (Addendum)
I have recommended several stool and blood tests as well as a colonoscopy to evaluate your diarrhea.  Drink a lot of liquids that have water, salt, and sugar. Good choices are water mixed with juice, flavored soda, and soup broth. If you are drinking enough, your urine will be light yellow or almost clear.  Avoid high fat foods, as they can make diarrhea worse.   Dairy products (except yogurt and hard cheeses) may be difficult to digest when you have diarrhea. I recommend that you temporarily avoid lactose-containing foods.   I recommend a trial of loperamide 4 mg initially, then 2 mg after each unformed stool for ?2 days, with a maximum of 16 mg/day.  If that is not working, you could try bismuth salicylate (Pepto-Bismol) 30 mL or two tablets every 30 minutes for eight doses.  A daily probiotic may be very helpful.  Call with any concerns prior to the colonoscopy  [email or web address] has useful information about diarrhea and microscopic colitis.

## 2018-07-12 NOTE — Progress Notes (Signed)
TELEHEALTH VISIT  Referring Provider: Huel Cote, NP Primary Care Physician:  Huel Cote, NP   Tele-visit due to COVID-19 pandemic Patient requested visit virtually, consented to the virtual encounter via video enabled telemedicine application (Zoom) Contact made at: 15:45 07/12/18 Patient verified by name and date of birth Location of patient: Home Location provider:  medical office Names of persons participating: Me, patient, Sarah Jimenez CMA Time spent on telehealth visit: 31 minutes I discussed the limitations of evaluation and management by telemedicine. The patient expressed understanding and agreed to proceed.  Reason for Consultation:  Change in bowel habits   IMPRESSION:  Diarrhea x 1 years  The differential diagnosis of chronic diarrhea without alarm features includes: irritable bowel syndrome, IBD, celiac disease, missed infection (such as giardia), food intolerance,microscopic colitis, thyroid disorder, other functional GI disease.   PLAN: - Fecal calprotectin, ESR, and CRP to screen for IBD - Giardia testing - TSH - Avoid carbonated beverages, artificial sweeteners, and dairy - Colonoscopy with random biopsies and evaluation of the terminal ileum  I consented the patient at the bedside today discussing the risks, benefits, and alternatives to endoscopic evaluation. In particular, we discussed the risks that include, but are not limited to, reaction to medication, cardiopulmonary compromise, bleeding requiring blood transfusion, aspiration resulting in pneumonia, perforation requiring surgery, and even death. We reviewed the risk of missed lesion including polyps or even cancer. The patient acknowledges these risks and asks that we proceed.   HPI: Sarah Jimenez is a 58 y.o. female self-referred for diarrhea. The history is obtained through the patient and review of her electronic health record.  History of granula angioma treated with cortisone  cream, endometrial polyp removed in 2017. Chronic right hip pain x 6 months and prior shoulder pain.   Diarrhea since she moved last year. Frequent post-prandial defecation. Now having 5-6 BM daily. Baseline habits are one BM each morning after breakfast. Diarrhea is frequently liquid, explosive with urgency. No blood or mucous. Good appetite. Unintentional weight gain. No associated abdominal pain, abdominal cramping, or bloating. No fever, chills, night sweats.   Tried to eliminate red wine, coffee. She did not avoid gluten or dairy. Following a bland diet. None of these dietary efforts improved her symptoms.   Symptoms have improved in last 24 hour after she started drinking botted water.  Denies a precipitating event, trauma, close contacts with similar symptoms, changes in diet, recent travel or antibiotic use.  No other associated symptoms. No identified exacerbating or relieving features.   Normal screening colonoscopy with Dr. Olevia Perches 05/08/11.   Son with IBS. [place] with IBS in college presenting with diarrhea for a year. No known family history of colon cancer or polyps. Father with pancreatic cancer at age 32. No family history of uterine/endometrial cancer, pancreatic cancer or gastric/stomach cancer.  Past Medical History:  Diagnosis Date  . Cancer ([place])    Pre cancer skin  . Heart murmur     Past Surgical History:  Procedure Laterality Date  . DILATATION & CURETTAGE/HYSTEROSCOPY WITH MYOSURE N/A 01/01/2015   Procedure: DILATATION & CURETTAGE/HYSTEROSCOPY WITH MYOSURE;  Surgeon: Terrance Mass, MD;  Location: [facility];  Service: Gynecology;  Laterality: N/A;  . pyloric stenosis  1960-07-15    Current Outpatient Medications  Medication Sig Dispense Refill  . acyclovir (ZOVIRAX) 200 MG capsule Take 1 capsule (200 mg total) by mouth 5 (five) times daily. As needed 90 capsule 1  . ALPRAZolam (XANAX) 0.25 MG tablet Take 1  tablet (0.25 mg total) by mouth at bedtime as needed. for sleep  60 tablet 1  . Cholecalciferol (VITAMIN D PO) Take 1 tablet by mouth daily.    Marland Kitchen ibuprofen (ADVIL,MOTRIN) 200 MG tablet Take 400 mg by mouth every 6 (six) hours as needed for moderate pain.     No current facility-administered medications for this visit.     Allergies as of 07/12/2018  . (No Known Allergies)    Family History  Problem Relation Age of Onset  . Hypertension Father   . Cancer Father 66       pancreatic  . Hypertension Brother   . Heart disease Maternal Grandfather   . Colon cancer Paternal Uncle 80  . Stomach cancer Neg Hx   . Esophageal cancer Neg Hx     Social History   Socioeconomic History  . Marital status: Single    Spouse name: Not on file  . Number of children: Not on file  . Years of education: Not on file  . Highest education level: Not on file  Occupational History  . Not on file  Social Needs  . Financial resource strain: Not on file  . Food insecurity    Worry: Not on file    Inability: Not on file  . Transportation needs    Medical: Not on file    Non-medical: Not on file  Tobacco Use  . Smoking status: Never Smoker  . Smokeless tobacco: Never Used  Substance and Sexual Activity  . Alcohol use: Yes    Alcohol/week: 2.0 standard drinks    Types: 2 Glasses of wine per week    Comment: LITTLE  . Drug use: Yes    Comment: Edible Marijuana tried while in [place]  . Sexual activity: Not Currently    Birth control/protection: Other-see comments    Comment: vasectomy, INTERCOURSE AGE 41, SEXUAL PARTNERS MORE THAN 5  Lifestyle  . Physical activity    Days per week: Not on file    Minutes per session: Not on file  . Stress: Not on file  Relationships  . Social Herbalist on phone: Not on file    Gets together: Not on file    Attends religious service: Not on file    Active member of club or organization: Not on file    Attends meetings of clubs or organizations: Not on file    Relationship status: Not on file  . Intimate  partner violence    Fear of current or ex partner: Not on file    Emotionally abused: Not on file    Physically abused: Not on file    Forced sexual activity: Not on file  Other Topics Concern  . Not on file  Social History Narrative  . Not on file    Review of Systems: ALL ROS discussed and all others negative except listed in HPI.  Physical Exam: Complete physical exam not performed due to the limits inherent in a telehealth encounter.  General: Awake, alert, and oriented, and well communicative. In no acute distress.  HEENT: EOMI, non-icteric sclera, NCAT, MMM  Neck: Normal movement of head and neck  Pulm: No labored breathing, speaking in full sentences without conversational dyspnea  Derm: No apparent lesions or bruising in visible field  MS: Moves all visible extremities without noticeable abnormality  Psych: Pleasant, cooperative, normal speech, normal affect and normal insight Neuro: Alert and appropriate   Sarah Jimenez L. Tarri Glenn, MD, MPH [facility] 07/12/2018, 11:33 AM

## 2018-07-26 ENCOUNTER — Telehealth: Payer: Self-pay | Admitting: Gastroenterology

## 2018-07-26 NOTE — Telephone Encounter (Signed)
Pt requested a call back to discuss colonoscopy prep.

## 2018-07-28 ENCOUNTER — Other Ambulatory Visit (INDEPENDENT_AMBULATORY_CARE_PROVIDER_SITE_OTHER): Payer: BC Managed Care – PPO

## 2018-07-28 ENCOUNTER — Other Ambulatory Visit: Payer: Self-pay

## 2018-07-28 DIAGNOSIS — K529 Noninfective gastroenteritis and colitis, unspecified: Secondary | ICD-10-CM

## 2018-07-28 LAB — SEDIMENTATION RATE: Sed Rate: 13 mm/hr (ref 0–30)

## 2018-07-28 LAB — TSH: TSH: <0.01 u[IU]/mL — ABNORMAL LOW (ref 0.35–4.50)

## 2018-07-28 LAB — C-REACTIVE PROTEIN: CRP: <1 mg/dL (ref 0.5–20.0)

## 2018-07-28 NOTE — Telephone Encounter (Signed)
Pt returned call. Answered all questions regarding prep and labs ordered.

## 2018-07-28 NOTE — Telephone Encounter (Signed)
Returned pts call. Had to leave a msg.

## 2018-08-01 ENCOUNTER — Telehealth: Payer: Self-pay | Admitting: Gastroenterology

## 2018-08-01 ENCOUNTER — Encounter: Payer: Self-pay | Admitting: Women's Health

## 2018-08-01 ENCOUNTER — Other Ambulatory Visit: Payer: Self-pay

## 2018-08-01 ENCOUNTER — Ambulatory Visit (INDEPENDENT_AMBULATORY_CARE_PROVIDER_SITE_OTHER): Payer: BC Managed Care – PPO | Admitting: Women's Health

## 2018-08-01 DIAGNOSIS — R946 Abnormal results of thyroid function studies: Secondary | ICD-10-CM

## 2018-08-01 DIAGNOSIS — Z01419 Encounter for gynecological examination (general) (routine) without abnormal findings: Secondary | ICD-10-CM | POA: Diagnosis not present

## 2018-08-01 DIAGNOSIS — B009 Herpesviral infection, unspecified: Secondary | ICD-10-CM

## 2018-08-01 MED ORDER — ACYCLOVIR 200 MG PO CAPS: ORAL_CAPSULE | ORAL | 1 refills | Status: DC

## 2018-08-01 NOTE — Telephone Encounter (Signed)
She may cancel the endoscopy for now. Knowing that she may still need it if things aren't improving despite improvement in her thryoid function, that she would need to have a colonoscopy at that time. Thank you.

## 2018-08-01 NOTE — Telephone Encounter (Signed)
Patient called in wanting to ask Dr.Beavers per their last conversation Thursday do she still need to keep the appt for the colonoscopy? Please call and advise thanks.

## 2018-08-01 NOTE — Telephone Encounter (Signed)
Spoke to the patient who wanted to know if it was Dr. Tarri Glenn recommendation to proceed with her colonoscopy that is scheduled 7/14? The patient said that it was discussed that the problem could not be her colon and could be her thyroid. The colonoscopy was still in the plan per the office note on 07/12/2018. Is a colonoscopy still in the plan for this patient? Please advise.    FYI--Information faxed to Dr. Philemon Kingdom of [facility] at [phone].

## 2018-08-01 NOTE — Telephone Encounter (Signed)
Notified patient that Dr. Tarri Glenn recommended that the colonoscopy be cancelled until she could be followed up by endocrinology. Patient aware that she may need to be scheduled for colonoscopy at a later date. Patient notified that information was faxed to Dr. Cruzita Lederer. [facility] address and number given to patient.  Nothing further at the time of the call.

## 2018-08-01 NOTE — Progress Notes (Signed)
Sarah Jimenez 08-Aug-1960 [id number]    History:    Presents for annual exam with diarrhea and abdominal cramping. Has been experiencing watery diarrhea for 1 year, now after every meal. Was evaluated by Dr. Tarri Glenn with [facility] and found to have TSH < 0.01. No family history of thyroid issues. No new partners. Postmenopausal on no HRT with no bleeding. Normal pap and mammogram history. 2017 endometrial polyp. 2013 negative colonoscopy. 2017 DEXA scan normal.   Past medical history, past surgical history, family history and social history were all reviewed and documented in the EPIC chart. Works at [employer] in [place]. 4 children doing well. Oldest son high functioning Asperger. Originally from [place]. Father died of pancreatic cancer. Mother out of assisted living at home due to [facility].  ROS:  A ROS was performed and pertinent positives and negatives are included.  Exam:  Vitals:   08/01/18 1617  BP: 120/82  Weight: 141 lb (64 kg)  Height: 5\' 2"  (1.575 m)   Body mass index is 25.79 kg/m.   General appearance:  Normal Thyroid:  Symmetrical, normal in size, without palpable masses or nodularity. Respiratory  Auscultation:  Clear without wheezing or rhonchi Cardiovascular  Auscultation:  Regular rate, without rubs, murmurs or gallops  Edema/varicosities:  Not grossly evident Abdominal  Soft,nontender, without masses, guarding or rebound.  Liver/spleen:  No organomegaly noted  Hernia:  None appreciated  Skin  Inspection:  Grossly normal   Breasts: Examined lying and sitting.     Right: Without masses, retractions, discharge or axillary adenopathy.     Left: Without masses, retractions, discharge or axillary adenopathy. Gentitourinary   Inguinal/mons:  Normal without inguinal adenopathy  External genitalia:  Normal  BUS/Urethra/Skene's glands:  Normal  Vagina:  Atrophic  Cervix:  Normal  Uterus:  normal in size, shape and contour.  Midline and  mobile  Adnexa/parametria:     Rt: Without masses or tenderness.   Lt: Without masses or tenderness.  Anus and perineum: Normal  Assessment/Plan:  58 y.o.  [name] for annual exam with diarrhea and abdominal cramping.  Diarrhea GI evaluated questionable hyperthyroid related has been referred for endocrinology evaluation Postmenopausal on no HRT with no bleeding 2017 negative endometrial polyp HSV-no outbreaks History of skin cancer- annual dermatology appointments   Plan: Referral for Dr. Cruzita Lederer, endocrinologist.  Aware to call if no contact from endocrinologist office..  Encouraged continued annual mammogram, scheduled, SBE's, calcium rich foods, Vitamin D 2000 mg daily, continue regular weightbearing exercise/tennis, and healthy eating habits. Zovirax 200 mg 5 times daily for 3-5 days as needed prescription. Pap normal 2018, new screening guidelines reviewed.    Sarah Jimenez, 4:56 PM 08/01/2018

## 2018-08-09 ENCOUNTER — Encounter: Payer: BC Managed Care – PPO | Admitting: Gastroenterology

## 2018-08-12 ENCOUNTER — Ambulatory Visit
Admission: RE | Admit: 2018-08-12 | Discharge: 2018-08-12 | Disposition: A | Discharge status: Home / Self Care | Payer: BC Managed Care – PPO | Source: Ambulatory Visit | Attending: Women's Health | Admitting: Women's Health

## 2018-08-12 ENCOUNTER — Other Ambulatory Visit: Payer: Self-pay

## 2018-08-12 DIAGNOSIS — Z1231 Encounter for screening mammogram for malignant neoplasm of breast: Secondary | ICD-10-CM

## 2018-08-15 ENCOUNTER — Encounter: Payer: Self-pay | Admitting: Women's Health

## 2018-08-15 ENCOUNTER — Other Ambulatory Visit: Payer: Self-pay

## 2018-08-16 ENCOUNTER — Encounter: Payer: Self-pay | Admitting: Internal Medicine

## 2018-08-16 ENCOUNTER — Other Ambulatory Visit: Payer: Self-pay

## 2018-08-16 ENCOUNTER — Ambulatory Visit (INDEPENDENT_AMBULATORY_CARE_PROVIDER_SITE_OTHER): Payer: BC Managed Care – PPO | Admitting: Internal Medicine

## 2018-08-16 DIAGNOSIS — E0590 Thyrotoxicosis, unspecified without thyrotoxic crisis or storm: Secondary | ICD-10-CM

## 2018-08-16 LAB — T4, FREE: Free T4: 1.25 ng/dL (ref 0.60–1.60)

## 2018-08-16 LAB — T3, FREE: T3, Free: 5.1 pg/mL — ABNORMAL HIGH (ref 2.3–4.2)

## 2018-08-16 LAB — TSH: TSH: <0.01 u[IU]/mL — ABNORMAL LOW (ref 0.35–4.50)

## 2018-08-16 NOTE — Progress Notes (Signed)
Patient ID: Sarah Jimenez, female   DOB: [birth year], 58 y.o.   MRN: [id number]   HPI  Sarah Jimenez is a 58 y.o.-year-old female, referred by her gastroenterologist, Dr. Tarri Glenn, for evaluation and management of newly diagnosed thyrotoxicosis.  Patient was recently found to have an undetectable TSH during investigation for chronic diarrhea - started 04/2017.  She was referred emergently to endocrinology.  Of note, previous TSH levels from 2013 and 2014 were normal.  I reviewed pt's thyroid tests: Lab Results  Component Value Date   TSH <0.01 Repeated and verified X2. (L) 07/28/2018   TSH 2.907 07/04/2012   TSH 1.810 07/03/2011   Antithyroid antibodies: No results found for: TSI  Pt denies: - feeling nodules in neck - dysphagia - choking - SOB with lying down But she does have hoarseness towards the end of the day.  She has: - diarrhea >> this is improved recently after changing her diet: She cut out meat and cutting out dairy also -now eating only a little cheese - Occasional palpitations - joint pain - fatigue - had tremors, but these have resolved - had mild wt loss (intentional, after improving her diet) - insomnia  Pt does not have a FH of thyroid ds. No FH of thyroid cancer. No h/o radiation tx to head or neck.  No seaweed or kelp, no recent contrast studies. No steroid use. No herbal supplements. No Biotin use.  Of note, a DXA scan was normal in 2017.  She is usually active, walking 4 miles a day and maintenance.  ROS: Constitutional: + see HPI Eyes: no blurry vision, no xerophthalmia ENT: + sore throat, + see HPI Cardiovascular: no CP/SOB/+ palpitations/no leg swelling Respiratory: no cough/SOB Gastrointestinal: no N/V/+ D/no C Musculoskeletal: no muscle/+ joint aches Skin: + Rash-erythema annulare on legs Neurological: no tremors/numbness/tingling/dizziness Psychiatric: no depression/anxiety  Past Medical History:  Diagnosis Date  . Cancer ([place])     Pre cancer skin  . Heart murmur    Past Surgical History:  Procedure Laterality Date  . DILATATION & CURETTAGE/HYSTEROSCOPY WITH MYOSURE N/A 01/01/2015   Procedure: DILATATION & CURETTAGE/HYSTEROSCOPY WITH MYOSURE;  Surgeon: Terrance Mass, MD;  Location: [facility];  Service: Gynecology;  Laterality: N/A;  . pyloric stenosis  1962   Social History   Socioeconomic History  . Marital status: Single    Spouse name: Not on file  . Number of children: 4  . Years of education: Not on file  . Highest education level: Not on file  Occupational History  .  Communication coordinator  Tobacco Use  . Smoking status: Never Smoker  . Smokeless tobacco: Never Used  Substance and Sexual Activity  . Alcohol use: Yes    Alcohol/week: 2.0 standard drinks    Types: 2 Glasses of wine per week    Comment: LITTLE  . Drug use: Yes    Comment: Edible Marijuana tried while in [place]  . Sexual activity: Not Currently    Birth control/protection: Other-see comments    Comment: vasectomy, INTERCOURSE AGE 83, SEXUAL PARTNERS MORE THAN 5   Current Outpatient Medications on File Prior to Visit  Medication Sig Dispense Refill  . acyclovir (ZOVIRAX) 200 MG capsule Take 5 times daily for 3-5 days as needed 90 capsule 1  . ALPRAZolam (XANAX) 0.25 MG tablet Take 1 tablet (0.25 mg total) by mouth at bedtime as needed. for sleep 60 tablet 1  . Cholecalciferol (VITAMIN D PO) Take 1 tablet by mouth daily.    Marland Kitchen  ibuprofen (ADVIL,MOTRIN) 200 MG tablet Take 400 mg by mouth every 6 (six) hours as needed for moderate pain.     No current facility-administered medications on file prior to visit.    No Known Allergies Family History  Problem Relation Age of Onset  . Hypertension Father   . Cancer Father 1       pancreatic  . Hypertension Brother   . Heart disease Maternal Grandfather   . Colon cancer Paternal Uncle 46  . Stomach cancer Neg Hx   . Esophageal cancer Neg Hx     PE: BP 120/70   Pulse 66    Ht 5\' 2"  (1.575 m)   Wt 140 lb (63.5 kg)   SpO2 99%   BMI 25.61 kg/m  Wt Readings from Last 3 Encounters:  08/16/18 140 lb (63.5 kg)  08/01/18 141 lb (64 kg)  07/12/18 142 lb (64.4 kg)   Constitutional: normal weight, in NAD Eyes: PERRLA, EOMI, no exophthalmos, no lid lag, no stare ENT: moist mucous membranes, no thyromegaly, no thyroid bruits, no cervical lymphadenopathy Cardiovascular: RRR, No MRG Respiratory: CTA B Gastrointestinal: abdomen soft, NT, ND, BS+ Musculoskeletal: no deformities, strength intact in all 4 Skin: moist, warm, no rashes Neurological: no tremor with outstretched hands, DTR normal in all 4  ASSESSMENT: 1. Thyrotoxicosis  PLAN:  1. Patient with a recently found low TSH, with thyrotoxic sxs: hyperdefecation/diarrhea, palpitations, but no unintentional weight loss, anxiety, heat intolerance.  - she does not appear to have exogenous causes for the low TSH.  - We discussed that possible causes of thyrotoxicosis are:  Marland Kitchen Graves ds   . Thyroiditis . toxic multinodular goiter/ toxic adenoma (I cannot feel nodules at palpation of her thyroid). - will check the TSH, fT3 and fT4 and also add thyroid stimulating antibodies to screen for Graves' disease.  - If the tests remain abnormal, we may need an uptake and scan to differentiate between the 3 above possible etiologies  - we discussed about possible modalities of treatment for the above conditions, to include methimazole use, radioactive iodine ablation or (last resort) surgery. - we may need to do thyroid ultrasound depending on the results of the uptake and scan (if a cold nodule is present) - I do not feel that we need to add beta blockers at this time, since she is not tachycardic, but I would like to decide about this after her results are back.  She does have some palpitations but her heart rate is low, at 66. - She not have signs of Graves' ophthalmopathy; she does not have any double vision, blurry vision,  eye pain, chemosis. - RTC in 3-4 months, but likely sooner for repeat labs  Component     Latest Ref Rng & Units 08/16/2018  TSH     0.35 - 4.50 uIU/mL <0.01 (L)  T4,Free(Direct)     0.60 - 1.60 ng/dL 1.25  Triiodothyronine,Free,Serum     2.3 - 4.2 pg/mL 5.1 (H)  TSI     <140 % baseline 112   Graves' antibodies are not elevated.  TSH is still undetectable while free T4 is normal and free T3 is slightly elevated. At this point, the etiology of her thyrotoxicosis is not clear.  However, since she is asymptomatic, I would like to repeat her tests in 4 weeks to see if they improve.  If so, it is possible that she is experiencing an episode of thyroiditis.  Due to the low heart rate, I would not suggest  a beta-blocker for now.  Philemon Kingdom, MD PhD [facility]

## 2018-08-16 NOTE — Patient Instructions (Signed)
Please stop at the lab.  Please come back for a follow-up appointment in 3-4 months.   Hyperthyroidism  Hyperthyroidism is when the thyroid gland is too active (overactive). The thyroid gland is a small gland located in the lower front part of the neck, just in front of the windpipe (trachea). This gland makes hormones that help control how the body uses food for energy (metabolism) as well as how the heart and brain function. These hormones also play a role in keeping your bones strong. When the thyroid is overactive, it produces too much of a hormone called thyroxine. What are the causes? This condition may be caused by:  Graves' disease. This is a disorder in which the body's disease-fighting system (immune system) attacks the thyroid gland. This is the most common cause.  Inflammation of the thyroid gland.  A tumor in the thyroid gland.  Use of certain medicines, including: ? Prescription thyroid hormone replacement. ? Herbal supplements that mimic thyroid hormones. ? Amiodarone therapy.  Solid or fluid-filled lumps within your thyroid gland (thyroid nodules).  Taking in a large amount of iodine from foods or medicines. What increases the risk? You are more likely to develop this condition if:  You are female.  You have a family history of thyroid conditions.  You smoke tobacco.  You use a medicine called lithium.  You take medicines that affect the immune system (immunosuppressants). What are the signs or symptoms? Symptoms of this condition include:  Nervousness.  Inability to tolerate heat.  Unexplained weight loss.  Diarrhea.  Change in the texture of hair or skin.  Heart skipping beats or making extra beats.  Rapid heart rate.  Loss of menstruation.  Shaky hands.  Fatigue.  Restlessness.  Sleep problems.  Enlarged thyroid gland or a lump in the thyroid (nodule). You may also have symptoms of Graves' disease, which may include:  Protruding  eyes.  Dry eyes.  Red or swollen eyes.  Problems with vision. How is this diagnosed? This condition may be diagnosed based on:  Your symptoms and medical history.  A physical exam.  Blood tests.  Thyroid ultrasound. This test involves using sound waves to produce images of the thyroid gland.  A thyroid scan. A radioactive substance is injected into a vein, and images show how much iodine is present in the thyroid.  Radioactive iodine uptake test (RAIU). A small amount of radioactive iodine is given by mouth to see how much iodine the thyroid absorbs after a certain amount of time. How is this treated? Treatment depends on the cause and severity of the condition. Treatment may include:  Medicines to reduce the amount of thyroid hormone your body makes.  Radioactive iodine treatment (radioiodine therapy). This involves swallowing a small dose of radioactive iodine, in capsule or liquid form, to kill thyroid cells.  Surgery to remove part or all of your thyroid gland. You may need to take thyroid hormone replacement medicine for the rest of your life after thyroid surgery.  Medicines to help manage your symptoms. Follow these instructions at home:   Take over-the-counter and prescription medicines only as told by your health care provider.  Do not use any products that contain nicotine or tobacco, such as cigarettes and e-cigarettes. If you need help quitting, ask your health care provider.  Follow any instructions from your health care provider about diet. You may be instructed to limit foods that contain iodine.  Keep all follow-up visits as told by your health care provider. This  is important. ? You will need to have blood tests regularly so that your health care provider can monitor your condition. Contact a health care provider if:  Your symptoms do not get better with treatment.  You have a fever.  You are taking thyroid hormone replacement medicine and you: ? Have  symptoms of depression. ? Feel like you are tired all the time. ? Gain weight. Get help right away if:  You have chest pain.  You have decreased alertness or a change in your awareness.  You have abdominal pain.  You feel dizzy.  You have a rapid heartbeat.  You have an irregular heartbeat.  You have difficulty breathing. Summary  The thyroid gland is a small gland located in the lower front part of the neck, just in front of the windpipe (trachea).  Hyperthyroidism is when the thyroid gland is too active (overactive) and produces too much of a hormone called thyroxine.  The most common cause is Graves' disease, a disorder in which your immune system attacks the thyroid gland.  Hyperthyroidism can cause various symptoms, such as unexplained weight loss, nervousness, inability to tolerate heat, or changes in your heartbeat.  Treatment may include medicine to reduce the amount of thyroid hormone your body makes, radioiodine therapy, surgery, or medicines to manage symptoms. This information is not intended to replace advice given to you by your health care provider. Make sure you discuss any questions you have with your health care provider. Document Released: 01/12/2005 Document Revised: 12/25/2016 Document Reviewed: 12/23/2016 Elsevier Patient Education  2020 [employer].

## 2018-08-19 ENCOUNTER — Other Ambulatory Visit: Payer: Self-pay | Admitting: Internal Medicine

## 2018-08-19 ENCOUNTER — Telehealth: Payer: Self-pay | Admitting: Internal Medicine

## 2018-08-19 DIAGNOSIS — E0590 Thyrotoxicosis, unspecified without thyrotoxic crisis or storm: Secondary | ICD-10-CM

## 2018-08-19 LAB — THYROID STIMULATING IMMUNOGLOBULIN: TSI: 112 % baseline (ref <140)

## 2018-08-19 NOTE — Telephone Encounter (Signed)
Patient requests to be called at ph# [phone] to be given her lab results.

## 2018-08-19 NOTE — Telephone Encounter (Signed)
I am still waiting for one to come back

## 2018-09-15 ENCOUNTER — Other Ambulatory Visit: Payer: Self-pay | Admitting: Internal Medicine

## 2018-09-15 ENCOUNTER — Other Ambulatory Visit (INDEPENDENT_AMBULATORY_CARE_PROVIDER_SITE_OTHER): Payer: BC Managed Care – PPO

## 2018-09-15 ENCOUNTER — Other Ambulatory Visit: Payer: Self-pay

## 2018-09-15 DIAGNOSIS — E0590 Thyrotoxicosis, unspecified without thyrotoxic crisis or storm: Secondary | ICD-10-CM | POA: Diagnosis not present

## 2018-09-15 LAB — T3, FREE: T3, Free: 6.1 pg/mL — ABNORMAL HIGH (ref 2.3–4.2)

## 2018-09-15 LAB — TSH: TSH: <0.01 u[IU]/mL — ABNORMAL LOW (ref 0.35–4.50)

## 2018-09-15 LAB — T4, FREE: Free T4: 1.58 ng/dL (ref 0.60–1.60)

## 2018-09-15 MED ORDER — METHIMAZOLE 5 MG PO TABS: 5.0000 mg | ORAL_TABLET | Freq: Every day | ORAL | 3 refills | Status: DC

## 2018-09-26 ENCOUNTER — Telehealth: Payer: Self-pay | Admitting: Internal Medicine

## 2018-09-26 NOTE — Telephone Encounter (Signed)
Has not heard from [facility] regarding the scan that was ordered

## 2018-09-27 DIAGNOSIS — E0500 Thyrotoxicosis with diffuse goiter without thyrotoxic crisis or storm: Secondary | ICD-10-CM

## 2018-09-27 HISTORY — DX: Thyrotoxicosis with diffuse goiter without thyrotoxic crisis or storm: E05.00

## 2018-10-06 ENCOUNTER — Other Ambulatory Visit (HOSPITAL_COMMUNITY): Payer: BC Managed Care – PPO

## 2018-10-06 ENCOUNTER — Ambulatory Visit (HOSPITAL_COMMUNITY): Payer: BC Managed Care – PPO

## 2018-10-07 ENCOUNTER — Other Ambulatory Visit (HOSPITAL_COMMUNITY): Payer: BC Managed Care – PPO

## 2018-10-11 NOTE — Telephone Encounter (Signed)
Patient scheduled.

## 2018-10-13 ENCOUNTER — Encounter (HOSPITAL_COMMUNITY)
Admission: RE | Admit: 2018-10-13 | Discharge: 2018-10-13 | Disposition: A | Discharge status: Home / Self Care | Payer: BC Managed Care – PPO | Source: Ambulatory Visit | Attending: Internal Medicine | Admitting: Internal Medicine

## 2018-10-13 ENCOUNTER — Encounter (HOSPITAL_COMMUNITY): Payer: BC Managed Care – PPO

## 2018-10-13 ENCOUNTER — Other Ambulatory Visit: Payer: Self-pay

## 2018-10-13 DIAGNOSIS — E0590 Thyrotoxicosis, unspecified without thyrotoxic crisis or storm: Secondary | ICD-10-CM | POA: Diagnosis not present

## 2018-10-13 MED ORDER — SODIUM IODIDE I-123 7.4 MBQ CAPS
433.0000 | ORAL_CAPSULE | Freq: Once | ORAL | Status: AC
  Administered 2018-10-13: 433 via ORAL

## 2018-10-14 ENCOUNTER — Other Ambulatory Visit: Payer: Self-pay | Admitting: Internal Medicine

## 2018-10-14 ENCOUNTER — Encounter (HOSPITAL_COMMUNITY)
Admission: RE | Admit: 2018-10-14 | Discharge: 2018-10-14 | Disposition: A | Discharge status: Home / Self Care | Payer: BC Managed Care – PPO | Source: Ambulatory Visit | Attending: Internal Medicine | Admitting: Internal Medicine

## 2018-10-14 ENCOUNTER — Encounter (HOSPITAL_COMMUNITY): Admission: RE | Admit: 2018-10-14 | Payer: BC Managed Care – PPO | Source: Ambulatory Visit

## 2018-10-14 DIAGNOSIS — E0590 Thyrotoxicosis, unspecified without thyrotoxic crisis or storm: Secondary | ICD-10-CM

## 2018-10-14 DIAGNOSIS — E0500 Thyrotoxicosis with diffuse goiter without thyrotoxic crisis or storm: Secondary | ICD-10-CM

## 2018-10-24 ENCOUNTER — Other Ambulatory Visit: Payer: BC Managed Care – PPO

## 2018-10-31 ENCOUNTER — Other Ambulatory Visit: Payer: Self-pay

## 2018-10-31 ENCOUNTER — Other Ambulatory Visit (INDEPENDENT_AMBULATORY_CARE_PROVIDER_SITE_OTHER): Payer: BC Managed Care – PPO

## 2018-10-31 DIAGNOSIS — E0500 Thyrotoxicosis with diffuse goiter without thyrotoxic crisis or storm: Secondary | ICD-10-CM | POA: Diagnosis not present

## 2018-10-31 LAB — TSH: TSH: <0.01 u[IU]/mL — ABNORMAL LOW (ref 0.35–4.50)

## 2018-10-31 LAB — T4, FREE: Free T4: 1.06 ng/dL (ref 0.60–1.60)

## 2018-10-31 LAB — T3, FREE: T3, Free: 4.6 pg/mL — ABNORMAL HIGH (ref 2.3–4.2)

## 2018-11-03 ENCOUNTER — Encounter: Payer: Self-pay | Admitting: Internal Medicine

## 2018-11-29 ENCOUNTER — Encounter: Payer: Self-pay | Admitting: Internal Medicine

## 2018-11-29 ENCOUNTER — Other Ambulatory Visit: Payer: Self-pay

## 2018-11-29 ENCOUNTER — Ambulatory Visit (INDEPENDENT_AMBULATORY_CARE_PROVIDER_SITE_OTHER): Payer: BC Managed Care – PPO | Admitting: Internal Medicine

## 2018-11-29 VITALS — BP 100/60 | HR 70 | Ht 62.0 in | Wt 137.0 lb

## 2018-11-29 DIAGNOSIS — E0500 Thyrotoxicosis with diffuse goiter without thyrotoxic crisis or storm: Secondary | ICD-10-CM

## 2018-11-29 DIAGNOSIS — Z23 Encounter for immunization: Secondary | ICD-10-CM

## 2018-11-29 LAB — T4, FREE: Free T4: 0.88 ng/dL (ref 0.60–1.60)

## 2018-11-29 LAB — T3, FREE: T3, Free: 4 pg/mL (ref 2.3–4.2)

## 2018-11-29 LAB — TSH: TSH: <0.01 u[IU]/mL — ABNORMAL LOW (ref 0.35–4.50)

## 2018-11-29 MED ORDER — METHIMAZOLE 5 MG PO TABS: 5.0000 mg | ORAL_TABLET | Freq: Two times a day (BID) | ORAL | 3 refills | Status: DC

## 2018-11-29 NOTE — Patient Instructions (Signed)
Please stop at the lab.  Please continue: - Methimazole 5 mg in am and 2.5 mg in pm  Please come back for a follow-up appointment in 4 months.

## 2018-11-29 NOTE — Progress Notes (Signed)
Patient ID: Sarah Jimenez, female   DOB: 03/18/1960, 58 y.o.   MRN: [id number]   HPI  Sarah Jimenez is a 58 y.o.-year-old female, initially referred by her gastroenterologist, Dr. Tarri Glenn, returning for follow-up for toxicosis with recent diagnosis of Graves' disease. Last visit 3.5 mo ago.  Patient was found to have an undetectable TSH during investigation of diarrhea, which started in 04/2017.  Previous TSH levels from 2017 and 2014 were normal.  Reviewed her TFTs: Lab Results  Component Value Date   TSH <0.01 (L) 10/31/2018   TSH <0.01 (L) 09/15/2018   TSH <0.01 (L) 08/16/2018   TSH <0.01 Repeated and verified X2. (L) 07/28/2018   TSH 2.907 07/04/2012   TSH 1.810 07/03/2011   FREET4 1.06 10/31/2018   FREET4 1.58 09/15/2018   FREET4 1.25 08/16/2018   T3FREE 4.6 (H) 10/31/2018   T3FREE 6.1 (H) 09/15/2018   T3FREE 5.1 (H) 08/16/2018   Her TSI antibodies were not elevated: Lab Results  Component Value Date   TSI 112 08/16/2018   Thyroid uptake and scan (10/13/2018): Uptake at 4 hours 29.4%, uptake in 24 hours, 42.5%, both elevated.  Uniform, consistent with Graves' disease  We started methimazole, initially grams daily, increased to 5 mg in a.m. and 1 mg in p.m. on 06/15/2018.  She is tolerating this well.  Pt denies: - feeling nodules in neck - hoarseness - dysphagia - choking - SOB with lying down  At last visit, she described: Diarrhea, which improved after changing her diet: Cut out meat and dairy Occasional palpitations Fatigue Insomnia They have all resolved now. She also previously had tremors and weight loss, which resolved.  At this visit, she complains of joint pains (right shoulder and hip) and weakness in her legs.  Pt does not have a FH of thyroid ds. No FH of thyroid cancer. No h/o radiation tx to head or neck.  No seaweed or kelp. No recent contrast studies. No herbal supplements. No Biotin use. No recent steroids use.   Of note, a DXA scan was  normal in 2017.  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  ROS: Constitutional: no weight gain/no weight loss, no fatigue, no subjective hyperthermia, no subjective hypothermia Eyes: no blurry vision, no xerophthalmia ENT: no sore throat, + see HPI Cardiovascular: no CP/no SOB/no palpitations/no leg swelling Respiratory: no cough/no SOB/no wheezing Gastrointestinal: no N/no V/no D/no C/no acid reflux Musculoskeletal: no muscle aches/+ joint aches Skin: no rashes, no hair loss Neurological: no tremors/no numbness/no tingling/no dizziness, + weakness  I reviewed pt's medications, allergies, PMH, social hx, family hx, and changes were documented in the history of present illness. Otherwise, unchanged from my initial visit note.  Past Medical History:  Diagnosis Date  . Cancer ([place])    Pre cancer skin  . Heart murmur    Past Surgical History:  Procedure Laterality Date  . DILATATION & CURETTAGE/HYSTEROSCOPY WITH MYOSURE N/A 01/01/2015   Procedure: DILATATION & CURETTAGE/HYSTEROSCOPY WITH MYOSURE;  Surgeon: Terrance Mass, MD;  Location: [facility];  Service: Gynecology;  Laterality: N/A;  . pyloric stenosis  1962   Social History   Socioeconomic History  . Marital status: Single    Spouse name: Not on file  . Number of children: 4  . Years of education: Not on file  . Highest education level: Not on file  Occupational History  .  Communication coordinator  Tobacco Use  . Smoking status: Never Smoker  . Smokeless tobacco: Never Used  Substance and Sexual Activity  . Alcohol use: Yes    Alcohol/week: 2.0 standard drinks    Types: 2 Glasses of wine per week    Comment: LITTLE  . Drug use: Yes    Comment: Edible Marijuana tried while in [place]  . Sexual activity: Not Currently    Birth control/protection: Other-see comments    Comment: vasectomy, INTERCOURSE AGE 58, SEXUAL PARTNERS MORE THAN 5   Current Outpatient Medications on File Prior to Visit  Medication Sig  Dispense Refill  . acyclovir (ZOVIRAX) 200 MG capsule Take 5 times daily for 3-5 days as needed 90 capsule 1  . ALPRAZolam (XANAX) 0.25 MG tablet Take 1 tablet (0.25 mg total) by mouth at bedtime as needed. for sleep 60 tablet 1  . Cholecalciferol (VITAMIN D PO) Take 1 tablet by mouth daily.    Marland Kitchen ibuprofen (ADVIL,MOTRIN) 200 MG tablet Take 400 mg by mouth every 6 (six) hours as needed for moderate pain.    . methimazole (TAPAZOLE) 5 MG tablet Take 1 tablet (5 mg total) by mouth daily. (Patient taking differently: Take 7 mg by mouth daily. ) 45 tablet 3   No current facility-administered medications on file prior to visit.    No Known Allergies Family History  Problem Relation Age of Onset  . Hypertension Father   . Cancer Father 78       pancreatic  . Hypertension Brother   . Heart disease Maternal Grandfather   . Colon cancer Paternal Uncle 21  . Stomach cancer Neg Hx   . Esophageal cancer Neg Hx     PE: BP 100/60   Pulse 70   Ht 5\' 2"  (1.575 m)   Wt 137 lb (62.1 kg)   LMP 10/30/2014   SpO2 97%   BMI 25.06 kg/m  Wt Readings from Last 3 Encounters:  11/29/18 137 lb (62.1 kg)  08/16/18 140 lb (63.5 kg)  08/01/18 141 lb (64 kg)   Constitutional:  Normal weight, in NAD Eyes: PERRLA, EOMI, no exophthalmos ENT: moist mucous membranes, no thyromegaly, no cervical lymphadenopathy Cardiovascular: RRR, No MRG Respiratory: CTA B Gastrointestinal: abdomen soft, NT, ND, BS+ Musculoskeletal: no deformities, strength intact in all 4 Skin: moist, warm, no rashes Neurological: no tremor with outstretched hands, DTR normal in all 4  ASSESSMENT: 1.  Thyrotoxicosis  PLAN:  1. Patient with recent diagnosis of Graves' disease, after being found to be thyrotoxic and symptomatic with hyper defecation/diarrhea, palpitations but without anxiety, heat intolerance or significant weight loss. -At last visit, her TSH remains low, free T4 was normal, and free T3 was elevated.  Her TSI antibodies  were not elevated.  We checked a thyroid uptake and scan and this showed findings consistent with Graves' disease.  We started methimazole 5 mg daily then we increased to 5 mg in a.m. and 2.5 mg in p.m. on 10/31/2018, as her TFTs remained abnormal, only slightly improved.  She continues on this dose now, which she tolerates well. -At this visit, we discussed about the autoimmune nature of her thyroid disease and possible treatments: Continued methimazole, RAI treatment, and, last resort, surgery.  For now, we decided to continue with methimazole.  We discussed that this is safe and effective in preventing Graves' disease recurrences long-term. -Her symptoms have improved on the medication but she describes persistent weakness in her legs and also joint pain.  She wonders whether these are related to the thyroid disease.  We discussed that weakness could be related, but usually not  joint pain.  She also describes that she has the use of her acyclovir more frequently recently, which I do not feel is directly related to her Graves' disease, but I explained that Graves' disease may affect her immune reactivity which can cause herpes outbreaks. -We also discussed about improving her diet and she had many questions about a plant-based diet. -At this visit, we will recheck her TFTs and adjust the methimazole dose accordingly -I will see her back in 4 months but will continue to check her test in the interim  - time spent with the patient: 25 minutes, of which >50% was spent in obtaining information about her symptoms, reviewing her previous labs, evaluations, and treatments, counseling her about her condition (please see the discussed topics above), and developing a plan to further investigate and treat it; she had a number of questions which I addressed.  Component     Latest Ref Rng & Units 11/29/2018  TSH     0.35 - 4.50 uIU/mL <0.01 (L)  T4,Free(Direct)     0.60 - 1.60 ng/dL 0.88   Triiodothyronine,Free,Serum     2.3 - 4.2 pg/mL 4.0   Her thyroid tests have improved with now normal free thyroid hormones.  However, TSH is still undetectable.  Since she is still symptomatic, with occasional diarrhea, we can increase her methimazole to 5 mg twice a day and repeat her test in 1.5 months.  Philemon Kingdom, MD PhD [facility] Endocrinology

## 2018-12-01 ENCOUNTER — Other Ambulatory Visit: Payer: Self-pay | Admitting: Women's Health

## 2018-12-01 DIAGNOSIS — B009 Herpesviral infection, unspecified: Secondary | ICD-10-CM

## 2018-12-02 ENCOUNTER — Ambulatory Visit: Payer: BC Managed Care – PPO | Admitting: Internal Medicine

## 2018-12-26 ENCOUNTER — Other Ambulatory Visit: Payer: Self-pay

## 2018-12-26 DIAGNOSIS — B009 Herpesviral infection, unspecified: Secondary | ICD-10-CM

## 2018-12-28 ENCOUNTER — Other Ambulatory Visit: Payer: Self-pay

## 2018-12-28 DIAGNOSIS — B009 Herpesviral infection, unspecified: Secondary | ICD-10-CM

## 2018-12-28 MED ORDER — ACYCLOVIR 200 MG PO CAPS: ORAL_CAPSULE | ORAL | 4 refills | Status: DC

## 2018-12-28 NOTE — Telephone Encounter (Signed)
Needing refills on Acyclovir.  Wanted you to know that she is taking Methimazole for hyperthyroid. It lowers her immunity and she has been having more HSV onsets. She takes Acyclovir as soon as she feels it and it stops it.  We discussed prophylactic daily medication but she declined that. Just wanted you to know why she is needing more refills.

## 2018-12-28 NOTE — Telephone Encounter (Signed)
Okay, please call in refill for acyclovir

## 2019-01-16 ENCOUNTER — Telehealth: Payer: Self-pay

## 2019-01-16 ENCOUNTER — Other Ambulatory Visit: Payer: Self-pay | Admitting: Internal Medicine

## 2019-01-16 MED ORDER — METHIMAZOLE 5 MG PO TABS: 5.0000 mg | ORAL_TABLET | Freq: Two times a day (BID) | ORAL | 1 refills | Status: DC

## 2019-01-16 NOTE — Telephone Encounter (Signed)
Pt called stating pharmacy is not able to refill Rx due to it being a refill too soon. Believes the wrong qty was sent and therefore resulting in a refill too soon. Asking for Rx to be reviewed with Dr. Cruzita Lederer and re-sent.

## 2019-01-16 NOTE — Addendum Note (Signed)
Addended by: Cardell Peach I on: 01/16/2019 11:14 AM   Modules accepted: Orders

## 2019-01-16 NOTE — Telephone Encounter (Signed)
The request came from the pharmacy. I believe they sent an old one. Will send new one.

## 2019-02-21 DIAGNOSIS — D2239 Melanocytic nevi of other parts of face: Secondary | ICD-10-CM | POA: Diagnosis not present

## 2019-02-21 DIAGNOSIS — Z85828 Personal history of other malignant neoplasm of skin: Secondary | ICD-10-CM | POA: Diagnosis not present

## 2019-02-21 DIAGNOSIS — D225 Melanocytic nevi of trunk: Secondary | ICD-10-CM | POA: Diagnosis not present

## 2019-02-21 DIAGNOSIS — D2271 Melanocytic nevi of right lower limb, including hip: Secondary | ICD-10-CM | POA: Diagnosis not present

## 2019-02-21 DIAGNOSIS — L570 Actinic keratosis: Secondary | ICD-10-CM | POA: Diagnosis not present

## 2019-03-23 ENCOUNTER — Other Ambulatory Visit: Payer: Self-pay

## 2019-03-27 ENCOUNTER — Ambulatory Visit (INDEPENDENT_AMBULATORY_CARE_PROVIDER_SITE_OTHER): Payer: BC Managed Care – PPO | Admitting: Internal Medicine

## 2019-03-27 ENCOUNTER — Other Ambulatory Visit: Payer: Self-pay

## 2019-03-27 ENCOUNTER — Encounter: Payer: Self-pay | Admitting: Internal Medicine

## 2019-03-27 VITALS — BP 118/80 | HR 56 | Ht 62.0 in | Wt 150.0 lb

## 2019-03-27 DIAGNOSIS — E0500 Thyrotoxicosis with diffuse goiter without thyrotoxic crisis or storm: Secondary | ICD-10-CM | POA: Diagnosis not present

## 2019-03-27 NOTE — Patient Instructions (Signed)
Please stop at the lab.  Please continue: - Methimazole 5 mg in am and 5 mg in pm  Please come back for a follow-up appointment in 6 months.

## 2019-03-27 NOTE — Progress Notes (Signed)
Patient ID: Sarah Jimenez, female   DOB: Aug 11, 1960, 59 y.o.   MRN: [id number]   This visit occurred during the SARS-CoV-2 public health emergency.  Safety protocols were in place, including screening questions prior to the visit, additional usage of staff PPE, and extensive cleaning of exam room while observing appropriate contact time as indicated for disinfecting solutions.   HPI  Sarah Jimenez is a 59 y.o.-year-old female, initially referred by her gastroenterologist, Dr. Tarri Glenn, returning for follow-up for Graves' disease. Last visit 4 months ago.  She continues on plant-based diet.  She feels much better on this.  She also eliminated alcohol since she started methimazole.  Reviewed history: Patient was found to have an undetectable TSH during investigation of diarrhea, which started in 04/2017.  Previous TSH levels from 2017 and 2014 were normal.  Reviewed her TFTs: Lab Results  Component Value Date   TSH <0.01 (L) 11/29/2018   TSH <0.01 (L) 10/31/2018   TSH <0.01 (L) 09/15/2018   TSH <0.01 (L) 08/16/2018   TSH <0.01 Repeated and verified X2. (L) 07/28/2018   TSH 2.907 07/04/2012   TSH 1.810 07/03/2011   FREET4 0.88 11/29/2018   FREET4 1.06 10/31/2018   FREET4 1.58 09/15/2018   FREET4 1.25 08/16/2018   T3FREE 4.0 11/29/2018   T3FREE 4.6 (H) 10/31/2018   T3FREE 6.1 (H) 09/15/2018   T3FREE 5.1 (H) 08/16/2018   Her TSI antibodies were not elevated: Lab Results  Component Value Date   TSI 112 08/16/2018   Thyroid uptake and scan (10/13/2018): Uptake at 4 hours 29.4%, uptake in 24 hours, 42.5%, both elevated.  Uniform scan, consistent with Graves' disease  We started methimazole, initially 5 mg daily, increased to 5 mg in a.m. and 5 mg in p.m. on 06/15/2018.  We then decrease to 5 mg in a.m. and 2.5 mg in p.m. (in 10/2018), however, she is now on methimazole twice a day (since 11/2018).  Pt denies: - feeling nodules in neck - hoarseness - dysphagia - choking - SOB  with lying down  She had the following symptoms: Diarrhea, which improved after changing her diet and cutting out meat and dairy.   Occasional palpitations Fatigue Insomnia They have all resolved.  She previously had tremors and weight both which resolved. Also, joint pains (right shoulder and hip) and weakness in her legs are better.  Pt does not have a FH of thyroid ds. No FH of thyroid cancer. No h/o radiation tx to head or neck.  No seaweed or kelp. No recent contrast studies. No herbal supplements. No Biotin use. No recent steroids use.   Of note, a DXA scan was normal in 2017.  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  ROS: Constitutional: + weight gain/no weight loss, no fatigue, no subjective hyperthermia, no subjective hypothermia Eyes: no blurry vision, no xerophthalmia ENT: no sore throat, + see HPI Cardiovascular: no CP/no SOB/no palpitations/no leg swelling Respiratory: no cough/no SOB/no wheezing Gastrointestinal: no N/no V/no D/no C/no acid reflux Musculoskeletal: no muscle aches/+ joint aches Skin: no rashes, no hair loss Neurological: no tremors/no numbness/no tingling/no dizziness  I reviewed pt's medications, allergies, PMH, social hx, family hx, and changes were documented in the history of present illness. Otherwise, unchanged from my initial visit note.  Past Medical History:  Diagnosis Date  . Cancer ([place])    Pre cancer skin  . Heart murmur    Past Surgical History:  Procedure Laterality Date  . DILATATION & CURETTAGE/HYSTEROSCOPY WITH  MYOSURE N/A 01/01/2015   Procedure: DILATATION & CURETTAGE/HYSTEROSCOPY WITH MYOSURE;  Surgeon: Terrance Mass, MD;  Location: [facility];  Service: Gynecology;  Laterality: N/A;  . pyloric stenosis  1962   Social History   Socioeconomic History  . Marital status: Single    Spouse name: Not on file  . Number of children: 4  . Years of education: Not on file  . Highest education level: Not on file   Occupational History  .  Communication coordinator  Tobacco Use  . Smoking status: Never Smoker  . Smokeless tobacco: Never Used  Substance and Sexual Activity  . Alcohol use: Yes    Alcohol/week: 2.0 standard drinks    Types: 2 Glasses of wine per week    Comment: LITTLE  . Drug use: Yes    Comment: Edible Marijuana tried while in [place]  . Sexual activity: Not Currently    Birth control/protection: Other-see comments    Comment: vasectomy, INTERCOURSE AGE 59, SEXUAL PARTNERS MORE THAN 5   Current Outpatient Medications on File Prior to Visit  Medication Sig Dispense Refill  . acyclovir (ZOVIRAX) 200 MG capsule Take 5 times daily for 3 to 5 days as needed 90 capsule 4  . ALPRAZolam (XANAX) 0.25 MG tablet Take 1 tablet (0.25 mg total) by mouth at bedtime as needed. for sleep 60 tablet 1  . Cholecalciferol (VITAMIN D PO) Take 1 tablet by mouth daily.    Marland Kitchen ibuprofen (ADVIL,MOTRIN) 200 MG tablet Take 400 mg by mouth every 6 (six) hours as needed for moderate pain.    . methimazole (TAPAZOLE) 5 MG tablet Take 1 tablet (5 mg total) by mouth 2 (two) times daily. 180 tablet 1   No current facility-administered medications on file prior to visit.   No Known Allergies Family History  Problem Relation Age of Onset  . Hypertension Father   . Cancer Father 36       pancreatic  . Hypertension Brother   . Heart disease Maternal Grandfather   . Colon cancer Paternal Uncle 70  . Stomach cancer Neg Hx   . Esophageal cancer Neg Hx     PE: BP 118/80   Pulse (!) 56   Ht 5\' 2"  (1.575 m)   Wt 150 lb (68 kg)   LMP 10/30/2014   SpO2 98%   BMI 27.44 kg/m  Wt Readings from Last 3 Encounters:  03/27/19 150 lb (68 kg)  11/29/18 137 lb (62.1 kg)  08/16/18 140 lb (63.5 kg)   Constitutional: Normal weight, in NAD Eyes: PERRLA, EOMI, no exophthalmos ENT: moist mucous membranes, no thyromegaly, no cervical lymphadenopathy Cardiovascular: RRR, No MRG Respiratory: CTA B Gastrointestinal:  abdomen soft, NT, ND, BS+ Musculoskeletal: no deformities, strength intact in all 4 Skin: moist, warm, no rashes Neurological: no tremor with outstretched hands, DTR normal in all 4  ASSESSMENT: 1.  Graves' disease  PLAN:  1. Patient with diagnosis of Graves' disease after being found to be thyrotoxic and symptomatic with hyper defecation/diarrhea, palpitations, without anxiety, heat intolerance or significant weight loss. -We started methimazole and continued to adjust the dose, the latest dose change was in 11/2018, increase to 5 mg twice a day.  She tolerates the dose well -We discussed about definitive modalities of treatment to include RAI treatment and surgery, but for now decided to continue methimazole -Her symptoms did improve on methimazole but at last visit  she described persistent weakness in her legs and joint pain.  We discussed the last visit  of these are not usual complaints with thyroid disease.  These are indeed resolved at this visit.  She complains of weight gain now, 30 pounds.  However, she has heavy boots on today.  We discussed about continuing the plant-based diet.  Her weight will most likely stabilized after her TFTs normalized and we can taper off methimazole -At this visit we will repeat her TFTs and change the methimazole dose accordingly -I will see her back in 6 months.  Component     Latest Ref Rng & Units 03/27/2019  TSH     0.35 - 4.50 uIU/mL 16.40 (H)  T4,Free(Direct)     0.60 - 1.60 ng/dL 0.51 (L)  Triiodothyronine,Free,Serum     2.3 - 4.2 pg/mL 3.4   TSH increased significantly and the free T4 is now low.  We will need to back off the methimazole from 5 mg twice daily to 5 mg daily and recheck the test in 4 weeks.  I am hoping that we can reduce the dose further afterwards.  Philemon Kingdom, MD PhD [facility]

## 2019-03-28 LAB — T4, FREE: Free T4: 0.51 ng/dL — ABNORMAL LOW (ref 0.60–1.60)

## 2019-03-28 LAB — TSH: TSH: 16.4 u[IU]/mL — ABNORMAL HIGH (ref 0.35–4.50)

## 2019-03-28 LAB — T3, FREE: T3, Free: 3.4 pg/mL (ref 2.3–4.2)

## 2019-03-29 DIAGNOSIS — E0500 Thyrotoxicosis with diffuse goiter without thyrotoxic crisis or storm: Secondary | ICD-10-CM | POA: Insufficient documentation

## 2019-03-29 MED ORDER — METHIMAZOLE 5 MG PO TABS: 5.0000 mg | ORAL_TABLET | Freq: Every day | ORAL | 1 refills | Status: DC

## 2019-04-24 ENCOUNTER — Other Ambulatory Visit: Payer: BC Managed Care – PPO

## 2019-05-03 ENCOUNTER — Other Ambulatory Visit (INDEPENDENT_AMBULATORY_CARE_PROVIDER_SITE_OTHER): Payer: BC Managed Care – PPO

## 2019-05-03 ENCOUNTER — Other Ambulatory Visit: Payer: Self-pay

## 2019-05-03 DIAGNOSIS — E0500 Thyrotoxicosis with diffuse goiter without thyrotoxic crisis or storm: Secondary | ICD-10-CM | POA: Diagnosis not present

## 2019-05-04 ENCOUNTER — Other Ambulatory Visit: Payer: Self-pay | Admitting: Internal Medicine

## 2019-05-04 DIAGNOSIS — E0500 Thyrotoxicosis with diffuse goiter without thyrotoxic crisis or storm: Secondary | ICD-10-CM

## 2019-05-04 LAB — TSH: TSH: 3.08 u[IU]/mL (ref 0.35–4.50)

## 2019-05-04 LAB — T4, FREE: Free T4: 0.81 ng/dL (ref 0.60–1.60)

## 2019-05-04 LAB — T3, FREE: T3, Free: 3.4 pg/mL (ref 2.3–4.2)

## 2019-06-05 ENCOUNTER — Other Ambulatory Visit: Payer: Self-pay | Admitting: Internal Medicine

## 2019-06-05 ENCOUNTER — Other Ambulatory Visit (INDEPENDENT_AMBULATORY_CARE_PROVIDER_SITE_OTHER): Payer: BC Managed Care – PPO

## 2019-06-05 ENCOUNTER — Other Ambulatory Visit: Payer: Self-pay

## 2019-06-05 DIAGNOSIS — E0500 Thyrotoxicosis with diffuse goiter without thyrotoxic crisis or storm: Secondary | ICD-10-CM

## 2019-06-05 LAB — TSH: TSH: 4.36 u[IU]/mL (ref 0.35–4.50)

## 2019-06-05 LAB — T4, FREE: Free T4: 0.69 ng/dL (ref 0.60–1.60)

## 2019-06-05 LAB — T3, FREE: T3, Free: 3.1 pg/mL (ref 2.3–4.2)

## 2019-06-05 MED ORDER — METHIMAZOLE 5 MG PO TABS: 2.5000 mg | ORAL_TABLET | Freq: Every day | ORAL | 1 refills | Status: DC

## 2019-07-12 ENCOUNTER — Encounter: Payer: Self-pay | Admitting: Internal Medicine

## 2019-07-12 ENCOUNTER — Other Ambulatory Visit (INDEPENDENT_AMBULATORY_CARE_PROVIDER_SITE_OTHER): Payer: BC Managed Care – PPO

## 2019-07-12 ENCOUNTER — Other Ambulatory Visit: Payer: Self-pay

## 2019-07-12 DIAGNOSIS — E0500 Thyrotoxicosis with diffuse goiter without thyrotoxic crisis or storm: Secondary | ICD-10-CM | POA: Diagnosis not present

## 2019-07-12 LAB — T3, FREE: T3, Free: 3.8 pg/mL (ref 2.3–4.2)

## 2019-07-12 LAB — TSH: TSH: 3.67 u[IU]/mL (ref 0.35–4.50)

## 2019-07-12 LAB — T4, FREE: Free T4: 0.86 ng/dL (ref 0.60–1.60)

## 2019-07-18 ENCOUNTER — Other Ambulatory Visit: Payer: Self-pay | Admitting: Nurse Practitioner

## 2019-07-18 DIAGNOSIS — Z1231 Encounter for screening mammogram for malignant neoplasm of breast: Secondary | ICD-10-CM

## 2019-08-09 ENCOUNTER — Encounter: Payer: Self-pay | Admitting: Nurse Practitioner

## 2019-08-09 ENCOUNTER — Other Ambulatory Visit: Payer: Self-pay

## 2019-08-09 ENCOUNTER — Ambulatory Visit (INDEPENDENT_AMBULATORY_CARE_PROVIDER_SITE_OTHER): Payer: BC Managed Care – PPO | Admitting: Nurse Practitioner

## 2019-08-09 VITALS — BP 114/76 | Ht 63.0 in | Wt 145.8 lb

## 2019-08-09 DIAGNOSIS — F4322 Adjustment disorder with anxiety: Secondary | ICD-10-CM

## 2019-08-09 DIAGNOSIS — B009 Herpesviral infection, unspecified: Secondary | ICD-10-CM

## 2019-08-09 DIAGNOSIS — Z01419 Encounter for gynecological examination (general) (routine) without abnormal findings: Secondary | ICD-10-CM

## 2019-08-09 DIAGNOSIS — Z131 Encounter for screening for diabetes mellitus: Secondary | ICD-10-CM | POA: Diagnosis not present

## 2019-08-09 MED ORDER — ALPRAZOLAM 0.25 MG PO TABS: 0.2500 mg | ORAL_TABLET | Freq: Every evening | ORAL | 0 refills | Status: DC | PRN

## 2019-08-09 MED ORDER — ACYCLOVIR 200 MG PO CAPS: ORAL_CAPSULE | ORAL | 4 refills | Status: DC

## 2019-08-09 NOTE — Progress Notes (Signed)
   Sarah Jimenez 08-06-1960 [id number]   History:  59 y.o. G4 P4 presents for annual exam without GYN complaints. Postmenopausal - no HRT, no bleeding. 2017 endometrial polyp removed. Normal pap and mammogram history. HSV, more frequent outbreaks since beginning hyperthyroid treatment, on Acyclovir. Xanax occasionally for insomnia and anxiety. History of Graves disease, followed by endocrinology.   Gynecologic History Patient's last menstrual period was 10/30/2014.   Contraception: post menopausal status Last Pap: 07/19/2016. Results were: normal Last mammogram: 08/12/2018. Results were: normal Last colonoscopy: 05/08/2011. Results were: normal Last Dexa: 08/15/2015. Results were: normal  Past medical history, past surgical history, family history and social history were all reviewed and documented in the EPIC chart.  ROS:  A ROS was performed and pertinent positives and negatives are included.  Exam:  Vitals:   08/09/19 1135  BP: 114/76  Weight: 145 lb 12.8 oz (66.1 kg)  Height: 5\' 3"  (1.6 m)   Body mass index is 25.83 kg/m.  General appearance:  Normal Thyroid:  Symmetrical, normal in size, without palpable masses or nodularity. Respiratory  Auscultation:  Clear without wheezing or rhonchi Cardiovascular  Auscultation:  Regular rate, without rubs, murmurs or gallops  Edema/varicosities:  Not grossly evident Abdominal  Soft,nontender, without masses, guarding or rebound.  Liver/spleen:  No organomegaly noted  Hernia:  None appreciated  Skin  Inspection:  Grossly normal   Breasts: Examined lying and sitting.   Right: Without masses, retractions, discharge or axillary adenopathy.   Left: Without masses, retractions, discharge or axillary adenopathy. Gentitourinary   Inguinal/mons:  Normal without inguinal adenopathy  External genitalia:  Normal  BUS/Urethra/Skene's glands:  Normal  Vagina:  Normal  Cervix:  Normal  Uterus:  Anteverted, normal in size, shape and  contour.  Midline and mobile  Adnexa/parametria:     Rt: Without masses or tenderness.   Lt: Without masses or tenderness.  Anus and perineum: Normal  Digital rectal exam: Normal sphincter tone without palpated masses or tenderness  Assessment/Plan:  59 y.o. G4 P4 for annual exam.    Well female exam with routine gynecological exam - Plan: CBC with Differential/Platelet, Comprehensive metabolic panel, Hemoglobin A1c. Education provided on SBEs, importance of preventative screenings, current guidelines, high calcium diet, regular exercise, and multivitamin daily.   Adjustment disorder with anxious mood - Plan: ALPRAZolam (XANAX) 0.25 MG tablet. Aware of addictive properties and takes sparingly.   HSV infection - Plan: acyclovir (ZOVIRAX) 200 MG capsule  Screening for osteoporosis - last DEXA 2017 was normal. We will plan to repeat next year at 5 year interval.   Follow up in 1 year for annual  [place], 11:38 AM 08/09/2019

## 2019-08-09 NOTE — Patient Instructions (Signed)
Health Maintenance, Female Adopting a healthy lifestyle and getting preventive care are important in promoting health and wellness. Ask your health care provider about:  The right schedule for you to have regular tests and exams.  Things you can do on your own to prevent diseases and keep yourself healthy. What should I know about diet, weight, and exercise? Eat a healthy diet   Eat a diet that includes plenty of vegetables, fruits, low-fat dairy products, and lean protein.  Do not eat a lot of foods that are high in solid fats, added sugars, or sodium. Maintain a healthy weight Body mass index (BMI) is used to identify weight problems. It estimates body fat based on height and weight. Your health care provider can help determine your BMI and help you achieve or maintain a healthy weight. Get regular exercise Get regular exercise. This is one of the most important things you can do for your health. Most adults should:  Exercise for at least 150 minutes each week. The exercise should increase your heart rate and make you sweat (moderate-intensity exercise).  Do strengthening exercises at least twice a week. This is in addition to the moderate-intensity exercise.  Spend less time sitting. Even light physical activity can be beneficial. Watch cholesterol and blood lipids Have your blood tested for lipids and cholesterol at 59 years of age, then have this test every 5 years. Have your cholesterol levels checked more often if:  Your lipid or cholesterol levels are high.  You are older than 59 years of age.  You are at high risk for heart disease. What should I know about cancer screening? Depending on your health history and family history, you may need to have cancer screening at various ages. This may include screening for:  Breast cancer.  Cervical cancer.  Colorectal cancer.  Skin cancer.  Lung cancer. What should I know about heart disease, diabetes, and high blood  pressure? Blood pressure and heart disease  High blood pressure causes heart disease and increases the risk of stroke. This is more likely to develop in people who have high blood pressure readings, are of African descent, or are overweight.  Have your blood pressure checked: ? Every 3-5 years if you are 18-39 years of age. ? Every year if you are 40 years old or older. Diabetes Have regular diabetes screenings. This checks your fasting blood sugar level. Have the screening done:  Once every three years after age 40 if you are at a normal weight and have a low risk for diabetes.  More often and at a younger age if you are overweight or have a high risk for diabetes. What should I know about preventing infection? Hepatitis B If you have a higher risk for hepatitis B, you should be screened for this virus. Talk with your health care provider to find out if you are at risk for hepatitis B infection. Hepatitis C Testing is recommended for:  Everyone born from 1945 through 1965.  Anyone with known risk factors for hepatitis C. Sexually transmitted infections (STIs)  Get screened for STIs, including gonorrhea and chlamydia, if: ? You are sexually active and are younger than 59 years of age. ? You are older than 59 years of age and your health care provider tells you that you are at risk for this type of infection. ? Your sexual activity has changed since you were last screened, and you are at increased risk for chlamydia or gonorrhea. Ask your health care provider if   you are at risk.  Ask your health care provider about whether you are at high risk for HIV. Your health care provider may recommend a prescription medicine to help prevent HIV infection. If you choose to take medicine to prevent HIV, you should first get tested for HIV. You should then be tested every 3 months for as long as you are taking the medicine. Pregnancy  If you are about to stop having your period (premenopausal) and  you may become pregnant, seek counseling before you get pregnant.  Take 400 to 800 micrograms (mcg) of folic acid every day if you become pregnant.  Ask for birth control (contraception) if you want to prevent pregnancy. Osteoporosis and menopause Osteoporosis is a disease in which the bones lose minerals and strength with aging. This can result in bone fractures. If you are 65 years old or older, or if you are at risk for osteoporosis and fractures, ask your health care provider if you should:  Be screened for bone loss.  Take a calcium or vitamin D supplement to lower your risk of fractures.  Be given hormone replacement therapy (HRT) to treat symptoms of menopause. Follow these instructions at home: Lifestyle  Do not use any products that contain nicotine or tobacco, such as cigarettes, e-cigarettes, and chewing tobacco. If you need help quitting, ask your health care provider.  Do not use street drugs.  Do not share needles.  Ask your health care provider for help if you need support or information about quitting drugs. Alcohol use  Do not drink alcohol if: ? Your health care provider tells you not to drink. ? You are pregnant, may be pregnant, or are planning to become pregnant.  If you drink alcohol: ? Limit how much you use to 0-1 drink a day. ? Limit intake if you are breastfeeding.  Be aware of how much alcohol is in your drink. In the [place] one drink equals one 12 oz bottle of beer (355 mL), one 5 oz glass of wine (148 mL), or one 1 oz glass of hard liquor (44 mL). General instructions  Schedule regular health, dental, and eye exams.  Stay current with your vaccines.  Tell your health care provider if: ? You often feel depressed. ? You have ever been abused or do not feel safe at home. Summary  Adopting a healthy lifestyle and getting preventive care are important in promoting health and wellness.  Follow your health care provider's instructions about healthy  diet, exercising, and getting tested or screened for diseases.  Follow your health care provider's instructions on monitoring your cholesterol and blood pressure. This information is not intended to replace advice given to you by your health care provider. Make sure you discuss any questions you have with your health care provider. Document Revised: 01/05/2018 Document Reviewed: 01/05/2018 Elsevier Patient Education  2020 [employer]  

## 2019-08-10 LAB — CBC WITH DIFFERENTIAL/PLATELET
Absolute Monocytes: 454 cells/uL (ref 200–950)
Basophils Absolute: 81 cells/uL (ref 0–200)
Basophils Relative: 1.5 %
Eosinophils Absolute: 92 cells/uL (ref 15–500)
Eosinophils Relative: 1.7 %
HCT: 41.7 % (ref 35.0–45.0)
Hemoglobin: 14.1 g/dL (ref 11.7–15.5)
Lymphs Abs: 1415 cells/uL (ref 850–3900)
MCH: 32.6 pg (ref 27.0–33.0)
MCHC: 33.8 g/dL (ref 32.0–36.0)
MCV: 96.5 fL (ref 80.0–100.0)
MPV: 10.4 fL (ref 7.5–12.5)
Monocytes Relative: 8.4 %
Neutro Abs: 3359 cells/uL (ref 1500–7800)
Neutrophils Relative %: 62.2 %
Platelets: 363 10*3/uL (ref 140–400)
RBC: 4.32 10*6/uL (ref 3.80–5.10)
RDW: 12.4 % (ref 11.0–15.0)
Total Lymphocyte: 26.2 %
WBC: 5.4 10*3/uL (ref 3.8–10.8)

## 2019-08-10 LAB — COMPREHENSIVE METABOLIC PANEL
AG Ratio: 1.6 (calc) (ref 1.0–2.5)
ALT: 14 U/L (ref 6–29)
AST: 17 U/L (ref 10–35)
Albumin: 4.4 g/dL (ref 3.6–5.1)
Alkaline phosphatase (APISO): 75 U/L (ref 37–153)
BUN: 11 mg/dL (ref 7–25)
CO2: 27 mmol/L (ref 20–32)
Calcium: 9.1 mg/dL (ref 8.6–10.4)
Chloride: 101 mmol/L (ref 98–110)
Creat: 0.86 mg/dL (ref 0.50–1.05)
Globulin: 2.7 g/dL (calc) (ref 1.9–3.7)
Glucose, Bld: 82 mg/dL (ref 65–99)
Potassium: 4.2 mmol/L (ref 3.5–5.3)
Sodium: 137 mmol/L (ref 135–146)
Total Bilirubin: 0.6 mg/dL (ref 0.2–1.2)
Total Protein: 7.1 g/dL (ref 6.1–8.1)

## 2019-08-10 LAB — HEMOGLOBIN A1C
Hgb A1c MFr Bld: 5.5 % of total Hgb (ref <5.7)
Mean Plasma Glucose: 111 (calc)
eAG (mmol/L): 6.2 (calc)

## 2019-08-20 ENCOUNTER — Other Ambulatory Visit: Payer: Self-pay | Admitting: Internal Medicine

## 2019-08-25 ENCOUNTER — Encounter: Payer: Self-pay | Admitting: Internal Medicine

## 2019-09-11 ENCOUNTER — Telehealth: Payer: Self-pay | Admitting: *Deleted

## 2019-09-11 DIAGNOSIS — F4322 Adjustment disorder with anxiety: Secondary | ICD-10-CM

## 2019-09-11 MED ORDER — ALPRAZOLAM 0.25 MG PO TABS: 0.2500 mg | ORAL_TABLET | Freq: Every evening | ORAL | 0 refills | Status: DC | PRN

## 2019-09-11 NOTE — Telephone Encounter (Signed)
Patient was seen on 08/09/19 for annual exam, report pharmacy never received Xanax 0.25 mg capsule. It appears Rx was set on phone in and not phoned in by anyone. Rx called in today, patient aware.

## 2019-09-28 ENCOUNTER — Ambulatory Visit: Payer: BC Managed Care – PPO | Admitting: Internal Medicine

## 2019-11-07 ENCOUNTER — Encounter: Payer: Self-pay | Admitting: Internal Medicine

## 2019-11-08 ENCOUNTER — Ambulatory Visit (INDEPENDENT_AMBULATORY_CARE_PROVIDER_SITE_OTHER): Payer: BC Managed Care – PPO | Admitting: Internal Medicine

## 2019-11-08 ENCOUNTER — Encounter: Payer: Self-pay | Admitting: Internal Medicine

## 2019-11-08 ENCOUNTER — Other Ambulatory Visit: Payer: Self-pay

## 2019-11-08 VITALS — BP 108/68 | HR 94 | Ht 63.0 in | Wt 141.6 lb

## 2019-11-08 DIAGNOSIS — E0500 Thyrotoxicosis with diffuse goiter without thyrotoxic crisis or storm: Secondary | ICD-10-CM | POA: Diagnosis not present

## 2019-11-08 LAB — T4, FREE: Free T4: 1.86 ng/dL — ABNORMAL HIGH (ref 0.60–1.60)

## 2019-11-08 LAB — TSH: TSH: <0.01 u[IU]/mL — ABNORMAL LOW (ref 0.35–4.50)

## 2019-11-08 LAB — T3, FREE: T3, Free: 7.1 pg/mL — ABNORMAL HIGH (ref 2.3–4.2)

## 2019-11-08 NOTE — Progress Notes (Signed)
Patient ID: Sarah Jimenez, female   DOB: Apr 09, 1960, 59 y.o.   MRN: [id number]   This visit occurred during the SARS-CoV-2 public health emergency.  Safety protocols were in place, including screening questions prior to the visit, additional usage of staff PPE, and extensive cleaning of exam room while observing appropriate contact time as indicated for disinfecting solutions.   HPI  Sarah Jimenez is a 59 y.o.-year-old female, initially referred by her gastroenterologist, Dr. Tarri Glenn, returning for follow-up for Graves' disease. Last visit 4 months ago.  She continues on a plant-based diet, feeling much better on this.  However, she occasionally eats meat/cheese and she feels worse afterwards, with weakness and joint pains.   Reviewed history: Patient was found to have an undetectable TSH during investigation of diarrhea, which started in 04/2017.  Previous TSH levels from 2017 and 2014 were normal.  Reviewed her TFTs: Lab Results  Component Value Date   TSH 3.67 07/12/2019   TSH 4.36 06/05/2019   TSH 3.08 05/03/2019   TSH 16.40 (H) 03/27/2019   TSH <0.01 (L) 11/29/2018   TSH <0.01 (L) 10/31/2018   TSH <0.01 (L) 09/15/2018   TSH <0.01 (L) 08/16/2018   TSH <0.01 Repeated and verified X2. (L) 07/28/2018   TSH 2.907 07/04/2012   FREET4 0.86 07/12/2019   FREET4 0.69 06/05/2019   FREET4 0.81 05/03/2019   FREET4 0.51 (L) 03/27/2019   FREET4 0.88 11/29/2018   FREET4 1.06 10/31/2018   FREET4 1.58 09/15/2018   FREET4 1.25 08/16/2018   T3FREE 3.8 07/12/2019   T3FREE 3.1 06/05/2019   T3FREE 3.4 05/03/2019   T3FREE 3.4 03/27/2019   T3FREE 4.0 11/29/2018   T3FREE 4.6 (H) 10/31/2018   T3FREE 6.1 (H) 09/15/2018   T3FREE 5.1 (H) 08/16/2018   Her TSI antibodies were not elevated: Lab Results  Component Value Date   TSI 112 08/16/2018   Thyroid uptake and scan (10/13/2018): Uptake at 4 hours 29.4%, uptake in 24 hours, 42.5%, both elevated.  Uniform scan, consistent with Graves'  disease.  08/2018: We started methimazole (MMI), initially 5 mg daily. 05/2018: MMI increase to 5 mg 2x a day 10/2018: MMI decrease to 5 mg in a.m. and 2.5 mg in p.m. 11/2018: She increased MMI dose to 5 mg 2x a day 03/2019: MMI decreased to 5 mg daily 05/2019: MMI decreased to 2.5 mg daily 08/2019: she stopped MMI by herself  Pt denies: - feeling nodules in neck - hoarseness - dysphagia - choking - SOB with lying down  She initially had the following symptoms: Diarrhea, which improved after changing her diet and cutting out meat and dairy.   Occasional palpitations Fatigue Insomnia These have all resolved.  She also had tremors in the past, which resolved.  No FH of thyroid disease or thyroid cancer. No h/o radiation tx to head or neck.  No Biotin use. No recent steroids use. + Added CBD oil at night which helps her sleep.  Of note, a DXA scan was normal in 2017.  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  She also has a history of frozen shoulders.  ROS: Constitutional: no weight gain/+ weight loss, no fatigue, no subjective hyperthermia, no subjective hypothermia Eyes: no blurry vision, no xerophthalmia ENT: no sore throat, + see HPI Cardiovascular: no CP/no SOB/no palpitations/no leg swelling Respiratory: no cough/no SOB/no wheezing Gastrointestinal: no N/no V/+ D/no C/no acid reflux Musculoskeletal: no muscle aches/+ joint aches Skin: no rashes, no hair loss Neurological: no tremors/no numbness/no  tingling/no dizziness  I reviewed pt's medications, allergies, PMH, social hx, family hx, and changes were documented in the history of present illness. Otherwise, unchanged from my initial visit note.  Past Medical History:  Diagnosis Date  . Cancer ([place])    Pre cancer skin  . Graves disease 09/2018  . Heart murmur    Past Surgical History:  Procedure Laterality Date  . DILATATION & CURETTAGE/HYSTEROSCOPY WITH MYOSURE N/A 01/01/2015   Procedure: DILATATION  & CURETTAGE/HYSTEROSCOPY WITH MYOSURE;  Surgeon: Terrance Mass, MD;  Location: [facility];  Service: Gynecology;  Laterality: N/A;  . pyloric stenosis  1962   Social History   Socioeconomic History  . Marital status: Single    Spouse name: Not on file  . Number of children: 4  . Years of education: Not on file  . Highest education level: Not on file  Occupational History  .  Communication coordinator  Tobacco Use  . Smoking status: Never Smoker  . Smokeless tobacco: Never Used  Substance and Sexual Activity  . Alcohol use: Yes    Alcohol/week: 2.0 standard drinks    Types: 2 Glasses of wine per week    Comment: LITTLE  . Drug use: Yes    Comment: Edible Marijuana tried while in [place]  . Sexual activity: Not Currently    Birth control/protection: Other-see comments    Comment: vasectomy, INTERCOURSE AGE 57, SEXUAL PARTNERS MORE THAN 5   Current Outpatient Medications on File Prior to Visit  Medication Sig Dispense Refill  . acyclovir (ZOVIRAX) 200 MG capsule Take 5 times daily for 3 to 5 days as needed 90 capsule 4  . ALPRAZolam (XANAX) 0.25 MG tablet Take 1 tablet (0.25 mg total) by mouth at bedtime as needed. for sleep 30 tablet 0  . ibuprofen (ADVIL,MOTRIN) 200 MG tablet Take 400 mg by mouth every 6 (six) hours as needed for moderate pain.    . methimazole (TAPAZOLE) 5 MG tablet TAKE 1 TABLET BY MOUTH TWICE A DAY 180 tablet 1   No current facility-administered medications on file prior to visit.   No Known Allergies Family History  Problem Relation Age of Onset  . Hypertension Father   . Cancer Father 44       pancreatic  . Hypertension Brother   . Heart disease Maternal Grandfather   . Colon cancer Paternal Uncle 46  . Stomach cancer Neg Hx   . Esophageal cancer Neg Hx     PE: BP 108/68   Pulse 94   Ht 5\' 3"  (1.6 m)   Wt 141 lb 9.6 oz (64.2 kg)   LMP 10/30/2014   SpO2 97%   BMI 25.08 kg/m  Wt Readings from Last 3 Encounters:  11/08/19 141 lb 9.6 oz  (64.2 kg)  08/09/19 145 lb 12.8 oz (66.1 kg)  03/27/19 150 lb (68 kg)   Constitutional: normal weight, in NAD Eyes: PERRLA, EOMI, no exophthalmos ENT: moist mucous membranes, no thyromegaly, no cervical lymphadenopathy Cardiovascular: tachycardia, RR, No MRG Respiratory: CTA B Gastrointestinal: abdomen soft, NT, ND, BS+ Musculoskeletal: no deformities, strength intact in all 4 Skin: moist, warm, no rashes Neurological: no tremor with outstretched hands, DTR normal in all 4  ASSESSMENT: 1.  Graves' disease  PLAN:  1. Patient with history of Graves' disease, diagnosed after being found to be thyrotoxic and symptomatic with diarrhea, palpitations, but without anxiety, heat intolerance, or significant weight loss. She complained of weight gain at last visit, of 30 pounds.  Since last visit,  she was able to lose 9 pounds, and continues on a mostly plant-based diet.  She did have some meat and cheese, which she is trying to eliminate. -We started methimazole after diagnosis and continued to adjust the dose.  In 11/2018, the dose was increased to 5 mg twice a day.  In 03/2019, her TSH returned elevated so we decreased it to 5 mg daily.  In 05/2019, we decreased the dose to 2.5 mg daily.  At this visit, she tells me that she stopped methimazole completely 08/2019.  She does not feel differently after stopping methimazole -We did discuss about definitive modalities of treatment for Graves' disease, to include RAI treatment or surgery, but she responded very well to methimazole.  She does agree to restart the low-dose methimazole, if needed. -At this visit, we will recheck her TFTs and see if we need to restart methimazole. -I will see her back in 6 mo  Component     Latest Ref Rng & Units 11/08/2019  TSH     0.35 - 4.50 uIU/mL <0.01 (L)  T4,Free(Direct)     0.60 - 1.60 ng/dL 1.86 (H)  Triiodothyronine,Free,Serum     2.3 - 4.2 pg/mL 7.1 (H)   Was suggested to restart on 5 mg of methimazole  daily and recheck her tests in 4 to 5 weeks.  Philemon Kingdom, MD PhD [facility]

## 2019-11-08 NOTE — Patient Instructions (Signed)
Please stop at the lab.  Please come back for a follow-up appointment in 6 months.  

## 2019-11-09 MED ORDER — METHIMAZOLE 5 MG PO TABS
5.0000 mg | ORAL_TABLET | Freq: Every day | ORAL | 1 refills | Status: DC
Start: 2019-11-09 — End: 2020-09-05

## 2019-11-13 ENCOUNTER — Ambulatory Visit: Payer: BC Managed Care – PPO | Admitting: Gastroenterology

## 2019-12-20 ENCOUNTER — Other Ambulatory Visit: Payer: Self-pay

## 2019-12-20 ENCOUNTER — Other Ambulatory Visit: Payer: Self-pay | Admitting: Internal Medicine

## 2019-12-20 ENCOUNTER — Other Ambulatory Visit (INDEPENDENT_AMBULATORY_CARE_PROVIDER_SITE_OTHER): Payer: BC Managed Care – PPO

## 2019-12-20 DIAGNOSIS — E0500 Thyrotoxicosis with diffuse goiter without thyrotoxic crisis or storm: Secondary | ICD-10-CM | POA: Diagnosis not present

## 2019-12-20 DIAGNOSIS — Z23 Encounter for immunization: Secondary | ICD-10-CM

## 2019-12-20 LAB — TSH: TSH: <0.01 u[IU]/mL — ABNORMAL LOW (ref 0.35–4.50)

## 2019-12-20 LAB — T4, FREE: Free T4: 1.14 ng/dL (ref 0.60–1.60)

## 2019-12-20 LAB — T3, FREE: T3, Free: 4.3 pg/mL — ABNORMAL HIGH (ref 2.3–4.2)

## 2019-12-29 ENCOUNTER — Ambulatory Visit: Payer: BC Managed Care – PPO | Attending: Internal Medicine

## 2019-12-29 DIAGNOSIS — Z23 Encounter for immunization: Secondary | ICD-10-CM

## 2019-12-29 NOTE — Progress Notes (Signed)
° °  Covid-19 Vaccination Clinic  Name:  Sarah Jimenez    MRN: [id number] DOB: 09/22/1960  12/29/2019  [name] was observed post Covid-19 immunization for 15 minutes without incident. She was provided with Vaccine Information Sheet and instruction to access the V-Safe system.   [name] was instructed to call 911 with any severe reactions post vaccine:  Difficulty breathing   Swelling of face and throat   A fast heartbeat   A bad rash all over body   Dizziness and weakness   Immunizations Administered    Name Date Dose VIS Date Route   Pfizer COVID-19 Vaccine 12/29/2019  4:03 PM 0.3 mL 11/15/2019 Intramuscular   Manufacturer: [place]   Lot: [id number]   NDC: [address]-1

## 2020-01-17 DIAGNOSIS — L570 Actinic keratosis: Secondary | ICD-10-CM | POA: Diagnosis not present

## 2020-01-17 DIAGNOSIS — L603 Nail dystrophy: Secondary | ICD-10-CM | POA: Diagnosis not present

## 2020-01-17 DIAGNOSIS — Z85828 Personal history of other malignant neoplasm of skin: Secondary | ICD-10-CM | POA: Diagnosis not present

## 2020-01-17 DIAGNOSIS — L814 Other melanin hyperpigmentation: Secondary | ICD-10-CM | POA: Diagnosis not present

## 2020-01-31 ENCOUNTER — Other Ambulatory Visit: Payer: Self-pay

## 2020-01-31 ENCOUNTER — Other Ambulatory Visit (INDEPENDENT_AMBULATORY_CARE_PROVIDER_SITE_OTHER): Payer: BC Managed Care – PPO

## 2020-01-31 DIAGNOSIS — E0500 Thyrotoxicosis with diffuse goiter without thyrotoxic crisis or storm: Secondary | ICD-10-CM | POA: Diagnosis not present

## 2020-01-31 LAB — TSH: TSH: <0.01 u[IU]/mL — ABNORMAL LOW (ref 0.35–4.50)

## 2020-01-31 LAB — T4, FREE: Free T4: 0.9 ng/dL (ref 0.60–1.60)

## 2020-01-31 LAB — T3, FREE: T3, Free: 3.9 pg/mL (ref 2.3–4.2)

## 2020-02-01 ENCOUNTER — Other Ambulatory Visit: Payer: Self-pay | Admitting: Internal Medicine

## 2020-02-01 DIAGNOSIS — E0500 Thyrotoxicosis with diffuse goiter without thyrotoxic crisis or storm: Secondary | ICD-10-CM

## 2020-02-09 ENCOUNTER — Ambulatory Visit: Payer: BC Managed Care – PPO

## 2020-03-06 ENCOUNTER — Other Ambulatory Visit (INDEPENDENT_AMBULATORY_CARE_PROVIDER_SITE_OTHER): Payer: BC Managed Care – PPO

## 2020-03-06 ENCOUNTER — Other Ambulatory Visit: Payer: Self-pay

## 2020-03-06 ENCOUNTER — Other Ambulatory Visit: Payer: Self-pay | Admitting: Internal Medicine

## 2020-03-06 ENCOUNTER — Ambulatory Visit
Admission: RE | Admit: 2020-03-06 | Discharge: 2020-03-06 | Disposition: A | Discharge status: Home / Self Care | Payer: BC Managed Care – PPO | Source: Ambulatory Visit | Attending: Nurse Practitioner | Admitting: Nurse Practitioner

## 2020-03-06 DIAGNOSIS — Z1231 Encounter for screening mammogram for malignant neoplasm of breast: Secondary | ICD-10-CM | POA: Diagnosis not present

## 2020-03-06 DIAGNOSIS — E0500 Thyrotoxicosis with diffuse goiter without thyrotoxic crisis or storm: Secondary | ICD-10-CM | POA: Diagnosis not present

## 2020-03-06 LAB — T4, FREE: Free T4: 0.83 ng/dL (ref 0.60–1.60)

## 2020-03-06 LAB — TSH: TSH: 0.01 u[IU]/mL — ABNORMAL LOW (ref 0.35–4.50)

## 2020-03-06 LAB — T3, FREE: T3, Free: 3.7 pg/mL (ref 2.3–4.2)

## 2020-05-08 ENCOUNTER — Encounter: Payer: Self-pay | Admitting: Internal Medicine

## 2020-05-09 ENCOUNTER — Ambulatory Visit: Payer: BC Managed Care – PPO | Admitting: Internal Medicine

## 2020-05-12 ENCOUNTER — Other Ambulatory Visit: Payer: Self-pay | Admitting: Nurse Practitioner

## 2020-05-12 DIAGNOSIS — F4322 Adjustment disorder with anxiety: Secondary | ICD-10-CM

## 2020-05-13 NOTE — Telephone Encounter (Signed)
Patient was last annual was  08/09/2019 with Marny Lowenstein NP.Patient requesting Xanax Refill. Will send request to Provider for approval/denial.

## 2020-05-31 ENCOUNTER — Ambulatory Visit: Payer: BC Managed Care – PPO | Admitting: Internal Medicine

## 2020-07-17 ENCOUNTER — Encounter: Payer: Self-pay | Admitting: Internal Medicine

## 2020-07-17 ENCOUNTER — Other Ambulatory Visit: Payer: Self-pay

## 2020-07-17 ENCOUNTER — Ambulatory Visit (INDEPENDENT_AMBULATORY_CARE_PROVIDER_SITE_OTHER): Payer: BC Managed Care – PPO | Admitting: Internal Medicine

## 2020-07-17 VITALS — BP 120/80 | HR 71 | Ht 63.0 in | Wt 140.8 lb

## 2020-07-17 DIAGNOSIS — E0500 Thyrotoxicosis with diffuse goiter without thyrotoxic crisis or storm: Secondary | ICD-10-CM

## 2020-07-17 LAB — T3, FREE: T3, Free: 3.5 pg/mL (ref 2.3–4.2)

## 2020-07-17 LAB — T4, FREE: Free T4: 0.66 ng/dL (ref 0.60–1.60)

## 2020-07-17 LAB — TSH: TSH: 1.5 u[IU]/mL (ref 0.35–4.50)

## 2020-07-17 NOTE — Progress Notes (Signed)
Patient ID: Sarah Jimenez, female   DOB: 07-16-1960, 60 y.o.   MRN: [id number]   This visit occurred during the SARS-CoV-2 public health emergency.  Safety protocols were in place, including screening questions prior to the visit, additional usage of staff PPE, and extensive cleaning of exam room while observing appropriate contact time as indicated for disinfecting solutions.   HPI  Sarah Jimenez is a 60 y.o.-year-old female, initially referred by her gastroenterologist, Dr. Tarri Glenn, returning for follow-up for Graves' disease. Last visit 8 months ago.    Interim history: She was on a plant-based diet, feeling much better on this.  However, since last visit, she switched to an omnivorous diet.  She is now exercising more.  Joint pains improved.  Reviewed history: Patient was found to have an undetectable TSH during investigation of diarrhea, which started in 04/2017.  Previous TSH levels from 2017 and 2014 were normal.  Reviewed her TFTs: Lab Results  Component Value Date   TSH 0.01 (L) 03/06/2020   TSH <0.01 Repeated and verified X2. (L) 01/31/2020   TSH <0.01 (L) 12/20/2019   TSH <0.01 (L) 11/08/2019   TSH 3.67 07/12/2019   TSH 4.36 06/05/2019   TSH 3.08 05/03/2019   TSH 16.40 (H) 03/27/2019   TSH <0.01 (L) 11/29/2018   TSH <0.01 (L) 10/31/2018   FREET4 0.83 03/06/2020   FREET4 0.90 01/31/2020   FREET4 1.14 12/20/2019   FREET4 1.86 (H) 11/08/2019   FREET4 0.86 07/12/2019   FREET4 0.69 06/05/2019   FREET4 0.81 05/03/2019   FREET4 0.51 (L) 03/27/2019   FREET4 0.88 11/29/2018   FREET4 1.06 10/31/2018   T3FREE 3.7 03/06/2020   T3FREE 3.9 01/31/2020   T3FREE 4.3 (H) 12/20/2019   T3FREE 7.1 (H) 11/08/2019   T3FREE 3.8 07/12/2019   T3FREE 3.1 06/05/2019   T3FREE 3.4 05/03/2019   T3FREE 3.4 03/27/2019   T3FREE 4.0 11/29/2018   T3FREE 4.6 (H) 10/31/2018   Her TSI antibodies were not elevated: Lab Results  Component Value Date   TSI 112 08/16/2018   Thyroid uptake  and scan (10/13/2018): Uptake at 4 hours 29.4%, uptake in 24 hours, 42.5%, both elevated.  Uniform scan, consistent with Graves' disease.  08/2018: We started methimazole (MMI), initially 5 mg daily. 05/2018: MMI increase to 5 mg 2x a day 10/2018: MMI decrease to 5 mg in a.m. and 2.5 mg in p.m. 11/2018: She increased MMI dose to 5 mg 2x a day 03/2019: MMI decreased to 5 mg daily 05/2019: MMI decreased to 2.5 mg daily 08/2019: she stopped MMI by herself 10/2019: MMI restarted at 5 mg daily  Pt denies: - feeling nodules in neck - hoarseness - dysphagia - choking - SOB with lying down  She initially had the following symptoms: Diarrhea, which improved after changing her diet and cutting out meat and dairy.   Occasional palpitations Fatigue Insomnia These have all resolved.  She also had tremors in the past, which resolved.  No FH of thyroid disease or thyroid cancer. No h/o radiation tx to head or neck.  No Biotin use. No recent steroids use.   + Added CBD oil at night which helps her sleep. Of note, a DXA scan was normal in 2017. 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 She also has a history of frozen shoulders.  ROS: Constitutional: no weight gain/no weight loss, no fatigue, no subjective hyperthermia, no subjective hypothermia Eyes: no blurry vision, no xerophthalmia ENT: no sore throat, + see HPI  Cardiovascular: no CP/no SOB/no palpitations/no leg swelling Respiratory: no cough/no SOB/no wheezing Gastrointestinal: no N/no V/+ D/no C/no acid reflux Musculoskeletal: no muscle aches/+ joint aches Skin: no rashes, no hair loss Neurological: no tremors/no numbness/no tingling/no dizziness  I reviewed pt's medications, allergies, PMH, social hx, family hx, and changes were documented in the history of present illness. Otherwise, unchanged from my initial visit note.  Past Medical History:  Diagnosis Date   Cancer [facility])    Pre cancer skin   Graves disease 09/2018    Heart murmur    Past Surgical History:  Procedure Laterality Date   DILATATION & CURETTAGE/HYSTEROSCOPY WITH MYOSURE N/A 01/01/2015   Procedure: DILATATION & CURETTAGE/HYSTEROSCOPY WITH MYOSURE;  Surgeon: Terrance Mass, MD;  Location: [facility];  Service: Gynecology;  Laterality: N/A;   pyloric stenosis  1962   Social History   Socioeconomic History   Marital status: Single    Spouse name: Not on file   Number of children: 4   Years of education: Not on file   Highest education level: Not on file  Occupational History    Communication coordinator  Tobacco Use   Smoking status: Never Smoker   Smokeless tobacco: Never Used  Substance and Sexual Activity   Alcohol use: Yes    Alcohol/week: 2.0 standard drinks    Types: 2 Glasses of wine per week    Comment: LITTLE   Drug use: Yes    Comment: Edible Marijuana tried while in [place]   Sexual activity: Not Currently    Birth control/protection: Other-see comments    Comment: vasectomy, INTERCOURSE AGE 89, SEXUAL PARTNERS MORE THAN 5   Current Outpatient Medications on File Prior to Visit  Medication Sig Dispense Refill   ALPRAZolam (XANAX) 0.25 MG tablet TAKE 1 TABLET AT BEDTIME AS NEEDED FOR SLEEP 30 tablet 0   acyclovir (ZOVIRAX) 200 MG capsule Take 5 times daily for 3 to 5 days as needed 90 capsule 4   methimazole (TAPAZOLE) 5 MG tablet Take 1 tablet (5 mg total) by mouth daily. 180 tablet 1   OVER THE COUNTER MEDICATION CBD gummy     No current facility-administered medications on file prior to visit.   No Known Allergies Family History  Problem Relation Age of Onset   Hypertension Father    Cancer Father 30       pancreatic   Hypertension Brother    Heart disease Maternal Grandfather    Colon cancer Paternal Uncle 21   Stomach cancer Neg Hx    Esophageal cancer Neg Hx     PE: BP 120/80 (BP Location: Right Arm, Patient Position: Sitting, Cuff Size: Normal)   Pulse 71   Ht 5\' 3"  (1.6 m)   Wt 140 lb 12.8 oz (63.9  kg)   LMP 10/30/2014   SpO2 97%   BMI 24.94 kg/m  Wt Readings from Last 3 Encounters:  07/17/20 140 lb 12.8 oz (63.9 kg)  11/08/19 141 lb 9.6 oz (64.2 kg)  08/09/19 145 lb 12.8 oz (66.1 kg)   Constitutional: normal weight, in NAD Eyes: PERRLA, EOMI, no exophthalmos ENT: moist mucous membranes, no thyromegaly, no cervical lymphadenopathy Cardiovascular: tachycardia, RR, No MRG Respiratory: CTA B Gastrointestinal: abdomen soft, NT, ND, BS+ Musculoskeletal: no deformities, strength intact in all 4 Skin: moist, warm, no rashes Neurological: no tremor with outstretched hands, DTR normal in all 4  ASSESSMENT: 1.  Graves' disease  PLAN:  1. Patient with history of Graves' disease, diagnosed after being found to  be thyrotoxic and symptomatic with diarrhea, palpitations, but without anxiety, heat intolerance, or significant weight loss.  She complained of weight gain of 30 pounds approximately a year ago but then she was able to lose weight on a plant-based diet.  At last visit she was working on eliminating meat and cheese. -We started methimazole after diagnosis and continued to adjust the dose.  At last visit, she came off methimazole by herself but TFTs were all abnormal so we had to restart at 5 mg daily. -Subsequent TFTs were improved 02/2020 with normal free T4 and free T3 and low but detectable TSH.  We continued the same methimazole dose at that time. -At today's visit, she does not have thyrotoxic signs or symptoms -We again discussed about definitive modalities of treatment for Graves' disease, to include RAI treatment or surgery, but since she responded very well to methimazole, for now, we will continue this -At this visit, we will recheck her TFTs and change the methimazole dose accordingly -I will see her back in 6 months  Component     Latest Ref Rng & Units 07/17/2020  TSH     0.35 - 4.50 uIU/mL 1.50  T4,Free(Direct)     0.60 - 1.60 ng/dL 0.66   Triiodothyronine,Free,Serum     2.3 - 4.2 pg/mL 3.5  All tests are normal.  For now, I would suggest to continue the same dose of methimazole.  Philemon Kingdom, MD PhD [facility]

## 2020-07-17 NOTE — Patient Instructions (Addendum)
Please stop at the lab.  Please continue Methimazole 5 mg daily for now.  Please come back for a follow-up appointment in 6 months.

## 2020-08-14 ENCOUNTER — Other Ambulatory Visit: Payer: Self-pay | Admitting: Nurse Practitioner

## 2020-08-14 DIAGNOSIS — B009 Herpesviral infection, unspecified: Secondary | ICD-10-CM

## 2020-08-14 NOTE — Telephone Encounter (Signed)
Annual exam due this month, it is not scheduled.

## 2020-08-23 ENCOUNTER — Other Ambulatory Visit: Payer: Self-pay | Admitting: Nurse Practitioner

## 2020-08-23 DIAGNOSIS — B009 Herpesviral infection, unspecified: Secondary | ICD-10-CM

## 2020-09-05 ENCOUNTER — Other Ambulatory Visit: Payer: Self-pay | Admitting: Internal Medicine

## 2020-12-18 ENCOUNTER — Ambulatory Visit: Payer: BC Managed Care – PPO | Admitting: Nurse Practitioner

## 2020-12-23 DIAGNOSIS — L02612 Cutaneous abscess of left foot: Secondary | ICD-10-CM | POA: Diagnosis not present

## 2020-12-23 DIAGNOSIS — B351 Tinea unguium: Secondary | ICD-10-CM | POA: Diagnosis not present

## 2021-01-06 ENCOUNTER — Encounter: Payer: Self-pay | Admitting: Internal Medicine

## 2021-01-06 ENCOUNTER — Ambulatory Visit (INDEPENDENT_AMBULATORY_CARE_PROVIDER_SITE_OTHER): Payer: BC Managed Care – PPO | Admitting: Internal Medicine

## 2021-01-06 ENCOUNTER — Other Ambulatory Visit: Payer: Self-pay

## 2021-01-06 VITALS — BP 124/70 | HR 58 | Ht 63.0 in | Wt 147.6 lb

## 2021-01-06 DIAGNOSIS — R635 Abnormal weight gain: Secondary | ICD-10-CM

## 2021-01-06 DIAGNOSIS — E0500 Thyrotoxicosis with diffuse goiter without thyrotoxic crisis or storm: Secondary | ICD-10-CM | POA: Diagnosis not present

## 2021-01-06 LAB — T4, FREE: Free T4: 1.06 ng/dL (ref 0.60–1.60)

## 2021-01-06 LAB — T3, FREE: T3, Free: 4.8 pg/mL — ABNORMAL HIGH (ref 2.3–4.2)

## 2021-01-06 LAB — TSH: TSH: 0.04 u[IU]/mL — ABNORMAL LOW (ref 0.35–5.50)

## 2021-01-06 MED ORDER — METHIMAZOLE 5 MG PO TABS: 5.0000 mg | ORAL_TABLET | Freq: Every day | ORAL | 1 refills | Status: DC

## 2021-01-06 MED ORDER — METHIMAZOLE 5 MG PO TABS: ORAL_TABLET | ORAL | 1 refills | Status: DC

## 2021-01-06 NOTE — Progress Notes (Signed)
Patient ID: Sarah Jimenez, female   DOB: [birth year], 60 y.o.   MRN: [id number]   This visit occurred during the SARS-CoV-2 public health emergency.  Safety protocols were in place, including screening questions prior to the visit, additional usage of staff PPE, and extensive cleaning of exam room while observing appropriate contact time as indicated for disinfecting solutions.   HPI  Sarah Jimenez is a 60 y.o.-year-old female, initially referred by her gastroenterologist, Dr. Tarri Glenn, returning for follow-up for Graves' disease. Last visit 6 months ago.    Interim history: At this visit, she feels well, without fatigue, tremors, palpitations, heat intolerance, insomnia, anxiety.  She does complain of weight gain-gained 7 pounds since last visit.  Reviewed history: Patient was found to have an undetectable TSH during investigation of diarrhea, which started in 04/2017.  Previous TSH levels from 2017 and 2014 were normal.  Reviewed her TFTs: Lab Results  Component Value Date   TSH 1.50 07/17/2020   TSH 0.01 (L) 03/06/2020   TSH <0.01 Repeated and verified X2. (L) 01/31/2020   TSH <0.01 (L) 12/20/2019   TSH <0.01 (L) 11/08/2019   TSH 3.67 07/12/2019   TSH 4.36 06/05/2019   TSH 3.08 05/03/2019   TSH 16.40 (H) 03/27/2019   TSH <0.01 (L) 11/29/2018   FREET4 0.66 07/17/2020   FREET4 0.83 03/06/2020   FREET4 0.90 01/31/2020   FREET4 1.14 12/20/2019   FREET4 1.86 (H) 11/08/2019   FREET4 0.86 07/12/2019   FREET4 0.69 06/05/2019   FREET4 0.81 05/03/2019   FREET4 0.51 (L) 03/27/2019   FREET4 0.88 11/29/2018   T3FREE 3.5 07/17/2020   T3FREE 3.7 03/06/2020   T3FREE 3.9 01/31/2020   T3FREE 4.3 (H) 12/20/2019   T3FREE 7.1 (H) 11/08/2019   T3FREE 3.8 07/12/2019   T3FREE 3.1 06/05/2019   T3FREE 3.4 05/03/2019   T3FREE 3.4 03/27/2019   T3FREE 4.0 11/29/2018   Her TSI antibodies were not elevated: Lab Results  Component Value Date   TSI 112 08/16/2018   Thyroid uptake and scan  (10/13/2018): Uptake at 4 hours 29.4%, uptake in 24 hours, 42.5%, both elevated.  Uniform scan, consistent with Graves' disease.  08/2018: We started methimazole (MMI), initially 5 mg daily. 05/2018: MMI increase to 5 mg 2x a day 10/2018: MMI decrease to 5 mg in a.m. and 2.5 mg in p.m. 11/2018: She increased MMI dose to 5 mg 2x a day 03/2019: MMI decreased to 5 mg daily 05/2019: MMI decreased to 2.5 mg daily 08/2019: she stopped MMI by herself 10/2019: MMI restarted at 5 mg daily  Pt denies: - feeling nodules in neck - hoarseness - dysphagia - choking - SOB with lying down  She initially had the following symptoms: Diarrhea, which improved after changing her diet and cutting out meat and dairy.   Occasional palpitations Fatigue Insomnia Tremors The symptoms have all resolved.  No FH of thyroid disease or thyroid cancer. No h/o radiation tx to head or neck.  No Biotin use. No recent steroids use.   + CBD oil at night - helps her sleep. Of note, a DXA scan was normal in 2017. 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 She also has a history of frozen shoulders.  ROS: + see HPI  I reviewed pt's medications, allergies, PMH, social hx, family hx, and changes were documented in the history of present illness. Otherwise, unchanged from my initial visit note.  Past Medical History:  Diagnosis Date   Cancer ([place])  Pre cancer skin   Graves disease 09/2018   Heart murmur    Past Surgical History:  Procedure Laterality Date   DILATATION & CURETTAGE/HYSTEROSCOPY WITH MYOSURE N/A 01/01/2015   Procedure: DILATATION & CURETTAGE/HYSTEROSCOPY WITH MYOSURE;  Surgeon: Terrance Mass, MD;  Location: [facility];  Service: Gynecology;  Laterality: N/A;   pyloric stenosis  1962   Social History   Socioeconomic History   Marital status: Single    Spouse name: Not on file   Number of children: 4   Years of education: Not on file   Highest education level: Not on file   Occupational History    Communication coordinator  Tobacco Use   Smoking status: Never Smoker   Smokeless tobacco: Never Used  Substance and Sexual Activity   Alcohol use: Yes    Alcohol/week: 2.0 standard drinks    Types: 2 Glasses of wine per week    Comment: LITTLE   Drug use: Yes    Comment: Edible Marijuana tried while in [place]   Sexual activity: Not Currently    Birth control/protection: Other-see comments    Comment: vasectomy, INTERCOURSE AGE 89, SEXUAL PARTNERS MORE THAN 5   Current Outpatient Medications on File Prior to Visit  Medication Sig Dispense Refill   acyclovir (ZOVIRAX) 200 MG capsule TAKE 5 TIMES DAILY FOR 3 TO 5 DAYS AS NEEDED 90 capsule 0   ALPRAZolam (XANAX) 0.25 MG tablet TAKE 1 TABLET AT BEDTIME AS NEEDED FOR SLEEP 30 tablet 0   methimazole (TAPAZOLE) 5 MG tablet TAKE 1 TABLET BY MOUTH TWICE A DAY 180 tablet 1   OVER THE COUNTER MEDICATION CBD gummy     No current facility-administered medications on file prior to visit.   No Known Allergies Family History  Problem Relation Age of Onset   Hypertension Father    Cancer Father 31       pancreatic   Hypertension Brother    Heart disease Maternal Grandfather    Colon cancer Paternal Uncle 82   Stomach cancer Neg Hx    Esophageal cancer Neg Hx     PE: BP 124/70 (BP Location: Right Arm, Patient Position: Sitting, Cuff Size: Normal)   Pulse (!) 58   Ht 5\' 3"  (1.6 m)   Wt 147 lb 9.6 oz (67 kg)   LMP 10/30/2014   SpO2 98%   BMI 26.15 kg/m  Wt Readings from Last 3 Encounters:  01/06/21 147 lb 9.6 oz (67 kg)  07/17/20 140 lb 12.8 oz (63.9 kg)  11/08/19 141 lb 9.6 oz (64.2 kg)   Constitutional: normal weight, in NAD Eyes: PERRLA, EOMI, no exophthalmos ENT: moist mucous membranes, no thyromegaly, no cervical lymphadenopathy Cardiovascular: RRR, No MRG Respiratory: CTA B Musculoskeletal: no deformities, strength intact in all 4 Skin: moist, warm, no rashes Neurological: + very faint tremor  with outstretched hands, DTR normal in all 4  ASSESSMENT: 1.  Graves' disease  2. Weight gain  PLAN:  1. Patient with history of Graves' disease, diagnosed after being found to be thyrotoxic and symptomatic with diarrhea, palpitations, but without anxiety, heat intolerance, or significant weight loss.   -Restarting methimazole after diagnosis and continues to adjust the dose.  Before last visit, she came off methimazole by herself but TFTs became abnormal so we had to restart methimazole at 5 mg daily.   -At last visit TFTs were normal so we continued the same dose of methimazole -she continues now on 5 mg daily -At this visit, she denies thyrotoxic  symptoms.  She did gain weight and is interested in reducing the dose of methimazole if possible.  -There is no tachycardia or tremors on exam and her blood pressure is normal. -Also, no symptoms or signs of active Graves' ophthalmopathy: No blurry vision, double vision, eye pain, chemosis -We discussed at previous visits about definitive modalities of treatment for Graves' disease to include RAI treatment or surgery, but since she responded very well to methimazole, we continued this.  For now, she agrees to continue with methimazole.  I am hoping we can decrease the dose today. -Today we will recheck her TFTs and change the methimazole dose accordingly -I will see her back in 1 year but with labs in 6 months  2.  Weight gain -She was able to lose approximately 30 pounds on a more plant-based diet in the past but before last visit she switched to an omnivorous diet  -She gained 7 pounds since last visit -We discussed that this may be related to resolution of her Graves' disease and not necessarily a side effect of methimazole.  However, at today's visit, if her TSH is higher than before, we discussed about possibly reducing the methimazole dose. -She is off the beta-blocker, which can also cause weight gain -We discussed that she can start a diet  and exercise if her TFTs are normal today.  Component     Latest Ref Rng & Units 01/06/2021  TSH     0.35 - 5.50 uIU/mL 0.04 (L)  T4,Free(Direct)     0.60 - 1.60 ng/dL 1.06  Triiodothyronine,Free,Serum     2.3 - 4.2 pg/mL 4.8 (H)  Unfortunately, TFTs are abnormal so we will need to increase the dose of methimazole.  For now, I would suggest to increase it slightly by 2.5 mg daily and recheck her test in 4 to 5 weeks.  Philemon Kingdom, MD PhD [facility] Endocrin[facility]

## 2021-01-06 NOTE — Patient Instructions (Signed)
Please stop at the lab.  Please continue Methimazole 5 mg daily for now.  Please come back for a follow-up appointment in 1 year but labs at 6 months.

## 2021-01-07 DIAGNOSIS — B351 Tinea unguium: Secondary | ICD-10-CM | POA: Diagnosis not present

## 2021-02-18 ENCOUNTER — Encounter: Payer: Self-pay | Admitting: Internal Medicine

## 2021-02-19 DIAGNOSIS — L603 Nail dystrophy: Secondary | ICD-10-CM | POA: Diagnosis not present

## 2021-02-19 DIAGNOSIS — D485 Neoplasm of uncertain behavior of skin: Secondary | ICD-10-CM | POA: Diagnosis not present

## 2021-02-19 DIAGNOSIS — Z85828 Personal history of other malignant neoplasm of skin: Secondary | ICD-10-CM | POA: Diagnosis not present

## 2021-02-19 DIAGNOSIS — L821 Other seborrheic keratosis: Secondary | ICD-10-CM | POA: Diagnosis not present

## 2021-02-19 DIAGNOSIS — L570 Actinic keratosis: Secondary | ICD-10-CM | POA: Diagnosis not present

## 2021-02-19 DIAGNOSIS — D0472 Carcinoma in situ of skin of left lower limb, including hip: Secondary | ICD-10-CM | POA: Diagnosis not present

## 2021-02-19 DIAGNOSIS — D2272 Melanocytic nevi of left lower limb, including hip: Secondary | ICD-10-CM | POA: Diagnosis not present

## 2021-02-19 DIAGNOSIS — D225 Melanocytic nevi of trunk: Secondary | ICD-10-CM | POA: Diagnosis not present

## 2021-02-25 ENCOUNTER — Other Ambulatory Visit: Payer: BC Managed Care – PPO

## 2021-02-26 ENCOUNTER — Other Ambulatory Visit: Payer: Self-pay

## 2021-02-26 ENCOUNTER — Other Ambulatory Visit (INDEPENDENT_AMBULATORY_CARE_PROVIDER_SITE_OTHER): Payer: BC Managed Care – PPO

## 2021-02-26 DIAGNOSIS — E0500 Thyrotoxicosis with diffuse goiter without thyrotoxic crisis or storm: Secondary | ICD-10-CM

## 2021-02-26 LAB — T3, FREE: T3, Free: 3.8 pg/mL (ref 2.3–4.2)

## 2021-02-26 LAB — TSH: TSH: 0.04 u[IU]/mL — ABNORMAL LOW (ref 0.35–5.50)

## 2021-02-26 LAB — T4, FREE: Free T4: 0.89 ng/dL (ref 0.60–1.60)

## 2021-03-20 ENCOUNTER — Ambulatory Visit (INDEPENDENT_AMBULATORY_CARE_PROVIDER_SITE_OTHER): Payer: BC Managed Care – PPO | Admitting: Family Medicine

## 2021-03-20 ENCOUNTER — Encounter: Payer: Self-pay | Admitting: Family Medicine

## 2021-03-20 ENCOUNTER — Other Ambulatory Visit: Payer: Self-pay

## 2021-03-20 VITALS — BP 122/70 | HR 60 | Temp 98.1°F | Ht 63.0 in | Wt 148.4 lb

## 2021-03-20 DIAGNOSIS — Z23 Encounter for immunization: Secondary | ICD-10-CM

## 2021-03-20 DIAGNOSIS — E0500 Thyrotoxicosis with diffuse goiter without thyrotoxic crisis or storm: Secondary | ICD-10-CM

## 2021-03-20 DIAGNOSIS — F5101 Primary insomnia: Secondary | ICD-10-CM

## 2021-03-20 NOTE — Patient Instructions (Addendum)
Welcome to [facility]! It was a pleasure meeting you today.  As discussed, Please schedule a 12 month follow up visit today. Nurse visit 69mo at least-shingrix  PLEASE NOTE:  If you had any LAB tests please let us know if you have not heard back within a few days. You may see your results on MyChart before we have a chance to review them but we will give you a call once they are reviewed by [place]. If we ordered any REFERRALS today, please let us know if you have not heard from their office within the next week.  Let us know through MyChart if you are needing REFILLS, or have your pharmacy send [place] the request. You can also use MyChart to communicate with me or any office staff.  Please try these tips to maintain a healthy lifestyle:  Eat most of your calories during the day when you are active. Eliminate processed foods including packaged sweets (pies, cakes, cookies), reduce intake of potatoes, white bread, white pasta, and white rice. Look for whole grain options, oat flour or almond flour.  Each meal should contain half fruits/vegetables, one quarter protein, and one quarter carbs (no bigger than a computer mouse).  Cut down on sweet beverages. This includes juice, soda, and sweet tea. Also watch fruit intake, though this is a healthier sweet option, it still contains natural sugar! Limit to 3 servings daily.  Drink at least 1 glass of water with each meal and aim for at least 8 glasses per day  Exercise at least 150 minutes every week.

## 2021-03-20 NOTE — Progress Notes (Signed)
New Patient Office Visit  Subjective:  Patient ID: Sarah Jimenez, female    DOB: 1960/12/11  Age: 61 y.o. MRN: [id number]  CC:  Chief Complaint  Patient presents with   Establish Care    HPI-4 children, communications coord for 3 churches Sarah Jimenez presents for new  Insomnia-xanax for sleep, but cbd gummies work. Pre skin ca-on effudex-pt only does on spots. Graves-sees endo-on methimazole-dose was increased but then abd pain so dec back to 5mg  and ok. No symptoms HSV-rare use of zovirax  Colon 10 yrs.  Sees gyn next mo. Mamm due.    Past Medical History:  Diagnosis Date   Cancer [facility])    Pre cancer skin   Graves disease 09/2018   Heart murmur     Past Surgical History:  Procedure Laterality Date   DILATATION & CURETTAGE/HYSTEROSCOPY WITH MYOSURE N/A 01/01/2015   Procedure: DILATATION & CURETTAGE/HYSTEROSCOPY WITH MYOSURE;  Surgeon: Terrance Mass, MD;  Location: [facility];  Service: Gynecology;  Laterality: N/A;   pyloric stenosis  1962    Family History  Problem Relation Age of Onset   Hypertension Father    Cancer Father 38       pancreatic   Cancer Sister    Depression Brother    Mental illness Brother    Hypertension Daughter    Kidney disease Son    Colon cancer Paternal Uncle 104   Early death Maternal Grandmother    Hearing loss Maternal Grandfather    Early death Maternal Grandfather    Heart disease Maternal Grandfather    Stomach cancer Neg Hx    Esophageal cancer Neg Hx     Social History   Socioeconomic History   Marital status: Single    Spouse name: Not on file   Number of children: Not on file   Years of education: Not on file   Highest education level: Not on file  Occupational History   Not on file  Tobacco Use   Smoking status: Never   Smokeless tobacco: Never  Vaping Use   Vaping Use: Never used  Substance and Sexual Activity   Alcohol use: Yes    Alcohol/week: 4.0 - 5.0 standard drinks    Types: 4 - 5 Glasses of  wine per week    Comment: LITTLE   Drug use: Yes    Comment: Edible Marijuana tried while in [place]   Sexual activity: Not Currently    Birth control/protection: Other-see comments    Comment: vasectomy, INTERCOURSE AGE 25, SEXUAL PARTNERS MORE THAN 5  Other Topics Concern   Not on file  Social History Narrative   Not on file   Social Determinants of Health   Financial Resource Strain: Not on file  Food Insecurity: Not on file  Transportation Needs: Not on file  Physical Activity: Not on file  Stress: Not on file  Social Connections: Not on file  Intimate Partner Violence: Not on file    ROS  As in HPI.  A lot of muscle pain, achey.  Some arth.    Objective:   Today's Vitals: BP 122/70    Pulse 60    Temp 98.1 F (36.7 C) (Temporal)    Ht 5\' 3"  (1.6 m)    Wt 148 lb 6 oz (67.3 kg)    LMP 10/30/2014    SpO2 96%    BMI 26.28 kg/m   Gen: WDWN NAD WF HEENT: NCAT, conjunctiva not injected, sclera nonicteric TM WNL B, OP  moist, no exudates  NECK:  supple, no thyromegaly, no nodes, no carotid bruits CARDIAC: RRR, S1S2+, no murmur. DP 2+B LUNGS: CTAB. No wheezes ABDOMEN:  BS+, soft, NTND, No HSM, no masses EXT:  no edema MSK: no gross abnormalities.  NEURO: A&O x3.  CN II-XII intact.  PSYCH: normal mood. Good eye contact   Assessment & Plan:   Problem List Items Addressed This Visit       Endocrine   Graves disease - Primary   Other Visit Diagnoses     Primary insomnia       Need for shingles vaccine       Relevant Orders   Varicella-zoster vaccine IM (Shingrix) (Completed)      Graves-side effects when meds increased so back on 5mg  only.  Will try again.  Managed by Endo Insomnia-xanax prn-but doing fine on CBD gummies so not needing RHM-gets everything thru GYN so will do hep C and other labs and mamm/colon/pap.   Got Shingrix #1 today here.  Outpatient Encounter Medications as of 03/20/2021  Medication Sig   acyclovir (ZOVIRAX) 200 MG capsule TAKE 5 TIMES  DAILY FOR 3 TO 5 DAYS AS NEEDED   ALPRAZolam (XANAX) 0.25 MG tablet TAKE 1 TABLET AT BEDTIME AS NEEDED FOR SLEEP   EFUDEX 5 % cream Apply topically.   methimazole (TAPAZOLE) 5 MG tablet Take by mouth 5 mg in a.m. and 2.5 mg in p.m.   Multiple Vitamin (MULTIVITAMIN PO) 1 tablet daily.   OVER THE COUNTER MEDICATION CBD gummy   No facility-administered encounter medications on file as of 03/20/2021.    Follow-up: Return in about 1 year (around 03/20/2022) for annual.   Wellington Hampshire, MD

## 2021-04-02 ENCOUNTER — Ambulatory Visit (INDEPENDENT_AMBULATORY_CARE_PROVIDER_SITE_OTHER): Payer: BC Managed Care – PPO | Admitting: Nurse Practitioner

## 2021-04-02 ENCOUNTER — Other Ambulatory Visit: Payer: Self-pay

## 2021-04-02 ENCOUNTER — Encounter: Payer: Self-pay | Admitting: Nurse Practitioner

## 2021-04-02 ENCOUNTER — Other Ambulatory Visit (HOSPITAL_COMMUNITY)
Admission: RE | Admit: 2021-04-02 | Discharge: 2021-04-02 | Disposition: A | Discharge status: Home / Self Care | Payer: BC Managed Care – PPO | Source: Ambulatory Visit | Attending: Nurse Practitioner | Admitting: Nurse Practitioner

## 2021-04-02 VITALS — BP 118/74 | Ht 62.0 in | Wt 149.0 lb

## 2021-04-02 DIAGNOSIS — B009 Herpesviral infection, unspecified: Secondary | ICD-10-CM

## 2021-04-02 DIAGNOSIS — Z780 Asymptomatic menopausal state: Secondary | ICD-10-CM

## 2021-04-02 DIAGNOSIS — Z01419 Encounter for gynecological examination (general) (routine) without abnormal findings: Secondary | ICD-10-CM | POA: Insufficient documentation

## 2021-04-02 DIAGNOSIS — E559 Vitamin D deficiency, unspecified: Secondary | ICD-10-CM | POA: Diagnosis not present

## 2021-04-02 DIAGNOSIS — Z13820 Encounter for screening for osteoporosis: Secondary | ICD-10-CM

## 2021-04-02 DIAGNOSIS — E785 Hyperlipidemia, unspecified: Secondary | ICD-10-CM | POA: Diagnosis not present

## 2021-04-02 DIAGNOSIS — Z1159 Encounter for screening for other viral diseases: Secondary | ICD-10-CM

## 2021-04-02 IMAGING — MG MM DIGITAL SCREENING BILAT W/ TOMO AND CAD
8 series · 9 of 24 positions shown · non-contrast
Comparison: Previous exam(s).

CLINICAL DATA: Screening.

EXAM:
DIGITAL SCREENING BILATERAL MAMMOGRAM WITH TOMOSYNTHESIS AND CAD
TECHNIQUE: Bilateral screening digital craniocaudal and mediolateral oblique
mammograms were obtained. Bilateral screening digital breast
tomosynthesis was performed. The images were evaluated with
computer-aided detection.

[L MLO synth-2D]
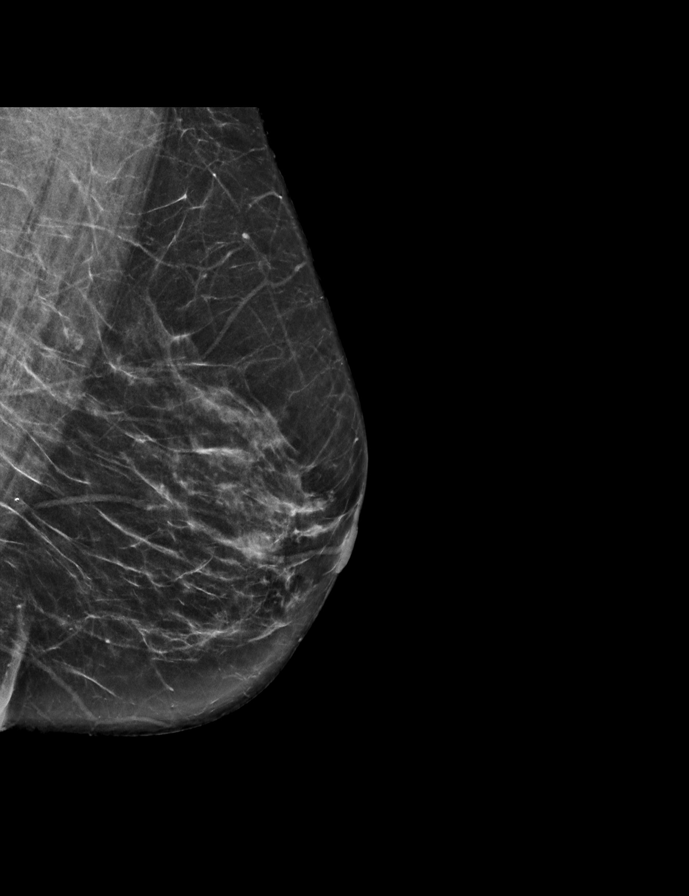

[R CC synth-2D]
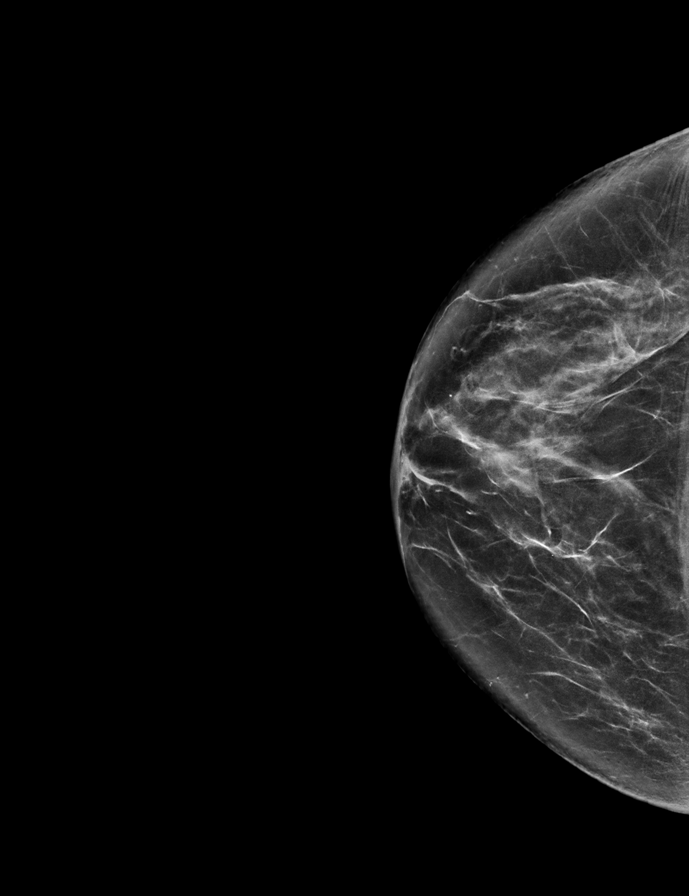

[L CC synth-2D]
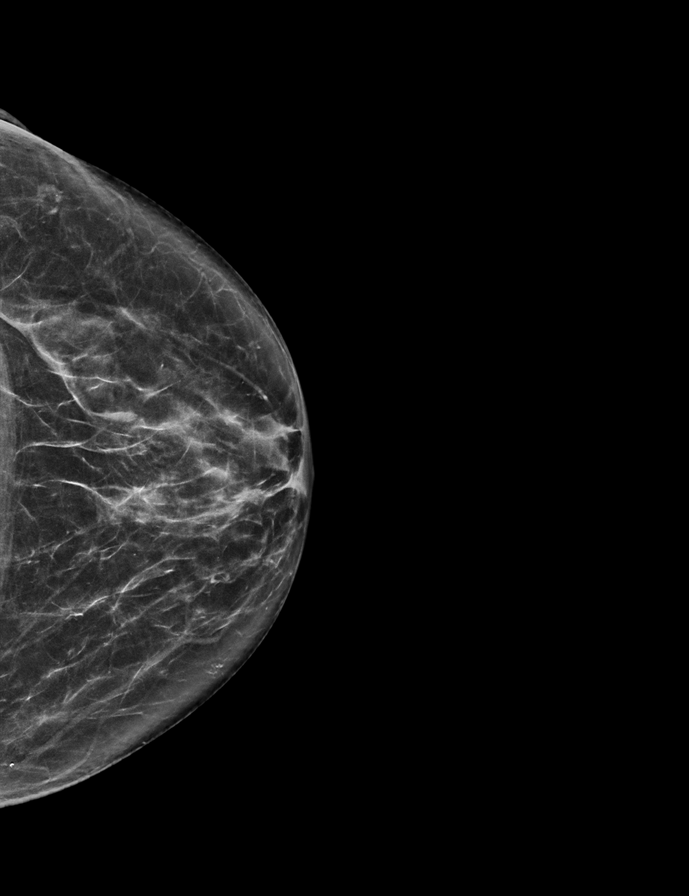

[R MLO synth-2D]
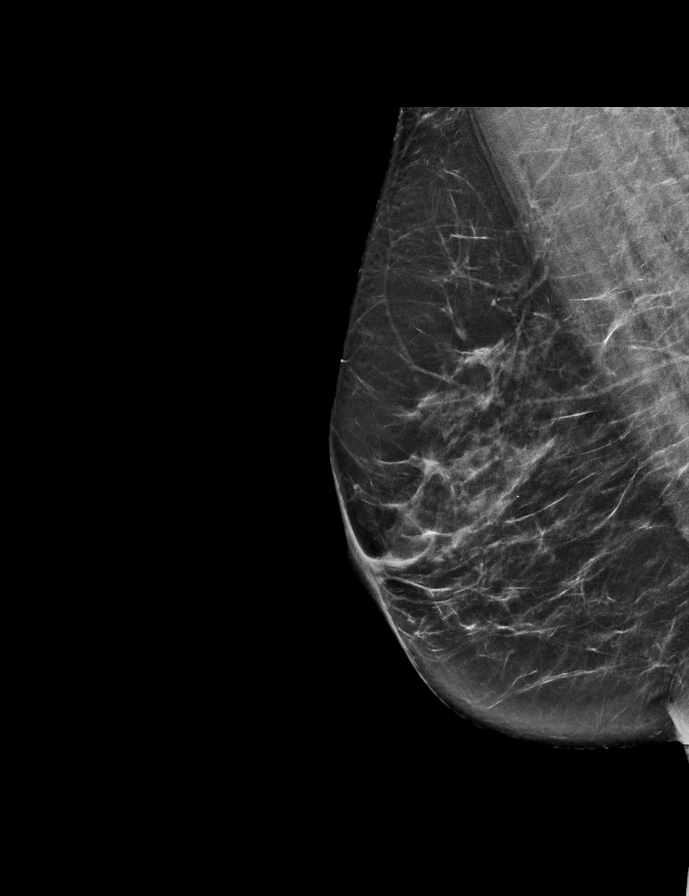

[R CC tomo · 2 of 67 frames shown]
[frame 22/67]
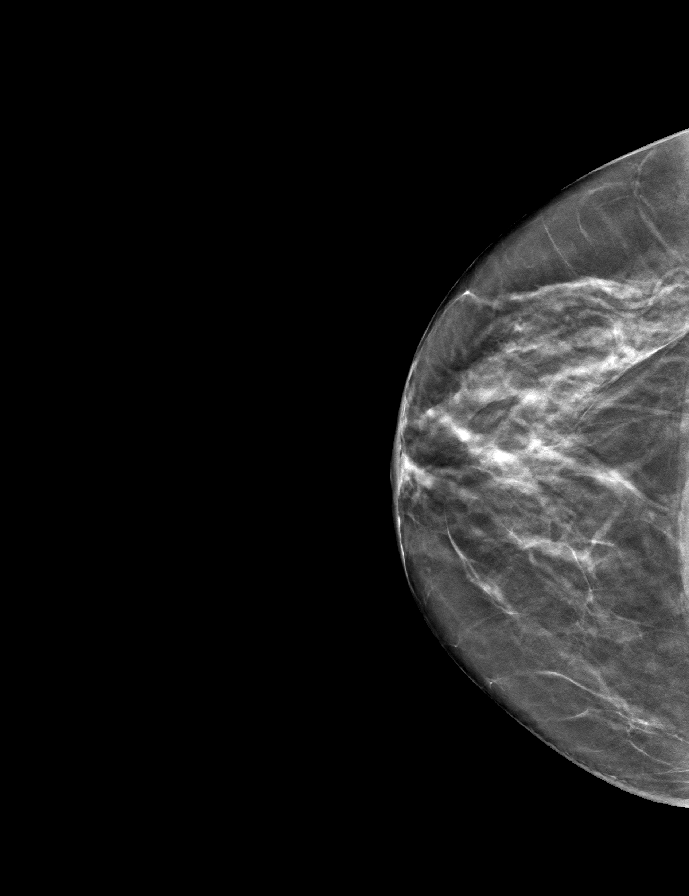
[frame 34/67]
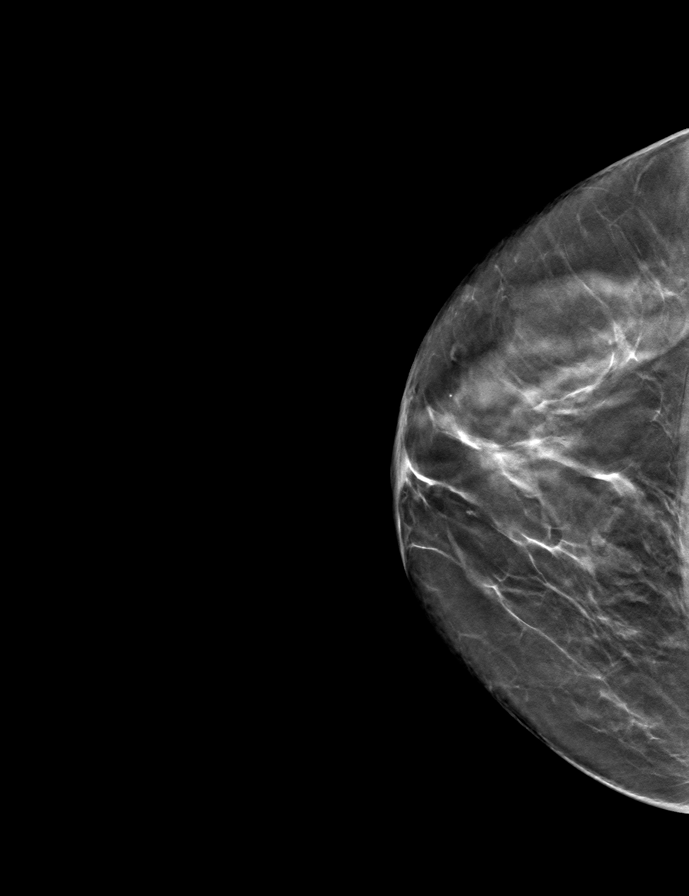

[L MLO tomo · tomo slice 31/62.0]
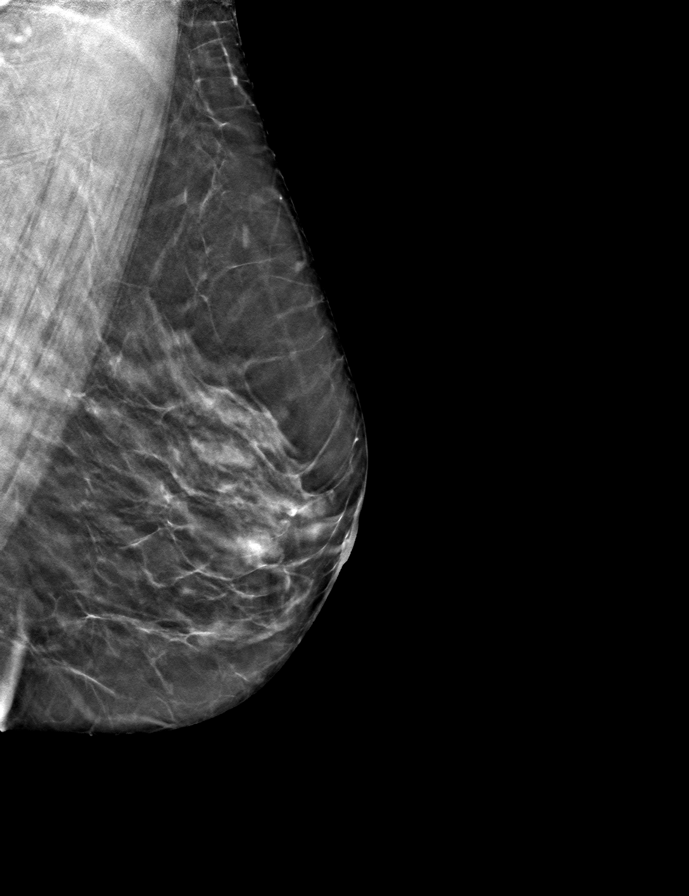

[R MLO tomo · tomo slice 33/65.0]
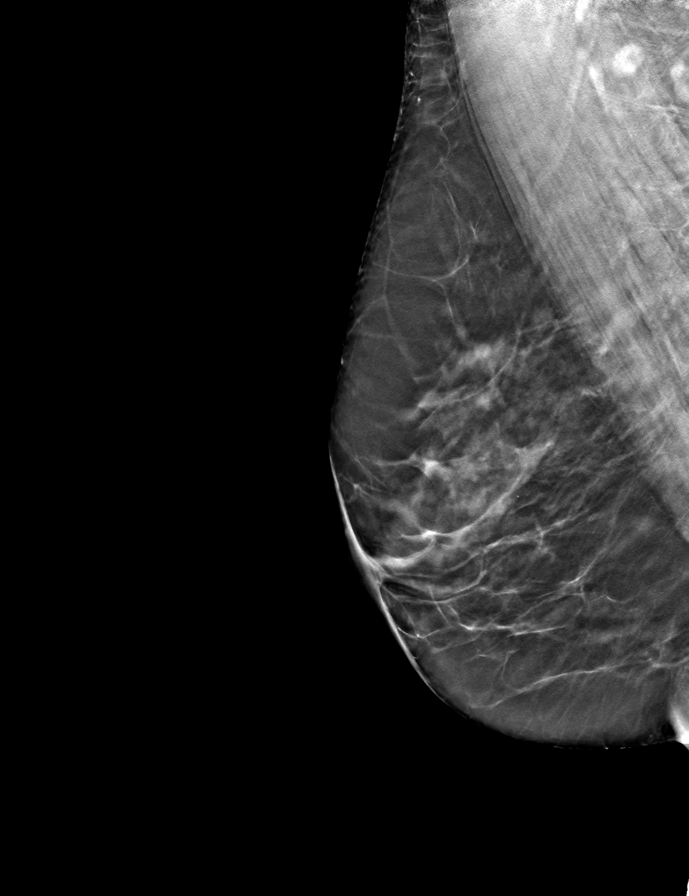

[L CC tomo · tomo slice 31/61.0]
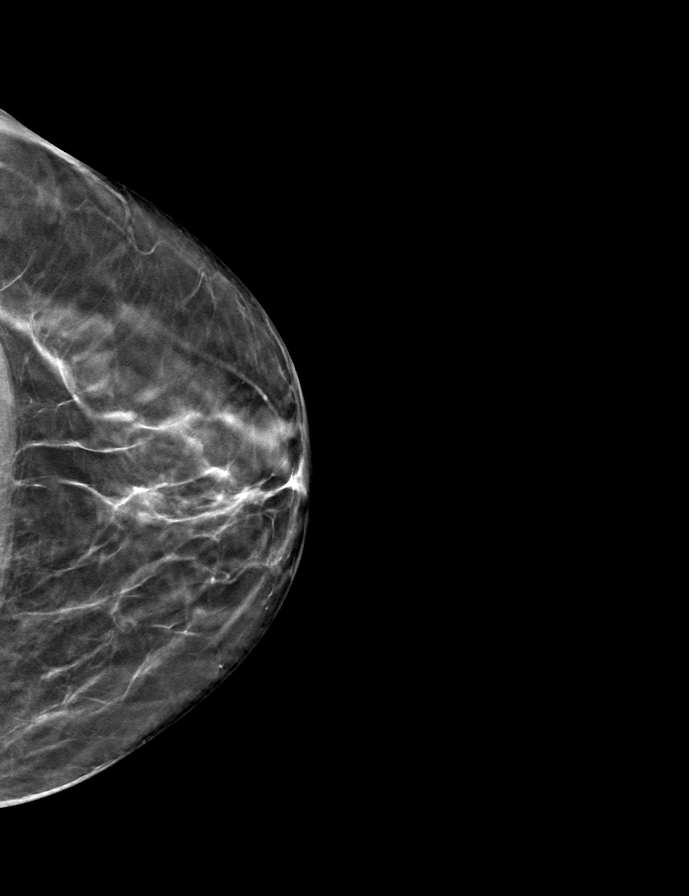

[9 of 24 positions shown; findings below may reference images not displayed]

ACR Breast Density Category c: The breast tissue is heterogeneously
dense, which may obscure small masses.
FINDINGS: There are no findings suspicious for malignancy.
IMPRESSION: No mammographic evidence of malignancy. A result letter of this
screening mammogram will be mailed directly to the patient.

RECOMMENDATION:
Screening mammogram in one year. (Code:Q3-W-BC3)

BI-RADS CATEGORY  1: Negative.

## 2021-04-02 NOTE — Progress Notes (Signed)
? ?Sarah Jimenez 08/12/1960 [id number] ? ? ?History:  61 y.o. G4 P4 presents for annual exam without GYN complaints. Postmenopausal - no HRT, no bleeding. 2017 endometrial polyp removed. Normal pap and mammogram history. HSV,  Acyclovir for outbreaks. Xanax occasionally for insomnia and anxiety. History of Graves disease, followed by endocrinology, on Methimazole.  ? ?Gynecologic History ?Patient's last menstrual period was 10/30/2014. ?  ?Contraception: post menopausal status ?Sexually active: No ? ?Health maintenance ?Last Pap: 07/19/2016. Results were: Normal ?Last mammogram: 03/06/2020. Results were: Normal ?Last colonoscopy: 05/08/2011. Results were: Normal ?Last Dexa: 08/15/2015. Results were: Normal ? ?Past medical history, past surgical history, family history and social history were all reviewed and documented in the EPIC chart. Single. Works remote in [employer], [employer]. 4 children (2 sons, 2 daughters) ages 66-30.  ? ?ROS:  A ROS was performed and pertinent positives and negatives are included. ? ?Exam: ? ?Vitals:  ? 04/02/21 1117  ?BP: 118/74  ?Weight: 149 lb (67.6 kg)  ?Height: '5\' 2"'$  (1.575 m)  ? ? ?Body mass index is 27.25 kg/m?. ? ?General appearance:  Normal ?Thyroid:  Symmetrical, normal in size, without palpable masses or nodularity. ?Respiratory ? Auscultation:  Clear without wheezing or rhonchi ?Cardiovascular ? Auscultation:  Regular rate, without rubs, murmurs or gallops ? Edema/varicosities:  Not grossly evident ?Abdominal ? Soft,nontender, without masses, guarding or rebound. ? Liver/spleen:  No organomegaly noted ? Hernia:  None appreciated ? Skin ? Inspection:  Grossly normal ?  ?Breasts: Examined lying and sitting.  ? Right: Without masses, retractions, discharge or axillary adenopathy. ? ? Left: Without masses, retractions, discharge or axillary adenopathy. ?Genitourinary  ? Inguinal/mons:  Normal without inguinal adenopathy ? External genitalia:  Normal appearing vulva with no masses,  tenderness, or lesions ? BUS/Urethra/Skene's glands:  Normal ? Vagina:  Normal appearing with normal color and discharge, no lesions ? Cervix:  Normal appearing without discharge or lesions ? Uterus:  Normal in size, shape and contour.  Midline and mobile, nontender ? Adnexa/parametria:   ?  Rt: Normal in size, without masses or tenderness. ?  Lt: Normal in size, without masses or tenderness. ? Anus and perineum: Normal ? Digital rectal exam: Normal sphincter tone without palpated masses or tenderness ? ?Patient informed chaperone available to be present for breast and pelvic exam. Patient has requested no chaperone to be present. Patient has been advised what will be completed during breast and pelvic exam.  ? ?Assessment/Plan:  61 y.o. G4 P4 for annual exam.   ? ?Well female exam with routine gynecological exam - Plan: Cytology - PAP( [facility]), CBC with Differential/Platelet, Comprehensive metabolic panel, Lipid panel, HIV Antibody (routine testing w rflx). Education provided on SBEs, importance of preventative screenings, current guidelines, high calcium diet, regular exercise, and multivitamin daily. Has PCP. Received first dose of Shringrex a couple of weeks ago.  ? ?Postmenopausal - Plan: DG Bone Density. No HRT, no bleeding.  ? ?Screening for osteoporosis - Plan: DG Bone Density. Normal bone density in 2017.  ? ?HSV (herpes simplex virus) infection - takes Acyclovir as needed. Rare outbreaks. ? ?Vitamin D deficiency - Plan: VITAMIN D 25 Hydroxy (Vit-D Deficiency, Fractures) ? ?Need for hepatitis C screening test - Plan: Hepatitis C antibody ? ?Screening for cervical cancer - Normal Pap history. Pap with HR HPV today.  ? ?Screening for breast cancer - Normal mammogram history. Overdue and plans to schedule this soon. Continue annual screenings.  Normal breast exam today. ? ?Screening for colon cancer - 2013  colonoscopy. Plans to schedule soon, ? ?Follow up in 1 year for annual. ? ? ? ?Tamela Gammon  [facility], 12:04 PM 04/02/2021 ? ?

## 2021-04-03 ENCOUNTER — Other Ambulatory Visit: Payer: Self-pay | Admitting: Nurse Practitioner

## 2021-04-03 DIAGNOSIS — E559 Vitamin D deficiency, unspecified: Secondary | ICD-10-CM

## 2021-04-03 LAB — COMPREHENSIVE METABOLIC PANEL
AG Ratio: 1.6 (calc) (ref 1.0–2.5)
ALT: 16 U/L (ref 6–29)
AST: 17 U/L (ref 10–35)
Albumin: 4.3 g/dL (ref 3.6–5.1)
Alkaline phosphatase (APISO): 71 U/L (ref 37–153)
BUN: 13 mg/dL (ref 7–25)
CO2: 24 mmol/L (ref 20–32)
Calcium: 9.5 mg/dL (ref 8.6–10.4)
Chloride: 103 mmol/L (ref 98–110)
Creat: 0.78 mg/dL (ref 0.50–1.05)
Globulin: 2.7 g/dL (calc) (ref 1.9–3.7)
Glucose, Bld: 92 mg/dL (ref 65–99)
Potassium: 4.1 mmol/L (ref 3.5–5.3)
Sodium: 139 mmol/L (ref 135–146)
Total Bilirubin: 0.6 mg/dL (ref 0.2–1.2)
Total Protein: 7 g/dL (ref 6.1–8.1)

## 2021-04-03 LAB — LIPID PANEL
Cholesterol: 195 mg/dL (ref <200)
HDL: 71 mg/dL (ref >=50)
LDL Cholesterol (Calc): 109 mg/dL (calc) — ABNORMAL HIGH
Non-HDL Cholesterol (Calc): 124 mg/dL (calc) (ref <130)
Total CHOL/HDL Ratio: 2.7 (calc) (ref <5.0)
Triglycerides: 68 mg/dL (ref <150)

## 2021-04-03 LAB — CBC WITH DIFFERENTIAL/PLATELET
Absolute Monocytes: 468 cells/uL (ref 200–950)
Basophils Absolute: 90 cells/uL (ref 0–200)
Basophils Relative: 1.5 %
Eosinophils Absolute: 120 cells/uL (ref 15–500)
Eosinophils Relative: 2 %
HCT: 43 % (ref 35.0–45.0)
Hemoglobin: 14.1 g/dL (ref 11.7–15.5)
Lymphs Abs: 1728 cells/uL (ref 850–3900)
MCH: 31.3 pg (ref 27.0–33.0)
MCHC: 32.8 g/dL (ref 32.0–36.0)
MCV: 95.6 fL (ref 80.0–100.0)
MPV: 10.1 fL (ref 7.5–12.5)
Monocytes Relative: 7.8 %
Neutro Abs: 3594 cells/uL (ref 1500–7800)
Neutrophils Relative %: 59.9 %
Platelets: 395 10*3/uL (ref 140–400)
RBC: 4.5 10*6/uL (ref 3.80–5.10)
RDW: 12 % (ref 11.0–15.0)
Total Lymphocyte: 28.8 %
WBC: 6 10*3/uL (ref 3.8–10.8)

## 2021-04-03 LAB — HIV ANTIBODY (ROUTINE TESTING W REFLEX): HIV 1&2 Ab, 4th Generation: NONREACTIVE

## 2021-04-03 LAB — HEPATITIS C ANTIBODY
Hepatitis C Ab: NONREACTIVE
SIGNAL TO CUT-OFF: <0.02 (ref <1.00)

## 2021-04-03 LAB — VITAMIN D 25 HYDROXY (VIT D DEFICIENCY, FRACTURES): Vit D, 25-Hydroxy: 24 ng/mL — ABNORMAL LOW (ref 30–100)

## 2021-04-03 MED ORDER — VITAMIN D (ERGOCALCIFEROL) 1.25 MG (50000 UNIT) PO CAPS: 50000.0000 [IU] | ORAL_CAPSULE | ORAL | 0 refills | Status: AC

## 2021-04-07 LAB — CYTOLOGY - PAP
Comment: NEGATIVE
Diagnosis: NEGATIVE
High risk HPV: NEGATIVE

## 2021-04-09 ENCOUNTER — Other Ambulatory Visit: Payer: Self-pay | Admitting: Nurse Practitioner

## 2021-04-09 ENCOUNTER — Encounter: Payer: Self-pay | Admitting: Gastroenterology

## 2021-04-09 DIAGNOSIS — Z1231 Encounter for screening mammogram for malignant neoplasm of breast: Secondary | ICD-10-CM

## 2021-04-29 ENCOUNTER — Ambulatory Visit
Admission: RE | Admit: 2021-04-29 | Discharge: 2021-04-29 | Disposition: A | Discharge status: Home / Self Care | Payer: BC Managed Care – PPO | Source: Ambulatory Visit | Attending: Nurse Practitioner | Admitting: Nurse Practitioner

## 2021-04-29 DIAGNOSIS — Z1231 Encounter for screening mammogram for malignant neoplasm of breast: Secondary | ICD-10-CM

## 2021-05-16 ENCOUNTER — Ambulatory Visit (AMBULATORY_SURGERY_CENTER): Payer: BC Managed Care – PPO | Admitting: *Deleted

## 2021-05-16 VITALS — Ht 62.0 in | Wt 149.0 lb

## 2021-05-16 DIAGNOSIS — Z1211 Encounter for screening for malignant neoplasm of colon: Secondary | ICD-10-CM

## 2021-05-16 MED ORDER — NA SULFATE-K SULFATE-MG SULF 17.5-3.13-1.6 GM/177ML PO SOLN
1.0000 | ORAL | 0 refills | Status: DC
Start: 2021-05-16 — End: 2021-07-31

## 2021-05-16 NOTE — Progress Notes (Signed)
Patient's pre-visit was done today over the phone with the patient. Name,DOB and address verified. Patient denies any allergies to Eggs and Soy. Patient denies any problems with anesthesia/sedation. Patient is not taking any diet pills or blood thinners. No home Oxygen. Insurance confirmed with patient. ? ?Prep instructions sent to pt's MyChart -pt is aware. Patient understands to call us back with any questions or concerns. Patient is aware of our care-partner policy. Pt will use singlecare for rx. ? ?EMMI education assigned to the patient for the procedure, sent to [place].  ? ?The patient is COVID-19 vaccinated.   ?

## 2021-05-21 ENCOUNTER — Encounter: Payer: Self-pay | Admitting: Gastroenterology

## 2021-05-21 ENCOUNTER — Other Ambulatory Visit: Payer: Self-pay | Admitting: Nurse Practitioner

## 2021-05-21 ENCOUNTER — Ambulatory Visit (INDEPENDENT_AMBULATORY_CARE_PROVIDER_SITE_OTHER): Payer: BC Managed Care – PPO

## 2021-05-21 DIAGNOSIS — Z13820 Encounter for screening for osteoporosis: Secondary | ICD-10-CM

## 2021-05-21 DIAGNOSIS — Z780 Asymptomatic menopausal state: Secondary | ICD-10-CM

## 2021-05-30 ENCOUNTER — Ambulatory Visit (AMBULATORY_SURGERY_CENTER): Payer: BC Managed Care – PPO | Admitting: Gastroenterology

## 2021-05-30 ENCOUNTER — Encounter: Payer: Self-pay | Admitting: Gastroenterology

## 2021-05-30 VITALS — BP 130/62 | HR 70 | Temp 98.0°F | Resp 15 | Ht 62.0 in | Wt 149.0 lb

## 2021-05-30 DIAGNOSIS — D122 Benign neoplasm of ascending colon: Secondary | ICD-10-CM | POA: Diagnosis not present

## 2021-05-30 DIAGNOSIS — Z1211 Encounter for screening for malignant neoplasm of colon: Secondary | ICD-10-CM

## 2021-05-30 MED ORDER — SODIUM CHLORIDE 0.9 % IV SOLN: 500.0000 mL | Freq: Once | INTRAVENOUS | Status: DC

## 2021-05-30 NOTE — Progress Notes (Signed)
Report to PACU, RN, vss, BBS= Clear.  

## 2021-05-30 NOTE — Progress Notes (Signed)
? ?Referring Provider: Tawnya Crook, MD ?Primary Care Physician:  Tawnya Crook, MD ? ?Indication for Procedure:  Colon cancer Surveillance ? ? ?IMPRESSION:  ?Need for colon cancer surveillance ?Appropriate candidate for monitored anesthesia care ? ?PLAN: ?Colonoscopy in the [place] today ? ? ?HPI: Sarah Jimenez is a 61 y.o. female presents for surveillance colonoscopy. ? ?Prior endoscopic history: ?- Normal screening colonoscopy with Dr. Olevia Perches 05/08/11.  ? ?No baseline GI symptoms.  ? ?Son with IBS. [place] with IBS in college presenting with diarrhea for a year. No known family history of colon cancer or polyps. Father with pancreatic cancer at age 6. No family history of uterine/endometrial cancer, pancreatic cancer or gastric/stomach cancer ? ? ?Past Medical History:  ?Diagnosis Date  ? Cancer [facility])   ? Pre cancer skin  ? Graves disease 09/2018  ? Heart murmur   ? ? ?Past Surgical History:  ?Procedure Laterality Date  ? COLONOSCOPY  05/08/2011  ? Dr.Brodie  ? DILATATION & CURETTAGE/HYSTEROSCOPY WITH MYOSURE N/A 01/01/2015  ? Procedure: DILATATION & CURETTAGE/HYSTEROSCOPY WITH MYOSURE;  Surgeon: Terrance Mass, MD;  Location: [facility];  Service: Gynecology;  Laterality: N/A;  ? pyloric stenosis  3223  ? 71 month old  ? ? ?Current Outpatient Medications  ?Medication Sig Dispense Refill  ? acyclovir (ZOVIRAX) 200 MG capsule TAKE 5 TIMES DAILY FOR 3 TO 5 DAYS AS NEEDED 90 capsule 0  ? ALPRAZolam (XANAX) 0.25 MG tablet TAKE 1 TABLET AT BEDTIME AS NEEDED FOR SLEEP 30 tablet 0  ? EFUDEX 5 % cream Apply topically.    ? methimazole (TAPAZOLE) 5 MG tablet Take by mouth 5 mg in a.m. and 2.5 mg in p.m. 90 tablet 1  ? Multiple Vitamin (MULTIVITAMIN PO) 1 tablet daily.    ? Na Sulfate-K Sulfate-Mg Sulf 17.5-3.13-1.6 GM/177ML SOLN Take 1 kit by mouth as directed. May use generic Suprep 354 mL 0  ? OVER THE COUNTER MEDICATION CBD gummy    ? VITAMIN D PO Take by mouth. (Patient not taking: Reported on 05/16/2021)    ?  Vitamin D, Ergocalciferol, (DRISDOL) 1.25 MG (50000 UNIT) CAPS capsule Take 1 capsule (50,000 Units total) by mouth every 7 (seven) days for 12 doses. 12 capsule 0  ? ?Current Facility-Administered Medications  ?Medication Dose Route Frequency Provider Last Rate Last Admin  ? 0.9 %  sodium chloride infusion  500 mL Intravenous Once [place], MD      ? ? ?Allergies as of 05/30/2021  ? (No Known Allergies)  ? ? ?Family History  ?Problem Relation Age of Onset  ? Hypertension Father   ? Cancer Father 21  ?     pancreatic  ? Cancer Sister   ? Depression Brother   ? Mental illness Brother   ? Colon cancer Paternal Uncle 16  ? Early death Maternal Grandmother   ? Hearing loss Maternal Grandfather   ? Early death Maternal Grandfather   ? Heart disease Maternal Grandfather   ? Hypertension Daughter   ? Kidney disease Son   ? Stomach cancer Neg Hx   ? Esophageal cancer Neg Hx   ? Colon polyps Neg Hx   ? ? ? ?Physical Exam: ?General:   Alert,  well-nourished, pleasant and cooperative in NAD ?Head:  Normocephalic and atraumatic. ?Eyes:  Sclera clear, no icterus.   Conjunctiva pink. ?Mouth:  No deformity or lesions.   ?Neck:  Supple; no masses or thyromegaly. ?Lungs:  Clear throughout to auscultation.   No wheezes. ?  Heart:  Regular rate and rhythm; no murmurs. ?Abdomen:  Soft, non-tender, nondistended, normal bowel sounds, no rebound or guarding.  ?Msk:  Symmetrical. No boney deformities ?LAD: No inguinal or umbilical LAD ?Extremities:  No clubbing or edema. ?Neurologic:  Alert and  oriented x4;  grossly nonfocal ?Skin:  No obvious rash or bruise. ?Psych:  Alert and cooperative. Normal mood and affect. ? ? ? ? ?Studies/Results: ?No results found. ? ? ? ?Landynn Dupler L. Tarri Glenn, MD, MPH ?05/30/2021, 8:29 AM ? ? ?  ?

## 2021-05-30 NOTE — Progress Notes (Signed)
Pt's states no medical or surgical changes since previsit or office visit. 

## 2021-05-30 NOTE — Progress Notes (Signed)
Called to room to assist during endoscopic procedure.  Patient ID and intended procedure confirmed with present staff. Received instructions for my participation in the procedure from the performing physician.  

## 2021-05-30 NOTE — Op Note (Signed)
[place] ?Patient Name: Sarah Jimenez ?Procedure Date: 05/30/2021 8:30 AM ?MRN: [id number] ?Endoscopist: Thornton Park MD, MD ?Age: 61 ?Referring MD:  ?Date of Birth: 04/11/1960 ?Gender: Female ?Account #: [id number] ?Procedure:                Colonoscopy ?Indications:              Screening for colorectal malignant neoplasm ?                          Normal colonoscopy with Dr. Olevia Perches in 2013 ?Medicines:                Monitored Anesthesia Care ?Procedure:                Pre-Anesthesia Assessment: ?                          - Prior to the procedure, a History and Physical  ?                          was performed, and patient medications and  ?                          allergies were reviewed. The patient's tolerance of  ?                          previous anesthesia was also reviewed. The risks  ?                          and benefits of the procedure and the sedation  ?                          options and risks were discussed with the patient.  ?                          All questions were answered, and informed consent  ?                          was obtained. Prior Anticoagulants: The patient has  ?                          taken no previous anticoagulant or antiplatelet  ?                          agents. ASA Grade Assessment: II - A patient with  ?                          mild systemic disease. After reviewing the risks  ?                          and benefits, the patient was deemed in  ?                          satisfactory condition to undergo the procedure. ?  After obtaining informed consent, the colonoscope  ?                          was passed under direct vision. Throughout the  ?                          procedure, the patient's blood pressure, pulse, and  ?                          oxygen saturations were monitored continuously. The  ?                          CF [id number] #[id number] was introduced through the anus  ?                          and advanced to the  3 cm into the ileum. A second  ?                          forward view of the right colon was performed. The  ?                          colonoscopy was performed without difficulty. The  ?                          patient tolerated the procedure well. The quality  ?                          of the bowel preparation was good. The terminal  ?                          ileum, ileocecal valve, appendiceal orifice, and  ?                          rectum were photographed. ?Scope In: 8:41:21 AM ?Scope Out: 8:53:12 AM ?Scope Withdrawal Time: 0 hours 9 minutes 8 seconds  ?Total Procedure Duration: 0 hours 11 minutes 51 seconds  ?Findings:                 The perianal and digital rectal examinations were  ?                          normal. ?                          A 2 mm polyp was found in the ascending colon. The  ?                          polyp was flat. The polyp was removed with a cold  ?                          snare. Resection and retrieval were complete.  ?                          Estimated blood loss was  minimal. ?                          The exam was otherwise without abnormality on  ?                          direct and retroflexion views. ?Complications:            No immediate complications. ?Estimated Blood Loss:     Estimated blood loss was minimal. ?Impression:               - One 2 mm polyp in the ascending colon, removed  ?                          with a cold snare. Resected and retrieved. ?                          - The examination was otherwise normal on direct  ?                          and retroflexion views. ?Recommendation:           - Patient has a contact number available for  ?                          emergencies. The signs and symptoms of potential  ?                          delayed complications were discussed with the  ?                          patient. Return to normal activities tomorrow.  ?                          Written discharge instructions were provided to the  ?                           patient. ?                          - Resume previous diet. ?                          - Continue present medications. ?                          - Await pathology results. ?                          - Repeat colonoscopy date to be determined after  ?                          pending pathology results are reviewed for  ?                          surveillance. ?                          -  Emerging evidence supports eating a diet of  ?                          fruits, vegetables, grains, calcium, and yogurt  ?                          while reducing red meat and alcohol may reduce the  ?                          risk of colon cancer. ?                          - Thank you for allowing me to be involved in your  ?                          colon cancer prevention. ?Thornton Park MD, MD ?05/30/2021 8:59:12 AM ?This report has been signed electronically. ?

## 2021-05-30 NOTE — Patient Instructions (Signed)
Handout on polyps given. ? ?Await pathology results. ? ?YOU HAD AN ENDOSCOPIC PROCEDURE TODAY AT THE [facility]:   Refer to the procedure report that was given to you for any specific questions about what was found during the examination.  If the procedure report does not answer your questions, please call your gastroenterologist to clarify.  If you requested that your care partner not be given the details of your procedure findings, then the procedure report has been included in a sealed envelope for you to review at your convenience later. ? ?YOU SHOULD EXPECT: Some feelings of bloating in the abdomen. Passage of more gas than usual.  Walking can help get rid of the air that was put into your GI tract during the procedure and reduce the bloating. If you had a lower endoscopy (such as a colonoscopy or flexible sigmoidoscopy) you may notice spotting of blood in your stool or on the toilet paper. If you underwent a bowel prep for your procedure, you may not have a normal bowel movement for a few days. ? ?Please Note:  You might notice some irritation and congestion in your nose or some drainage.  This is from the oxygen used during your procedure.  There is no need for concern and it should clear up in a day or so. ? ?SYMPTOMS TO REPORT IMMEDIATELY: ? ?Following lower endoscopy (colonoscopy or flexible sigmoidoscopy): ? Excessive amounts of blood in the stool ? Significant tenderness or worsening of abdominal pains ? Swelling of the abdomen that is new, acute ? Fever of 100?F or higher ?For urgent or emergent issues, a gastroenterologist can be reached at any hour by calling [phone]. ?Do not use MyChart messaging for urgent concerns.  ? ? ?DIET:  We do recommend a small meal at first, but then you may proceed to your regular diet.  Drink plenty of fluids but you should avoid alcoholic beverages for 24 hours. ? ?ACTIVITY:  You should plan to take it easy for the rest of today and you should NOT  DRIVE or use heavy machinery until tomorrow (because of the sedation medicines used during the test).   ? ?FOLLOW UP: ?Our staff will call the number listed on your records 48-72 hours following your procedure to check on you and address any questions or concerns that you may have regarding the information given to you following your procedure. If we do not reach you, we will leave a message.  We will attempt to reach you two times.  During this call, we will ask if you have developed any symptoms of COVID 19. If you develop any symptoms (ie: fever, flu-like symptoms, shortness of breath, cough etc.) before then, please call [phone].  If you test positive for Covid 19 in the 2 weeks post procedure, please call and report this information to us.   ? ?If any biopsies were taken you will be contacted by phone or by letter within the next 1-3 weeks.  Please call us at [phone] if you have not heard about the biopsies in 3 weeks.  ? ? ?SIGNATURES/CONFIDENTIALITY: ?You and/or your care partner have signed paperwork which will be entered into your electronic medical record.  These signatures attest to the fact that that the information above on your After Visit Summary has been reviewed and is understood.  Full responsibility of the confidentiality of this discharge information lies with you and/or your care-partner.  ?

## 2021-06-03 ENCOUNTER — Telehealth: Payer: Self-pay

## 2021-06-03 NOTE — Telephone Encounter (Signed)
Left message on answering machine. 

## 2021-06-04 ENCOUNTER — Encounter: Payer: Self-pay | Admitting: Gastroenterology

## 2021-06-18 ENCOUNTER — Ambulatory Visit (INDEPENDENT_AMBULATORY_CARE_PROVIDER_SITE_OTHER): Payer: BC Managed Care – PPO

## 2021-06-18 ENCOUNTER — Ambulatory Visit: Payer: BC Managed Care – PPO

## 2021-06-18 DIAGNOSIS — Z23 Encounter for immunization: Secondary | ICD-10-CM | POA: Diagnosis not present

## 2021-06-18 NOTE — Progress Notes (Signed)
Pt coming in today to receive 2nd Shingrix vaccine. Pt received IM injection in right Deltoid and tolerated well with no issues.

## 2021-06-21 ENCOUNTER — Other Ambulatory Visit: Payer: Self-pay | Admitting: Internal Medicine

## 2021-06-23 ENCOUNTER — Other Ambulatory Visit: Payer: Self-pay | Admitting: Nurse Practitioner

## 2021-06-23 DIAGNOSIS — E559 Vitamin D deficiency, unspecified: Secondary | ICD-10-CM

## 2021-07-31 ENCOUNTER — Ambulatory Visit: Payer: BC Managed Care – PPO | Admitting: Internal Medicine

## 2021-07-31 ENCOUNTER — Other Ambulatory Visit (INDEPENDENT_AMBULATORY_CARE_PROVIDER_SITE_OTHER): Payer: BC Managed Care – PPO

## 2021-07-31 DIAGNOSIS — E0500 Thyrotoxicosis with diffuse goiter without thyrotoxic crisis or storm: Secondary | ICD-10-CM | POA: Diagnosis not present

## 2021-07-31 LAB — T3, FREE: T3, Free: 4.3 pg/mL — ABNORMAL HIGH (ref 2.3–4.2)

## 2021-07-31 LAB — TSH: TSH: 0 u[IU]/mL — ABNORMAL LOW (ref 0.35–5.50)

## 2021-07-31 LAB — T4, FREE: Free T4: 1.28 ng/dL (ref 0.60–1.60)

## 2021-07-31 MED ORDER — METHIMAZOLE 5 MG PO TABS: ORAL_TABLET | ORAL | 1 refills | Status: DC

## 2021-08-22 ENCOUNTER — Encounter: Payer: Self-pay | Admitting: Internal Medicine

## 2021-08-22 ENCOUNTER — Other Ambulatory Visit: Payer: Self-pay

## 2021-08-22 DIAGNOSIS — E0500 Thyrotoxicosis with diffuse goiter without thyrotoxic crisis or storm: Secondary | ICD-10-CM

## 2021-08-22 MED ORDER — METHIMAZOLE 5 MG PO TABS: ORAL_TABLET | ORAL | 1 refills | Status: DC

## 2021-09-09 ENCOUNTER — Other Ambulatory Visit (INDEPENDENT_AMBULATORY_CARE_PROVIDER_SITE_OTHER): Payer: BC Managed Care – PPO

## 2021-09-09 DIAGNOSIS — E0500 Thyrotoxicosis with diffuse goiter without thyrotoxic crisis or storm: Secondary | ICD-10-CM | POA: Diagnosis not present

## 2021-09-09 LAB — TSH: TSH: 0.01 u[IU]/mL — ABNORMAL LOW (ref 0.35–5.50)

## 2021-09-09 LAB — T3, FREE: T3, Free: 3.3 pg/mL (ref 2.3–4.2)

## 2021-09-09 LAB — T4, FREE: Free T4: 0.87 ng/dL (ref 0.60–1.60)

## 2021-09-17 ENCOUNTER — Other Ambulatory Visit: Payer: Self-pay | Admitting: Nurse Practitioner

## 2021-09-17 DIAGNOSIS — B009 Herpesviral infection, unspecified: Secondary | ICD-10-CM

## 2021-09-17 DIAGNOSIS — F4322 Adjustment disorder with anxiety: Secondary | ICD-10-CM

## 2021-09-18 MED ORDER — ACYCLOVIR 200 MG PO CAPS: ORAL_CAPSULE | ORAL | 0 refills | Status: DC

## 2021-09-18 MED ORDER — ALPRAZOLAM 0.25 MG PO TABS: 0.2500 mg | ORAL_TABLET | Freq: Every evening | ORAL | 0 refills | Status: DC | PRN

## 2021-09-18 NOTE — Telephone Encounter (Signed)
Last AEX 04/02/2021

## 2021-10-20 ENCOUNTER — Encounter: Payer: Self-pay | Admitting: *Deleted

## 2021-11-03 ENCOUNTER — Ambulatory Visit (INDEPENDENT_AMBULATORY_CARE_PROVIDER_SITE_OTHER): Payer: BC Managed Care – PPO

## 2021-11-03 ENCOUNTER — Ambulatory Visit (INDEPENDENT_AMBULATORY_CARE_PROVIDER_SITE_OTHER): Payer: BC Managed Care – PPO | Admitting: Orthopaedic Surgery

## 2021-11-03 DIAGNOSIS — M25552 Pain in left hip: Secondary | ICD-10-CM

## 2021-11-03 DIAGNOSIS — M25551 Pain in right hip: Secondary | ICD-10-CM

## 2021-11-03 MED ORDER — TRIAMCINOLONE ACETONIDE 40 MG/ML IJ SUSP
80.0000 mg | INTRAMUSCULAR | Status: AC | PRN
  Administered 2021-11-03: 80 mg via INTRA_ARTICULAR

## 2021-11-03 MED ORDER — LIDOCAINE HCL 1 % IJ SOLN
4.0000 mL | INTRAMUSCULAR | Status: AC | PRN
  Administered 2021-11-03: 4 mL

## 2021-11-03 NOTE — Progress Notes (Signed)
Chief Complaint: Bilateral hip pain limited range of motion     History of Present Illness:    Sarah Jimenez is a 61 y.o. female presents today with bilateral hip pain and loss of motion.  She does have a history of Graves' disease for which she has had a frozen shoulders and both of her shoulders.  With regard to the right hip for 2 weeks she states that there has been loss of motion and pain around the groin area.  The left hip there is pain more about the anterior lateral hip.  Pain particular on the right is quite similar to how she felt with her frozen shoulder.  She is very active enjoys doing things like playing pickle ball.  This has been limited as a result of her hip pain.  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    Surgical History:   None  PMH/PSH/Family History/Social History/Meds/Allergies:    Past Medical History:  Diagnosis Date  . Cancer ([place])    Pre cancer skin  . Graves disease 09/2018  . Heart murmur    Past Surgical History:  Procedure Laterality Date  . COLONOSCOPY  05/08/2011   Dr.Brodie  . DILATATION & CURETTAGE/HYSTEROSCOPY WITH MYOSURE N/A 01/01/2015   Procedure: DILATATION & CURETTAGE/HYSTEROSCOPY WITH MYOSURE;  Surgeon: Terrance Mass, MD;  Location: [facility];  Service: Gynecology;  Laterality: N/A;  . pyloric stenosis  3748   41 month old   Social History   Socioeconomic History  . Marital status: Single    Spouse name: Not on file  . Number of children: Not on file  . Years of education: Not on file  . Highest education level: Not on file  Occupational History  . Not on file  Tobacco Use  . Smoking status: Never  . Smokeless tobacco: Never  Vaping Use  . Vaping Use: Never used  Substance and Sexual Activity  . Alcohol use: Yes    Alcohol/week: 4.0 - 5.0 standard drinks of alcohol    Types: 4 - 5 Glasses of wine per week  . Drug use: Yes    Types: Marijuana    Comment: THS Gummy  . Sexual  activity: Not Currently    Birth control/protection: Other-see comments    Comment: vasectomy, INTERCOURSE AGE 89, SEXUAL PARTNERS MORE THAN 5  Other Topics Concern  . Not on file  Social History Narrative  . Not on file   Social Determinants of Health   Financial Resource Strain: Not on file  Food Insecurity: Not on file  Transportation Needs: Not on file  Physical Activity: Not on file  Stress: Not on file  Social Connections: Not on file   Family History  Problem Relation Age of Onset  . Hypertension Father   . Cancer Father 56       pancreatic  . Cancer Sister   . Depression Brother   . Mental illness Brother   . Colon cancer Paternal Uncle 67  . Early death Maternal Grandmother   . Hearing loss Maternal Grandfather   . Early death Maternal Grandfather   . Heart disease Maternal Grandfather   . Hypertension Daughter   . Kidney disease Son   . Stomach cancer Neg Hx   . Esophageal cancer Neg Hx   . Colon polyps Neg Hx  No Known Allergies Current Outpatient Medications  Medication Sig Dispense Refill  . acyclovir (ZOVIRAX) 200 MG capsule TAKE 5 TIMES DAILY FOR 3 TO 5 DAYS AS NEEDED 90 capsule 0  . ALPRAZolam (XANAX) 0.25 MG tablet Take 1 tablet (0.25 mg total) by mouth at bedtime as needed. for sleep 30 tablet 0  . EFUDEX 5 % cream Apply topically.    . methimazole (TAPAZOLE) 5 MG tablet TAKE by mouth 10 mg in a.m. and 5 mg in p.m. 180 tablet 1  . Multiple Vitamin (MULTIVITAMIN PO) 1 tablet daily.    Marland Kitchen OVER THE COUNTER MEDICATION CBD gummy     No current facility-administered medications for this visit.   No results found.  Review of Systems:   A ROS was performed including pertinent positives and negatives as documented in the HPI.  Physical Exam :   Constitutional: NAD and appears stated age Neurological: Alert and oriented Psych: Appropriate affect and cooperative Last menstrual period 10/30/2014.   Comprehensive Musculoskeletal Exam:    Inspection  Right Left  Skin No atrophy or gross abnormalities appreciated No atrophy or gross abnormalities appreciated  Palpation    Tenderness Femoral acetabular Femoral acetabular  Crepitus None None  Range of Motion    Flexion (passive) 120 120  Extension 30 30  IR 20 30 with pain  ER 31 45  Strength    Flexion  5/5 5/5  Extension 5/5 5/5  Special Tests    FABER Negative Negative  FADIR Negative Negative  ER Lag/Capsular Insufficiency Negative Negative  Instability Negative Negative  Sacroiliac pain Negative  Negative   Instability    Generalized Laxity No No  Neurologic    sciatic, femoral, obturator nerves intact to light sensation  Vascular/Lymphatic    DP pulse 2+ 2+  Lumbar Exam    Patient has symmetric lumbar range of motion with negative pain referral to hip     Imaging:   Xray (AP pelvis, 3 views right hip, 3 views left hip): Normal    I personally reviewed and interpreted the radiographs.   Assessment:   61 y.o. female with bilateral hip pain which is consistent possibly with capsulitis in the setting of Graves' disease.  I did describe that ultimately I do believe that she would benefit from ultrasound-guided intra-articular injections of both hips.  I did specifically give her a series of stretching exercises from YouTube that she can follow and work on.  She will plan to do this 2 days after the injections.  Plan :    -Return to clinic as needed    Procedure Note  Patient: Sarah Jimenez             Date of Birth: 1960/06/13           MRN: [id number]             Visit Date: 11/03/2021  Procedures: Visit Diagnoses:  1. Bilateral hip pain     Large Joint Inj: R hip joint on 11/03/2021 10:33 AM Indications: pain Details: 22 G 3.5 in needle, ultrasound-guided anterolateral approach  Arthrogram: No  Medications: 4 mL lidocaine 1 %; 80 mg triamcinolone acetonide 40 MG/ML Outcome: tolerated well, no immediate complications Procedure, treatment  alternatives, risks and benefits explained, specific risks discussed. Consent was given by the patient. Immediately prior to procedure a time out was called to verify the correct patient, procedure, equipment, support staff and site/side marked as required. Patient was prepped and draped in the usual sterile  fashion.          I personally saw and evaluated the patient, and participated in the management and treatment plan.  Vanetta Mulders, MD Attending Physician, Orthopedic Surgery  This document was dictated using Dragon voice recognition software. A reasonable attempt at proof reading has been made to minimize errors.

## 2021-11-03 NOTE — Progress Notes (Signed)
   Procedure Note  Patient: Sarah Jimenez             Date of Birth: Mar 25, 1960           MRN: [id number]             Visit Date: 11/03/2021  Procedures: Visit Diagnoses:  1. Bilateral hip pain     Large Joint Inj: L hip joint on 11/03/2021 10:34 AM Indications: pain Details: 22 G 3.5 in needle, ultrasound-guided anterolateral approach  Arthrogram: No  Medications: 4 mL lidocaine 1 %; 80 mg triamcinolone acetonide 40 MG/ML Outcome: tolerated well, no immediate complications Procedure, treatment alternatives, risks and benefits explained, specific risks discussed. Consent was given by the patient. Immediately prior to procedure a time out was called to verify the correct patient, procedure, equipment, support staff and site/side marked as required. Patient was prepped and draped in the usual sterile fashion.

## 2021-11-06 ENCOUNTER — Ambulatory Visit: Payer: BC Managed Care – PPO | Admitting: Orthopedic Surgery

## 2021-11-11 ENCOUNTER — Other Ambulatory Visit: Payer: BC Managed Care – PPO

## 2021-11-20 ENCOUNTER — Other Ambulatory Visit (INDEPENDENT_AMBULATORY_CARE_PROVIDER_SITE_OTHER): Payer: BC Managed Care – PPO

## 2021-11-20 ENCOUNTER — Other Ambulatory Visit: Payer: Self-pay | Admitting: Internal Medicine

## 2021-11-20 ENCOUNTER — Ambulatory Visit (INDEPENDENT_AMBULATORY_CARE_PROVIDER_SITE_OTHER): Payer: BC Managed Care – PPO

## 2021-11-20 DIAGNOSIS — E0500 Thyrotoxicosis with diffuse goiter without thyrotoxic crisis or storm: Secondary | ICD-10-CM

## 2021-11-20 DIAGNOSIS — Z23 Encounter for immunization: Secondary | ICD-10-CM

## 2021-11-20 LAB — TSH: TSH: 1.98 u[IU]/mL (ref 0.35–5.50)

## 2021-11-20 LAB — T4, FREE: Free T4: 0.66 ng/dL (ref 0.60–1.60)

## 2021-11-20 LAB — T3, FREE: T3, Free: 3.6 pg/mL (ref 2.3–4.2)

## 2021-12-26 ENCOUNTER — Encounter: Payer: Self-pay | Admitting: Internal Medicine

## 2021-12-26 ENCOUNTER — Ambulatory Visit (INDEPENDENT_AMBULATORY_CARE_PROVIDER_SITE_OTHER): Payer: BC Managed Care – PPO | Admitting: Internal Medicine

## 2021-12-26 VITALS — BP 128/73 | HR 66 | Temp 98.1°F | Ht 62.0 in | Wt 145.2 lb

## 2021-12-26 DIAGNOSIS — R5383 Other fatigue: Secondary | ICD-10-CM

## 2021-12-26 DIAGNOSIS — R61 Generalized hyperhidrosis: Secondary | ICD-10-CM | POA: Diagnosis not present

## 2021-12-26 DIAGNOSIS — R6889 Other general symptoms and signs: Secondary | ICD-10-CM

## 2021-12-26 LAB — POCT INFLUENZA A/B
Influenza A, POC: NEGATIVE
Influenza B, POC: NEGATIVE

## 2021-12-26 NOTE — Progress Notes (Signed)
Acute Office Visit  Subjective:     Patient ID: Sarah Jimenez, female    DOB: November 03, 1960, 61 y.o.   MRN: [id number]  Chief Complaint  Patient presents with   Excessive Sweating    At night, alternate taking Advil and Tylenol. Also Nyquil with some relief.   Fatigue    The last few days with no appetite. Had a cold after Thanksgiving.   Wheezing    Started last Saturday.   Rattled breathing   Negative COVID test    Sunday.    Wheezing    Patient is in today for getting sick since Sunday 6 days ago 10-12 people in her travel party to nursing home last week Really bad colds with sneezing coughing and sweating but everyone else got better Feels so exhausted can hardly get off couch Sweating a lot- clothes soaked overnight Bit of cough with thinks she hears wheezing with inhale Poor appetite The exhaustion is since yesterday and a little the day before Not a lot of nasal drainage Never felt like she couldn't breath Previously had feeling it was in her chest and hurt to cough but even that's gone now.  Her teeth hurt Had a sore throat but its gone No ear symptom(s) Most bothersome symptom(s) are low appetite, exhaustion, sweating, with a little less concerning because it is mild cough.  She feels like she could fall asleep right now  Had flu/COVID shots 2-3 weeks ago      Objective:    BP 128/73 (BP Location: Left Arm, Patient Position: Sitting)   Pulse 66   Temp 98.1 F (36.7 C) (Temporal)   Ht '5\' 2"'$  (1.575 m)   Wt 145 lb 3.2 oz (65.9 kg)   LMP 10/30/2014   SpO2 97%   BMI 26.56 kg/m     Physical Exam Vitals and nursing note reviewed.    General: Appearance:    Appears fatigued and as if she doesn't feel well Well developed, well nourished female in no acute distress  Eyes:    PERRL, conjunctiva/corneas clear,     Lungs:     Clear to auscultation bilaterally, respirations unlabored  Heart:    Normal heart rate. Normal rhythm. No murmurs, rubs, or  gallops.   MS:   All extremities are intact.   Neurologic:   Awake, alert, oriented x 3. No apparent focal neurological           defect.     Her throat looks fine she is got red nasal mucosa there is no cervical lymphadenopathy cough is frequent and sounds a little bit wheezy and rattly and throaty    Assessment & Plan:   Almost certainly an infection considering her severe symptoms of exhaustion and hypersomnolence and she is on a high deductible savings plan so decided she would do her COVID test at home and let us know.  Flu test here was negative.  We offered viral panel but it does not make sense financially for her or for almost anyone.  Due to the possibility of pneumonia or bacterial sinusitis I sent in azithromycin in case COVID test is negative and she is getting high fevers or coughing up goop.   Mono and RSV are also possibilities.  I advised as long as she stays hydrated and breathes well - won't need ER   Problem List Items Addressed This Visit       Active Problems   Flu-like symptoms   Other Visit Diagnoses  Excessive sweating    -  Primary   Relevant Orders   POCT Influenza A/B (Completed)   Fatigue, unspecified type       Relevant Orders   POCT Influenza A/B (Completed)      Results for orders placed or performed in visit on 12/26/21  POCT Influenza A/B  Result Value Ref Range   Influenza A, POC Negative Negative   Influenza B, POC Negative Negative     Loralee Pacas, MD

## 2022-01-05 ENCOUNTER — Ambulatory Visit (INDEPENDENT_AMBULATORY_CARE_PROVIDER_SITE_OTHER): Payer: BC Managed Care – PPO | Admitting: Internal Medicine

## 2022-01-05 ENCOUNTER — Encounter: Payer: Self-pay | Admitting: Internal Medicine

## 2022-01-05 VITALS — BP 108/70 | HR 64 | Ht 62.0 in | Wt 148.4 lb

## 2022-01-05 DIAGNOSIS — R635 Abnormal weight gain: Secondary | ICD-10-CM

## 2022-01-05 DIAGNOSIS — E0500 Thyrotoxicosis with diffuse goiter without thyrotoxic crisis or storm: Secondary | ICD-10-CM | POA: Diagnosis not present

## 2022-01-05 LAB — TSH: TSH: 7.09 u[IU]/mL — ABNORMAL HIGH (ref 0.35–5.50)

## 2022-01-05 LAB — T4, FREE: Free T4: 0.59 ng/dL — ABNORMAL LOW (ref 0.60–1.60)

## 2022-01-05 LAB — T3, FREE: T3, Free: 3.2 pg/mL (ref 2.3–4.2)

## 2022-01-05 MED ORDER — METHIMAZOLE 5 MG PO TABS: ORAL_TABLET | ORAL | 1 refills | Status: DC

## 2022-01-05 NOTE — Patient Instructions (Addendum)
Please stop at the lab.  Please continue Methimazole 5 mg in am and 5 mg in pm for now.  Please come back for a follow-up appointment in 1 year but labs at 6 months.

## 2022-01-05 NOTE — Progress Notes (Signed)
Patient ID: Sarah Jimenez, female   DOB: [birth year], 61 y.o.   MRN: [id number]   HPI  Sarah Jimenez is a 61 y.o.-year-old female, initially referred by her gastroenterologist, Dr. Tarri Glenn, returning for follow-up for Graves' disease. Last visit 1 year ago.  Interim history: She denies tremors, palpitations, insomnia, anxiety.  She feels fatigued and slow. She has decreased appetite. Food tastes different: she does not like coffee and wine anymore.  Reviewed history: Patient was found to have an undetectable TSH during investigation of diarrhea, which started in 04/2017.  Previous TSH levels from 2017 and 2014 were normal.  Reviewed her TFTs: Lab Results  Component Value Date   TSH 1.98 11/20/2021   TSH 0.01 (L) 09/09/2021   TSH 0.00 Repeated and verified X2. (L) 07/31/2021   TSH 0.04 (L) 02/26/2021   TSH 0.04 (L) 01/06/2021   TSH 1.50 07/17/2020   TSH 0.01 (L) 03/06/2020   TSH <0.01 Repeated and verified X2. (L) 01/31/2020   TSH <0.01 (L) 12/20/2019   TSH <0.01 (L) 11/08/2019   FREET4 0.66 11/20/2021   FREET4 0.87 09/09/2021   FREET4 1.28 07/31/2021   FREET4 0.89 02/26/2021   FREET4 1.06 01/06/2021   FREET4 0.66 07/17/2020   FREET4 0.83 03/06/2020   FREET4 0.90 01/31/2020   FREET4 1.14 12/20/2019   FREET4 1.86 (H) 11/08/2019   T3FREE 3.6 11/20/2021   T3FREE 3.3 09/09/2021   T3FREE 4.3 (H) 07/31/2021   T3FREE 3.8 02/26/2021   T3FREE 4.8 (H) 01/06/2021   T3FREE 3.5 07/17/2020   T3FREE 3.7 03/06/2020   T3FREE 3.9 01/31/2020   T3FREE 4.3 (H) 12/20/2019   T3FREE 7.1 (H) 11/08/2019   Her TSI antibodies were not elevated: Lab Results  Component Value Date   TSI 112 08/16/2018   Thyroid uptake and scan (10/13/2018): Uptake at 4 hours 29.4%, uptake in 24 hours, 42.5%, both elevated.  Uniform scan, consistent with Graves' disease.  08/2018: We started methimazole (MMI), initially 5 mg daily. 05/2018: MMI increase to 5 mg 2x a day 10/2018: MMI decrease to 5 mg in a.m.  and 2.5 mg in p.m. 11/2018: She increased MMI dose to 5 mg 2x a day 03/2019: MMI decreased to 5 mg daily 05/2019: MMI decreased to 2.5 mg daily 08/2019: she stopped MMI by herself 10/2019: MMI restarted at 5 mg daily 12/2020: MMI increase to 7.5 mg daily 07/2021: MMI increased to 5 mg 2x aday (could not tolerated 10 mg in am and 5 mg in pm - AP)  Pt denies: - feeling nodules in neck - hoarseness - dysphagia - choking  She initially had the following symptoms: Diarrhea, which improved after changing her diet and cutting out meat and dairy.   Occasional palpitations Fatigue Insomnia Tremors The symptoms have resolved except for fatigue.  No FH of thyroid disease or thyroid cancer. No h/o radiation tx to head or neck. No Biotin use. No recent steroids use.   + CBD gummieat night - helps her sleep. Of note, a DXA scan was normal in 2017. 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 She also has a history of frozen shoulders.  ROS: + see HPI  I reviewed pt's medications, allergies, PMH, social hx, family hx, and changes were documented in the history of present illness. Otherwise, unchanged from my initial visit note.  Past Medical History:  Diagnosis Date   Cancer [facility])    Pre cancer skin   Graves disease 09/2018   Heart murmur  Past Surgical History:  Procedure Laterality Date   COLONOSCOPY  05/08/2011   Dr.Brodie   DILATATION & CURETTAGE/HYSTEROSCOPY WITH MYOSURE N/A 01/01/2015   Procedure: DILATATION & CURETTAGE/HYSTEROSCOPY WITH MYOSURE;  Surgeon: Terrance Mass, MD;  Location: [facility];  Service: Gynecology;  Laterality: N/A;   pyloric stenosis  6072   77 month old   Social History   Socioeconomic History   Marital status: Single    Spouse name: Not on file   Number of children: 4   Years of education: Not on file   Highest education level: Not on file  Occupational History    Communication coordinator  Tobacco Use   Smoking status: Never Smoker    Smokeless tobacco: Never Used  Substance and Sexual Activity   Alcohol use: Yes    Alcohol/week: 2.0 standard drinks    Types: 2 Glasses of wine per week    Comment: LITTLE   Drug use: Yes    Comment: Edible Marijuana tried while in [place]   Sexual activity: Not Currently    Birth control/protection: Other-see comments    Comment: vasectomy, INTERCOURSE AGE 42, SEXUAL PARTNERS MORE THAN 5   Current Outpatient Medications on File Prior to Visit  Medication Sig Dispense Refill   acyclovir (ZOVIRAX) 200 MG capsule TAKE 5 TIMES DAILY FOR 3 TO 5 DAYS AS NEEDED 90 capsule 0   ALPRAZolam (XANAX) 0.25 MG tablet Take 1 tablet (0.25 mg total) by mouth at bedtime as needed. for sleep 30 tablet 0   EFUDEX 5 % cream Apply topically. In the fall.     methimazole (TAPAZOLE) 5 MG tablet TAKE 2 TABLETS EVERY MORNING AND 1 TABLET EVERY EVENING 270 tablet 1   Multiple Vitamin (MULTIVITAMIN PO) 1 tablet daily.     OVER THE COUNTER MEDICATION CBD gummy     No current facility-administered medications on file prior to visit.   No Known Allergies Family History  Problem Relation Age of Onset   Hypertension Father    Cancer Father 7       pancreatic   Cancer Sister    Depression Brother    Mental illness Brother    Colon cancer Paternal Uncle 56   Early death Maternal Grandmother    Hearing loss Maternal Grandfather    Early death Maternal Grandfather    Heart disease Maternal Grandfather    Hypertension Daughter    Kidney disease Son    Stomach cancer Neg Hx    Esophageal cancer Neg Hx    Colon polyps Neg Hx     PE: BP 108/70 (BP Location: Right Arm, Patient Position: Sitting, Cuff Size: Normal)   Pulse 64   Ht '5\' 2"'$  (1.575 m)   Wt 148 lb 6.4 oz (67.3 kg)   LMP 10/30/2014   SpO2 98%   BMI 27.14 kg/m  Wt Readings from Last 3 Encounters:  01/05/22 148 lb 6.4 oz (67.3 kg)  12/26/21 145 lb 3.2 oz (65.9 kg)  05/30/21 149 lb (67.6 kg)   Constitutional: normal weight, in NAD Eyes:   EOMI, no exophthalmos ENT: no neck masses, no cervical lymphadenopathy Cardiovascular: RRR, No MRG Respiratory: CTA B Musculoskeletal: no deformities Skin:no rashes Neurological: no tremor with outstretched hands  ASSESSMENT: 1.  Graves' disease  2. Weight gain  PLAN:  1. Patient with history of Graves' disease, diagnosed after being found to be thyrotoxic and symptomatic with diarrhea, palpitations, without anxiety, heat intolerance, or significant weight loss.  She was started on methimazole and  the dose was subsequently adjusted.   -She does describe taste changes and she is wondering whether these are related to methimazole.  This is not a known side effect of the medication. -At last visit, she was on 5 mg methimazole daily, but we had to increase the dose to 7.5 mg daily.  Since then, I advised her to increase the dose even further, to 10 mg in a.m. and 5 mg p.m. she tells me that she was not able to tolerate this due to abdominal pain and she continues 5 mg twice a day. -She tolerates this dose of methimazole well, without side effects -At today's visit, she does not have thyrotoxic signs or symptoms and no signs of active Graves' ophthalmopathy: No blurry vision, double vision, eye pain, chemosis -Reviewed latest TFTs from 11/20/2021 and they were normal -We again discussed about definitive treatment for Graves' disease to include RAI treatment or surgery; however, for now, she would like to continue to try methimazole.  If she has another recurrence, she is not completely against RAI treatment. -We will recheck her TFTs today and adjust the dose of methimazole accordingly -I will see her back in 1 year, but labs at 6 months  2.  Weight gain -She was able to lose approximately 30 pounds on a more plant-based diet in the past, but then switched while on the first diet and gained weight. -She gained 7 pounds before last visit -We discussed that this is not necessarily a side effect  of methimazole but it could have been an effect of Graves' disease resolution -I explained that the slowing down metabolism can occur with any Graves' disease treatment  -She recently lost 4 pounds and gained 3 back  Component     Latest Ref Rng 01/05/2022  TSH     0.35 - 5.50 uIU/mL 7.09 (H)   Triiodothyronine,Free,Serum     2.3 - 4.2 pg/mL 3.2   T4,Free(Direct)     0.60 - 1.60 ng/dL 0.59 (L)   TSH is elevated and free T4 is slightly low.  Will back off the methimazole to 5 mg daily and recheck her TFTs in 5 weeks.  Philemon Kingdom, MD PhD [facility]

## 2022-02-11 ENCOUNTER — Other Ambulatory Visit (INDEPENDENT_AMBULATORY_CARE_PROVIDER_SITE_OTHER): Payer: BC Managed Care – PPO

## 2022-02-11 DIAGNOSIS — E0500 Thyrotoxicosis with diffuse goiter without thyrotoxic crisis or storm: Secondary | ICD-10-CM | POA: Diagnosis not present

## 2022-02-11 LAB — T3, FREE: T3, Free: 3.5 pg/mL (ref 2.3–4.2)

## 2022-02-11 LAB — T4, FREE: Free T4: 0.77 ng/dL (ref 0.60–1.60)

## 2022-02-11 LAB — TSH: TSH: 2.83 u[IU]/mL (ref 0.35–5.50)

## 2022-02-27 ENCOUNTER — Other Ambulatory Visit: Payer: Self-pay | Admitting: Nurse Practitioner

## 2022-02-27 DIAGNOSIS — F4322 Adjustment disorder with anxiety: Secondary | ICD-10-CM

## 2022-03-02 NOTE — Telephone Encounter (Signed)
AEX 04/02/2021. Scheduled AEX 04/13/2022.

## 2022-03-03 MED ORDER — ALPRAZOLAM 0.25 MG PO TABS: 0.2500 mg | ORAL_TABLET | Freq: Every evening | ORAL | 0 refills | Status: DC | PRN

## 2022-03-23 ENCOUNTER — Ambulatory Visit (INDEPENDENT_AMBULATORY_CARE_PROVIDER_SITE_OTHER): Payer: BC Managed Care – PPO | Admitting: Family Medicine

## 2022-03-23 ENCOUNTER — Encounter: Payer: Self-pay | Admitting: Family Medicine

## 2022-03-23 VITALS — BP 114/60 | HR 69 | Temp 98.0°F | Ht 62.0 in | Wt 149.4 lb

## 2022-03-23 DIAGNOSIS — Z23 Encounter for immunization: Secondary | ICD-10-CM

## 2022-03-23 DIAGNOSIS — Z0000 Encounter for general adult medical examination without abnormal findings: Secondary | ICD-10-CM

## 2022-03-23 NOTE — Patient Instructions (Signed)
It was very nice to see you today!  Keep active   PLEASE NOTE:  If you had any lab tests please let us know if you have not heard back within a few days. You may see your results on MyChart before we have a chance to review them but we will give you a call once they are reviewed by [place]. If we ordered any referrals today, please let us know if you have not heard from their office within the next week.   Please try these tips to maintain a healthy lifestyle:  Eat most of your calories during the day when you are active. Eliminate processed foods including packaged sweets (pies, cakes, cookies), reduce intake of potatoes, white bread, white pasta, and white rice. Look for whole grain options, oat flour or almond flour.  Each meal should contain half fruits/vegetables, one quarter protein, and one quarter carbs (no bigger than a computer mouse).  Cut down on sweet beverages. This includes juice, soda, and sweet tea. Also watch fruit intake, though this is a healthier sweet option, it still contains natural sugar! Limit to 3 servings daily.  Drink at least 1 glass of water with each meal and aim for at least 8 glasses per day  Exercise at least 150 minutes every week.

## 2022-03-23 NOTE — Progress Notes (Signed)
Phone [phone]   Subjective:   Patient is a 62 y.o. female presenting for annual physical.    Chief Complaint  Patient presents with   Annual Exam    Pt here for Annual exam and is not currentlt fasting    Annual-exercises.  Labs thru gyn [place]are granuloma rashes. - better since on methimazole, but joint pain worse  See problem oriented charting- ROS- ROS: Gen: no fever, chills  Skin: no rash, itching ENT: no ear pain, ear drainage, nasal congestion, rhinorrhea, sinus pressure, sore throat Eyes: no blurry vision, double vision Resp: no cough, wheeze,SOB CV: no CP, palpitations, LE edema,  GI: no heartburn, n/v/d/c, abd pain GU: no dysuria, urgency, frequency, hematuria [id number] hips and shoulders-saw orth.  ?autoimmune.  "Lame" when exercises. Some morning stiffness.  No swollen joints Neuro: no dizziness, headache, weakness, vertigo Psych: no depression, anxiety, insomnia, SI   The following were reviewed and entered/updated in epic: Past Medical History:  Diagnosis Date   Cancer ([place])    Pre cancer skin   Graves disease 09/2018   Heart murmur    Patient Active Problem List   Diagnosis Date Noted   Flu-like symptoms 12/26/2021   Graves disease 03/29/2019   HSV infection 07/03/2011   Past Surgical History:  Procedure Laterality Date   COLONOSCOPY  05/08/2011   Dr.Brodie   DILATATION & CURETTAGE/HYSTEROSCOPY WITH MYOSURE N/A 01/01/2015   Procedure: DILATATION & CURETTAGE/HYSTEROSCOPY WITH MYOSURE;  Surgeon: Terrance Mass, MD;  Location: [facility];  Service: Gynecology;  Laterality: N/A;   pyloric stenosis  291   30 month old    Family History  Problem Relation Age of Onset   Hypertension Father    Cancer Father 66       pancreatic   Cancer Sister    Depression Brother    Mental illness Brother    Colon cancer Paternal Uncle 50   Early death Maternal Grandmother    Hearing loss Maternal Grandfather    Early death Maternal Grandfather    Heart  disease Maternal Grandfather    Hypertension Daughter    Kidney disease Son    Stomach cancer Neg Hx    Esophageal cancer Neg Hx    Colon polyps Neg Hx     Medications- reviewed and updated Current Outpatient Medications  Medication Sig Dispense Refill   acyclovir (ZOVIRAX) 200 MG capsule TAKE 5 TIMES DAILY FOR 3 TO 5 DAYS AS NEEDED 90 capsule 0   ALPRAZolam (XANAX) 0.25 MG tablet Take 1 tablet (0.25 mg total) by mouth at bedtime as needed. for sleep 30 tablet 0   EFUDEX 5 % cream Apply topically. In the fall.     methimazole (TAPAZOLE) 5 MG tablet TAKE 1 TABLET BY MOUTH EVERY MORNING 90 tablet 1   Multiple Vitamin (MULTIVITAMIN PO) 1 tablet daily.     OVER THE COUNTER MEDICATION CBD gummy     No current facility-administered medications for this visit.    Allergies-reviewed and updated No Known Allergies  Social History   Social History Narrative   Not on file   Objective  Objective:  BP 114/60   Pulse 69   Temp 98 F (36.7 C)   Ht '5\' 2"'$  (1.575 m)   Wt 149 lb 6.4 oz (67.8 kg)   LMP 10/30/2014   SpO2 96%   BMI 27.33 kg/m  Physical Exam  Gen: WDWN NAD HEENT: NCAT, conjunctiva not injected, sclera nonicteric TM WNL B, OP moist, no exudates  NECK:  supple, no thyromegaly, no nodes, no carotid bruits CARDIAC: RRR, S1S2+, no murmur. DP 2+B LUNGS: CTAB. No wheezes ABDOMEN:  BS+, soft, NTND, No HSM, no masses EXT:  no edema MSK: no gross abnormalities. MS 5/5 all 4 NEURO: A&O x3.  CN II-XII intact.  PSYCH: normal mood. Good eye contact    Assessment and Plan   Health Maintenance counseling: 1. Anticipatory guidance: Patient counseled regarding regular dental exams q6 months, eye exams,  avoiding smoking and second hand smoke, limiting alcohol to 1 beverage per day, no illicit drugs.   2. Risk factor reduction:  Advised patient of need for regular exercise and diet rich and fruits and vegetables to reduce risk of heart attack and stroke. Exercise- +.  Wt Readings  from Last 3 Encounters:  03/23/22 149 lb 6.4 oz (67.8 kg)  01/05/22 148 lb 6.4 oz (67.3 kg)  12/26/21 145 lb 3.2 oz (65.9 kg)   3. Immunizations/screenings/ancillary studies Immunization History  Administered Date(s) Administered   Influenza Inj Mdck Quad Pf 11/08/2016   Influenza,inj,Quad PF,6+ Mos 11/24/2017, 11/29/2018, 12/20/2019, 11/20/2021   Influenza-Unspecified 01/21/2010, 01/07/2011, 02/10/2012, 10/15/2020   PFIZER(Purple Top)SARS-COV-2 Vaccination 03/25/2019, 04/15/2019, 12/29/2019   Pfizer Covid-19 Vaccine Bivalent Booster 57yr & up 10/15/2020   Td 01/21/2010, 07/17/2011   Tdap 01/21/2010, 07/17/2011, 03/23/2022   Zoster Recombinat (Shingrix) 03/20/2021, 06/18/2021   There are no preventive care reminders to display for this patient.   4. Cervical cancer screening- gyn 5. Breast cancer screening-  mammogram gyn 6. Colon cancer screening - utd 7. Skin cancer screening- advised regular sunscreen use. Denies worrisome, changing, or new skin lesions.  8. Birth control/STD check- n/a 9. Osteoporosis screening- utd 10. Smoking associated screening - non smoker  Problem List Items Addressed This Visit   None Visit Diagnoses     Wellness examination    -  Primary   Need for Tdap vaccination       Relevant Orders   Tdap vaccine greater than or equal to 7yo IM (Completed)      Annual-Tdap given.  Antic guidance.  Stretches, etc for hips.    Recommended follow up: annual Return in about 1 year (around 03/24/2023) for annual. Future Appointments  Date Time Provider DBastrop 04/20/2022 11:30 AM WMarny LowensteinA, NP [facility] None  07/07/2022  8:00 AM [facility]NEURO [facility] None  01/06/2023  8:00 AM GPhilemon Kingdom MD [facility] None    Lab/Order associations:n/a fasting   ICD-10-CM   1. Wellness examination  Z00.00     2. Need for Tdap vaccination  Z23 Tdap vaccine greater than or equal to 7yo IM      No orders of the defined types were placed  in this encounter.   AWellington Hampshire MD

## 2022-04-07 DIAGNOSIS — Z85828 Personal history of other malignant neoplasm of skin: Secondary | ICD-10-CM | POA: Diagnosis not present

## 2022-04-07 DIAGNOSIS — D2272 Melanocytic nevi of left lower limb, including hip: Secondary | ICD-10-CM | POA: Diagnosis not present

## 2022-04-07 DIAGNOSIS — D225 Melanocytic nevi of trunk: Secondary | ICD-10-CM | POA: Diagnosis not present

## 2022-04-07 DIAGNOSIS — L570 Actinic keratosis: Secondary | ICD-10-CM | POA: Diagnosis not present

## 2022-04-08 ENCOUNTER — Other Ambulatory Visit: Payer: Self-pay | Admitting: Family Medicine

## 2022-04-08 DIAGNOSIS — Z1231 Encounter for screening mammogram for malignant neoplasm of breast: Secondary | ICD-10-CM

## 2022-04-13 ENCOUNTER — Ambulatory Visit: Payer: BC Managed Care – PPO | Admitting: Nurse Practitioner

## 2022-04-20 ENCOUNTER — Encounter: Payer: Self-pay | Admitting: Nurse Practitioner

## 2022-04-20 ENCOUNTER — Ambulatory Visit (INDEPENDENT_AMBULATORY_CARE_PROVIDER_SITE_OTHER): Payer: BC Managed Care – PPO | Admitting: Nurse Practitioner

## 2022-04-20 VITALS — BP 110/76 | HR 79 | Ht 62.5 in | Wt 149.0 lb

## 2022-04-20 DIAGNOSIS — B009 Herpesviral infection, unspecified: Secondary | ICD-10-CM | POA: Diagnosis not present

## 2022-04-20 DIAGNOSIS — Z780 Asymptomatic menopausal state: Secondary | ICD-10-CM | POA: Diagnosis not present

## 2022-04-20 DIAGNOSIS — E7800 Pure hypercholesterolemia, unspecified: Secondary | ICD-10-CM | POA: Diagnosis not present

## 2022-04-20 DIAGNOSIS — E559 Vitamin D deficiency, unspecified: Secondary | ICD-10-CM

## 2022-04-20 DIAGNOSIS — Z01419 Encounter for gynecological examination (general) (routine) without abnormal findings: Secondary | ICD-10-CM

## 2022-04-20 NOTE — Progress Notes (Signed)
   Sarah Jimenez 11/26/1960 [id number]   History:  62 y.o. G4 P4 presents for annual exam without GYN complaints. Postmenopausal - no HRT, no bleeding. 2017 endometrial polyp removed. Normal pap and mammogram history. HSV,  Acyclovir for outbreaks. Xanax occasionally for insomnia and anxiety. History of Graves disease, followed by endocrinology, on Methimazole.   Gynecologic History Patient's last menstrual period was 10/30/2014.   Contraception: post menopausal status Sexually active: No  Health maintenance Last Pap: 04/02/2021. Results were: Normal neg HPV, 5-year repeat Last mammogram: 04/29/2021. Results were: Normal Last colonoscopy: 05/30/2021. Results were: Tubular adenoma, 8-year recall Last Dexa: 05/21/2021. Results were: Normal  Past medical history, past surgical history, family history and social history were all reviewed and documented in the EPIC chart. Single. Works remote in [employer], [employer]. 4 children (2 sons, 2 daughters) ages 64-31.   ROS:  A ROS was performed and pertinent positives and negatives are included.  Exam:  Vitals:   04/20/22 1132  BP: 110/76  Pulse: 79  SpO2: 100%  Weight: 149 lb (67.6 kg)  Height: 5' 2.5" (1.588 m)     Body mass index is 26.82 kg/m.  General appearance:  Normal Thyroid:  Symmetrical, normal in size, without palpable masses or nodularity. Respiratory  Auscultation:  Clear without wheezing or rhonchi Cardiovascular  Auscultation:  Regular rate, without rubs, murmurs or gallops  Edema/varicosities:  Not grossly evident Abdominal  Soft,nontender, without masses, guarding or rebound.  Liver/spleen:  No organomegaly noted  Hernia:  None appreciated  Skin  Inspection:  Grossly normal   Breasts: Examined lying and sitting.   Right: Without masses, retractions, discharge or axillary adenopathy.   Left: Without masses, retractions, discharge or axillary adenopathy. Genitourinary   Inguinal/mons:  Normal without inguinal  adenopathy  External genitalia:  Normal appearing vulva with no masses, tenderness, or lesions  BUS/Urethra/Skene's glands:  Normal  Vagina:  Normal appearing with normal color and discharge, no lesions  Cervix:  Normal appearing without discharge or lesions  Uterus:  Normal in size, shape and contour.  Midline and mobile, nontender  Adnexa/parametria:     Rt: Normal in size, without masses or tenderness.   Lt: Normal in size, without masses or tenderness.  Anus and perineum: Normal  Digital rectal exam: Deferred  Patient informed chaperone available to be present for breast and pelvic exam. Patient has requested no chaperone to be present. Patient has been advised what will be completed during breast and pelvic exam.   Assessment/Plan:  62 y.o. G4 P4 for annual exam.    Well female exam with routine gynecological exam - CBC with Differential/Platelet, Comprehensive metabolic panel, Lipid panel. Education provided on SBEs, importance of preventative screenings, current guidelines, high calcium diet, regular exercise, and multivitamin daily.   Postmenopausal - No HRT, no bleeding.   HSV (herpes simplex virus) infection - takes Acyclovir as needed. Rare outbreaks. Does not need refills at this time.   Vitamin D deficiency - Plan: VITAMIN D 25 Hydroxy (Vit-D Deficiency, Fractures)  Screening for cervical cancer - Normal Pap history. Will repeat at 5-year interval per guidelines.   Screening for breast cancer - Normal mammogram history. Continue annual screenings.  Normal breast exam today.  Screening for colon cancer - 05/2021 colonoscopy. Will repeat at GI's recommended interval.   Screening for osteoporosis - Normal bone density 04/2021. Will repeat at 5-year interval per recommendation.   Follow up in 1 year for annual.    Tamela Gammon [facility], 11:46 AM 04/20/2022

## 2022-04-21 LAB — COMPREHENSIVE METABOLIC PANEL
AG Ratio: 1.8 (calc) (ref 1.0–2.5)
ALT: 17 U/L (ref 6–29)
AST: 18 U/L (ref 10–35)
Albumin: 4.5 g/dL (ref 3.6–5.1)
Alkaline phosphatase (APISO): 64 U/L (ref 37–153)
BUN: 14 mg/dL (ref 7–25)
CO2: 26 mmol/L (ref 20–32)
Calcium: 9.7 mg/dL (ref 8.6–10.4)
Chloride: 106 mmol/L (ref 98–110)
Creat: 0.77 mg/dL (ref 0.50–1.05)
Globulin: 2.5 g/dL (calc) (ref 1.9–3.7)
Glucose, Bld: 65 mg/dL (ref 65–99)
Potassium: 4.2 mmol/L (ref 3.5–5.3)
Sodium: 140 mmol/L (ref 135–146)
Total Bilirubin: 0.4 mg/dL (ref 0.2–1.2)
Total Protein: 7 g/dL (ref 6.1–8.1)

## 2022-04-21 LAB — LIPID PANEL
Cholesterol: 204 mg/dL — ABNORMAL HIGH (ref <200)
HDL: 72 mg/dL (ref >=50)
LDL Cholesterol (Calc): 110 mg/dL (calc) — ABNORMAL HIGH
Non-HDL Cholesterol (Calc): 132 mg/dL (calc) — ABNORMAL HIGH (ref <130)
Total CHOL/HDL Ratio: 2.8 (calc) (ref <5.0)
Triglycerides: 116 mg/dL (ref <150)

## 2022-04-21 LAB — CBC WITH DIFFERENTIAL/PLATELET
Absolute Monocytes: 581 cells/uL (ref 200–950)
Basophils Absolute: 98 cells/uL (ref 0–200)
Basophils Relative: 1.4 %
Eosinophils Absolute: 112 cells/uL (ref 15–500)
Eosinophils Relative: 1.6 %
HCT: 41.2 % (ref 35.0–45.0)
Hemoglobin: 13.8 g/dL (ref 11.7–15.5)
Lymphs Abs: 1708 cells/uL (ref 850–3900)
MCH: 32 pg (ref 27.0–33.0)
MCHC: 33.5 g/dL (ref 32.0–36.0)
MCV: 95.6 fL (ref 80.0–100.0)
MPV: 10.7 fL (ref 7.5–12.5)
Monocytes Relative: 8.3 %
Neutro Abs: 4501 cells/uL (ref 1500–7800)
Neutrophils Relative %: 64.3 %
Platelets: 346 10*3/uL (ref 140–400)
RBC: 4.31 10*6/uL (ref 3.80–5.10)
RDW: 11.8 % (ref 11.0–15.0)
Total Lymphocyte: 24.4 %
WBC: 7 10*3/uL (ref 3.8–10.8)

## 2022-04-21 LAB — VITAMIN D 25 HYDROXY (VIT D DEFICIENCY, FRACTURES): Vit D, 25-Hydroxy: 28 ng/mL — ABNORMAL LOW (ref 30–100)

## 2022-05-22 ENCOUNTER — Ambulatory Visit
Admission: RE | Admit: 2022-05-22 | Discharge: 2022-05-22 | Disposition: A | Discharge status: Home / Self Care | Payer: BC Managed Care – PPO | Source: Ambulatory Visit

## 2022-05-22 DIAGNOSIS — Z1231 Encounter for screening mammogram for malignant neoplasm of breast: Secondary | ICD-10-CM

## 2022-07-03 ENCOUNTER — Ambulatory Visit (HOSPITAL_BASED_OUTPATIENT_CLINIC_OR_DEPARTMENT_OTHER): Payer: BC Managed Care – PPO | Admitting: Orthopaedic Surgery

## 2022-07-06 ENCOUNTER — Other Ambulatory Visit: Payer: Self-pay

## 2022-07-06 DIAGNOSIS — E0500 Thyrotoxicosis with diffuse goiter without thyrotoxic crisis or storm: Secondary | ICD-10-CM

## 2022-07-07 ENCOUNTER — Other Ambulatory Visit (INDEPENDENT_AMBULATORY_CARE_PROVIDER_SITE_OTHER): Payer: BC Managed Care – PPO

## 2022-07-07 DIAGNOSIS — E0500 Thyrotoxicosis with diffuse goiter without thyrotoxic crisis or storm: Secondary | ICD-10-CM

## 2022-07-07 LAB — T3, FREE: T3, Free: 4.6 pg/mL — ABNORMAL HIGH (ref 2.3–4.2)

## 2022-07-07 LAB — T4, FREE: Free T4: 1.42 ng/dL (ref 0.60–1.60)

## 2022-07-07 LAB — TSH: TSH: <0.01 u[IU]/mL — ABNORMAL LOW (ref 0.35–5.50)

## 2022-07-13 MED ORDER — METHIMAZOLE 5 MG PO TABS: ORAL_TABLET | ORAL | 1 refills | Status: DC

## 2022-07-13 NOTE — Addendum Note (Signed)
Addended by: Carlus Pavlov on: 07/13/2022 02:02 PM   Modules accepted: Orders

## 2022-08-13 ENCOUNTER — Other Ambulatory Visit: Payer: BC Managed Care – PPO

## 2022-08-13 DIAGNOSIS — E0500 Thyrotoxicosis with diffuse goiter without thyrotoxic crisis or storm: Secondary | ICD-10-CM | POA: Diagnosis not present

## 2022-08-13 LAB — TSH: TSH: 0.01 u[IU]/mL — ABNORMAL LOW (ref 0.35–5.50)

## 2022-08-13 LAB — T3, FREE: T3, Free: 4.5 pg/mL — ABNORMAL HIGH (ref 2.3–4.2)

## 2022-08-13 LAB — T4, FREE: Free T4: 0.96 ng/dL (ref 0.60–1.60)

## 2022-08-14 NOTE — Addendum Note (Signed)
Addended by: Carlus Pavlov on: 08/14/2022 10:49 AM   Modules accepted: Orders

## 2022-08-25 DIAGNOSIS — M542 Cervicalgia: Secondary | ICD-10-CM | POA: Diagnosis not present

## 2022-08-25 DIAGNOSIS — M47812 Spondylosis without myelopathy or radiculopathy, cervical region: Secondary | ICD-10-CM | POA: Diagnosis not present

## 2022-09-13 ENCOUNTER — Encounter: Payer: Self-pay | Admitting: Internal Medicine

## 2022-10-01 ENCOUNTER — Other Ambulatory Visit: Payer: Self-pay | Admitting: Internal Medicine

## 2022-10-01 ENCOUNTER — Other Ambulatory Visit (INDEPENDENT_AMBULATORY_CARE_PROVIDER_SITE_OTHER): Payer: BC Managed Care – PPO

## 2022-10-01 DIAGNOSIS — E0500 Thyrotoxicosis with diffuse goiter without thyrotoxic crisis or storm: Secondary | ICD-10-CM

## 2022-10-01 LAB — T3, FREE: T3, Free: 3.7 pg/mL (ref 2.3–4.2)

## 2022-10-01 LAB — T4, FREE: Free T4: 0.54 ng/dL — ABNORMAL LOW (ref 0.60–1.60)

## 2022-10-01 LAB — TSH: TSH: 7.64 u[IU]/mL — ABNORMAL HIGH (ref 0.35–5.50)

## 2022-10-01 MED ORDER — METHIMAZOLE 5 MG PO TABS
ORAL_TABLET | ORAL | Status: DC
Start: 2022-10-01 — End: 2022-11-12

## 2022-10-01 NOTE — Addendum Note (Signed)
Addended by: Carlus Pavlov on: 10/01/2022 04:46 PM   Modules accepted: Orders

## 2022-10-08 ENCOUNTER — Ambulatory Visit (INDEPENDENT_AMBULATORY_CARE_PROVIDER_SITE_OTHER): Payer: BC Managed Care – PPO

## 2022-10-08 DIAGNOSIS — Z23 Encounter for immunization: Secondary | ICD-10-CM | POA: Diagnosis not present

## 2022-10-20 ENCOUNTER — Telehealth (HOSPITAL_BASED_OUTPATIENT_CLINIC_OR_DEPARTMENT_OTHER): Payer: Self-pay | Admitting: Orthopaedic Surgery

## 2022-10-20 NOTE — Telephone Encounter (Signed)
Patient states she wants to hold off for a couple of weeks to see how she feels.

## 2022-10-23 ENCOUNTER — Ambulatory Visit (HOSPITAL_BASED_OUTPATIENT_CLINIC_OR_DEPARTMENT_OTHER): Payer: BC Managed Care – PPO | Admitting: Orthopaedic Surgery

## 2022-10-23 DIAGNOSIS — M25552 Pain in left hip: Secondary | ICD-10-CM | POA: Diagnosis not present

## 2022-10-23 DIAGNOSIS — M25551 Pain in right hip: Secondary | ICD-10-CM | POA: Diagnosis not present

## 2022-10-23 NOTE — Progress Notes (Signed)
Chief Complaint: Bilateral hip pain limited range of motion     History of Present Illness:   10/23/2022: Presents today for follow-up of the right hip.  She did not experience any relief from her injection.  She is here today for further discussion   Sarah Jimenez is a 62 y.o. female presents today with bilateral hip pain and loss of motion.  She does have a history of Graves' disease for which she has had a frozen shoulders and both of her shoulders.  With regard to the right hip for 2 weeks she states that there has been loss of motion and pain around the groin area.  The left hip there is pain more about the anterior lateral hip.  Pain particular on the right is quite similar to how she felt with her frozen shoulder.  She is very active enjoys doing things like playing pickle ball.  This has been limited as a result of her hip pain.  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    Surgical History:   None  PMH/PSH/Family History/Social History/Meds/Allergies:    Past Medical History:  Diagnosis Date   Cancer (HCC)    Pre cancer skin   Graves disease 09/2018   Heart murmur    Past Surgical History:  Procedure Laterality Date   COLONOSCOPY  05/08/2011   Dr.Brodie   DILATATION & CURETTAGE/HYSTEROSCOPY WITH MYOSURE N/A 01/01/2015   Procedure: DILATATION & CURETTAGE/HYSTEROSCOPY WITH MYOSURE;  Surgeon: Ok Edwards, MD;  Location: [facility];  Service: Gynecology;  Laterality: N/A;   pyloric stenosis  4233   69 month old   Social History   Socioeconomic History   Marital status: Single    Spouse name: Not on file   Number of children: Not on file   Years of education: Not on file   Highest education level: Not on file  Occupational History   Occupation: communications  Tobacco Use   Smoking status: Never   Smokeless tobacco: Never  Vaping Use   Vaping status: Never Used  Substance and Sexual Activity   Alcohol use: Yes     Alcohol/week: 1.0 standard drink of alcohol    Types: 1 Glasses of wine per week    Comment: one/wk   Drug use: Yes    Types: Marijuana    Comment: THS Gummy   Sexual activity: Not Currently    Birth control/protection: Other-see comments    Comment: vasectomy, INTERCOURSE AGE 2, SEXUAL PARTNERS MORE THAN 5  Other Topics Concern   Not on file  Social History Narrative   Not on file   Social Determinants of Health   Financial Resource Strain: Not on file  Food Insecurity: Not on file  Transportation Needs: Not on file  Physical Activity: Not on file  Stress: Not on file  Social Connections: Not on file   Family History  Problem Relation Age of Onset   Hypertension Father    Cancer Father 8       pancreatic   Cancer Sister    Depression Brother    Mental illness Brother    Colon cancer Paternal Uncle 27   Early death Maternal Grandmother    Hearing loss Maternal Grandfather    Early death Maternal Grandfather    Heart disease Maternal Grandfather    Hypertension  Daughter    Kidney disease Son    Stomach cancer Neg Hx    Esophageal cancer Neg Hx    Colon polyps Neg Hx    No Known Allergies Current Outpatient Medications  Medication Sig Dispense Refill   acyclovir (ZOVIRAX) 200 MG capsule TAKE 5 TIMES DAILY FOR 3 TO 5 DAYS AS NEEDED 90 capsule 0   ALPRAZolam (XANAX) 0.25 MG tablet Take 1 tablet (0.25 mg total) by mouth at bedtime as needed. for sleep 30 tablet 0   EFUDEX 5 % cream Apply topically. In the fall.     methimazole (TAPAZOLE) 5 MG tablet TAKE 1 TABLET BY MOUTH 1x a day     Multiple Vitamin (MULTIVITAMIN PO) 1 tablet daily.     No current facility-administered medications for this visit.   No results found.  Review of Systems:   A ROS was performed including pertinent positives and negatives as documented in the HPI.  Physical Exam :   Constitutional: NAD and appears stated age Neurological: Alert and oriented Psych: Appropriate affect and  cooperative Last menstrual period 10/30/2014.   Comprehensive Musculoskeletal Exam:    Inspection Right Left  Skin No atrophy or gross abnormalities appreciated No atrophy or gross abnormalities appreciated  Palpation    Tenderness Femoral acetabular Femoral acetabular  Crepitus None None  Range of Motion    Flexion (passive) 120 120  Extension 30 30  IR 20 30 with pain  ER 31 45  Strength    Flexion  5/5 5/5  Extension 5/5 5/5  Special Tests    FABER Negative Negative  FADIR Negative Negative  ER Lag/Capsular Insufficiency Negative Negative  Instability Negative Negative  Sacroiliac pain Negative  Negative   Instability    Generalized Laxity No No  Neurologic    sciatic, femoral, obturator nerves intact to light sensation  Vascular/Lymphatic    DP pulse 2+ 2+  Lumbar Exam    Patient has symmetric lumbar range of motion with negative pain referral to hip     Imaging:   Xray (AP pelvis, 3 views right hip, 3 views left hip): Normal    I personally reviewed and interpreted the radiographs.   Assessment:   62 y.o. female with predominantly right hip pain in the setting of a pincer impingement with coxa profunda.  Given the fact that she is still symptomatic after physical therapy and injection I would like to plan to send her for an MRI of the right hip we can further assess if there is any type of pincer/labral tear.  Return to clinic following MRI  Plan :    -Return to clinic following MRI    Procedure Note  Patient: Sarah Jimenez             Date of Birth: December 17, 1960           MRN: [id number]             Visit Date: 10/23/2022  Procedures: Visit Diagnoses:  No diagnosis found.   No procedures performed       I personally saw and evaluated the patient, and participated in the management and treatment plan.  Huel Cote, MD Attending Physician, Orthopedic Surgery  This document was dictated using Dragon voice recognition software. A  reasonable attempt at proof reading has been made to minimize errors.

## 2022-10-28 ENCOUNTER — Inpatient Hospital Stay
Admission: RE | Admit: 2022-10-28 | Discharge: 2022-10-28 | Discharge status: Home / Self Care | Payer: BC Managed Care – PPO | Source: Ambulatory Visit | Attending: Orthopaedic Surgery

## 2022-10-28 ENCOUNTER — Encounter (HOSPITAL_BASED_OUTPATIENT_CLINIC_OR_DEPARTMENT_OTHER): Payer: Self-pay | Admitting: Orthopaedic Surgery

## 2022-10-28 DIAGNOSIS — M25551 Pain in right hip: Secondary | ICD-10-CM

## 2022-10-28 DIAGNOSIS — M1611 Unilateral primary osteoarthritis, right hip: Secondary | ICD-10-CM | POA: Diagnosis not present

## 2022-10-28 DIAGNOSIS — G8929 Other chronic pain: Secondary | ICD-10-CM | POA: Diagnosis not present

## 2022-11-06 ENCOUNTER — Other Ambulatory Visit: Payer: BC Managed Care – PPO

## 2022-11-11 ENCOUNTER — Other Ambulatory Visit (INDEPENDENT_AMBULATORY_CARE_PROVIDER_SITE_OTHER): Payer: BC Managed Care – PPO

## 2022-11-11 DIAGNOSIS — E0500 Thyrotoxicosis with diffuse goiter without thyrotoxic crisis or storm: Secondary | ICD-10-CM

## 2022-11-12 ENCOUNTER — Other Ambulatory Visit: Payer: Self-pay | Admitting: Internal Medicine

## 2022-11-12 DIAGNOSIS — E0500 Thyrotoxicosis with diffuse goiter without thyrotoxic crisis or storm: Secondary | ICD-10-CM

## 2022-11-12 LAB — T4, FREE: Free T4: 0.98 ng/dL (ref 0.60–1.60)

## 2022-11-12 LAB — TSH: TSH: 0.11 u[IU]/mL — ABNORMAL LOW (ref 0.35–5.50)

## 2022-11-12 LAB — T3, FREE: T3, Free: 4.1 pg/mL (ref 2.3–4.2)

## 2022-11-12 MED ORDER — METHIMAZOLE 5 MG PO TABS
ORAL_TABLET | ORAL | Status: DC
Start: 2022-11-12 — End: 2022-12-14

## 2022-11-27 ENCOUNTER — Ambulatory Visit (HOSPITAL_BASED_OUTPATIENT_CLINIC_OR_DEPARTMENT_OTHER): Payer: BC Managed Care – PPO | Admitting: Orthopaedic Surgery

## 2022-11-27 DIAGNOSIS — M25552 Pain in left hip: Secondary | ICD-10-CM

## 2022-11-27 DIAGNOSIS — M25551 Pain in right hip: Secondary | ICD-10-CM | POA: Diagnosis not present

## 2022-11-27 NOTE — Progress Notes (Signed)
Chief Complaint: Bilateral hip pain limited range of motion     History of Present Illness:   11/27/2022: Today for MRI follow-up and discussion.   Sarah Jimenez is a 62 y.o. female presents today with bilateral hip pain and loss of motion.  She does have a history of Graves' disease for which she has had a frozen shoulders and both of her shoulders.  With regard to the right hip for 2 weeks she states that there has been loss of motion and pain around the groin area.  The left hip there is pain more about the anterior lateral hip.  Pain particular on the right is quite similar to how she felt with her frozen shoulder.  She is very active enjoys doing things like playing pickle ball.  This has been limited as a result of her hip pain.  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    Surgical History:   None  PMH/PSH/Family History/Social History/Meds/Allergies:    Past Medical History:  Diagnosis Date   Cancer (HCC)    Pre cancer skin   Graves disease 09/2018   Heart murmur    Past Surgical History:  Procedure Laterality Date   COLONOSCOPY  05/08/2011   Dr.Brodie   DILATATION & CURETTAGE/HYSTEROSCOPY WITH MYOSURE N/A 01/01/2015   Procedure: DILATATION & CURETTAGE/HYSTEROSCOPY WITH MYOSURE;  Surgeon: Ok Edwards, MD;  Location: [facility];  Service: Gynecology;  Laterality: N/A;   pyloric stenosis  3751   40 month old   Social History   Socioeconomic History   Marital status: Single    Spouse name: Not on file   Number of children: Not on file   Years of education: Not on file   Highest education level: Not on file  Occupational History   Occupation: communications  Tobacco Use   Smoking status: Never   Smokeless tobacco: Never  Vaping Use   Vaping status: Never Used  Substance and Sexual Activity   Alcohol use: Yes    Alcohol/week: 1.0 standard drink of alcohol    Types: 1 Glasses of wine per week    Comment: one/wk   Drug  use: Yes    Types: Marijuana    Comment: THS Gummy   Sexual activity: Not Currently    Birth control/protection: Other-see comments    Comment: vasectomy, INTERCOURSE AGE 35, SEXUAL PARTNERS MORE THAN 5  Other Topics Concern   Not on file  Social History Narrative   Not on file   Social Determinants of Health   Financial Resource Strain: Not on file  Food Insecurity: Not on file  Transportation Needs: Not on file  Physical Activity: Not on file  Stress: Not on file  Social Connections: Not on file   Family History  Problem Relation Age of Onset   Hypertension Father    Cancer Father 71       pancreatic   Cancer Sister    Depression Brother    Mental illness Brother    Colon cancer Paternal Uncle 63   Early death Maternal Grandmother    Hearing loss Maternal Grandfather    Early death Maternal Grandfather    Heart disease Maternal Grandfather    Hypertension Daughter    Kidney disease Son    Stomach cancer Neg Hx    Esophageal cancer Neg  Hx    Colon polyps Neg Hx    No Known Allergies Current Outpatient Medications  Medication Sig Dispense Refill   acyclovir (ZOVIRAX) 200 MG capsule TAKE 5 TIMES DAILY FOR 3 TO 5 DAYS AS NEEDED 90 capsule 0   ALPRAZolam (XANAX) 0.25 MG tablet Take 1 tablet (0.25 mg total) by mouth at bedtime as needed. for sleep 30 tablet 0   EFUDEX 5 % cream Apply topically. In the fall.     methimazole (TAPAZOLE) 5 MG tablet TAKE by mouth 1 tablet alternating with 2 tablets every other day     Multiple Vitamin (MULTIVITAMIN PO) 1 tablet daily.     No current facility-administered medications for this visit.   No results found.  Review of Systems:   A ROS was performed including pertinent positives and negatives as documented in the HPI.  Physical Exam :   Constitutional: NAD and appears stated age Neurological: Alert and oriented Psych: Appropriate affect and cooperative Last menstrual period 10/30/2014.   Comprehensive Musculoskeletal  Exam:    Inspection Right Left  Skin No atrophy or gross abnormalities appreciated No atrophy or gross abnormalities appreciated  Palpation    Tenderness Femoral acetabular Femoral acetabular  Crepitus None None  Range of Motion    Flexion (passive) 120 120  Extension 30 30  IR 20 30 with pain  ER 31 45  Strength    Flexion  5/5 5/5  Extension 5/5 5/5  Special Tests    FABER Negative Negative  FADIR Negative Negative  ER Lag/Capsular Insufficiency Negative Negative  Instability Negative Negative  Sacroiliac pain Negative  Negative   Instability    Generalized Laxity No No  Neurologic    sciatic, femoral, obturator nerves intact to light sensation  Vascular/Lymphatic    DP pulse 2+ 2+  Lumbar Exam    Patient has symmetric lumbar range of motion with negative pain referral to hip     Imaging:   Xray (AP pelvis, 3 views right hip, 3 views left hip): Normal   MRI right hip: There is evidence of reactive marrow edema in the acetabulum consistent with full-thickness cartilage loss in the acetabulum and the weightbearing down I personally reviewed and interpreted the radiographs.   Assessment:   62 y.o. female with predominantly right hip pain with an MRI that does show more advanced arthritis than initially anticipated on the x-rays.  I did describe that she does have full-thickness cartilage loss with significant reactive edema in the acetabulum and the weightbearing dome.  Given this I did discuss that I do believe she would ultimately be a candidate for hip arthroplasty.  She has now trialed an injection with therapy without any relief.  I will plan to refer her to Dr. Magnus Ivan for further discussion of a possible right hip arthroplasty  Plan :    -Plan for referral to Dr. Magnus Ivan for right hip arthroplasty       I personally saw and evaluated the patient, and participated in the management and treatment plan.  Huel Cote, MD Attending Physician, Orthopedic  Surgery  This document was dictated using Dragon voice recognition software. A reasonable attempt at proof reading has been made to minimize errors.

## 2022-12-09 ENCOUNTER — Encounter: Payer: Self-pay | Admitting: Obstetrics and Gynecology

## 2022-12-09 ENCOUNTER — Ambulatory Visit (INDEPENDENT_AMBULATORY_CARE_PROVIDER_SITE_OTHER): Payer: BC Managed Care – PPO | Admitting: Obstetrics and Gynecology

## 2022-12-09 VITALS — BP 118/80 | HR 69

## 2022-12-09 DIAGNOSIS — N644 Mastodynia: Secondary | ICD-10-CM | POA: Diagnosis not present

## 2022-12-09 DIAGNOSIS — R4689 Other symptoms and signs involving appearance and behavior: Secondary | ICD-10-CM

## 2022-12-10 NOTE — Progress Notes (Signed)
   Acute Office Visit  Subjective:    Patient ID: Sarah Jimenez, female    DOB: October 01, 1960, 62 y.o.   MRN: [id number]   HPI 62 y.o. presents today for breast problem (Pt c/o shooting pains in left breast, red & irritation//jj) . On left areola after wearing a tight braw improved today.  Patient's last menstrual period was 10/30/2014.    Review of Systems     Objective:    OBGyn Exam  BP 118/80   Pulse 69   LMP 10/30/2014   SpO2 98%  Wt Readings from Last 3 Encounters:  04/20/22 149 lb (67.6 kg)  03/23/22 149 lb 6.4 oz (67.8 kg)  01/05/22 148 lb 6.4 oz (67.3 kg)   05/22/22 mammogram Birads 1 neg    Breast: no palpable masses b/l 1mm abrasion on areola no erythema no palpable masses or hypopigmentations  Patient informed chaperone available to be present for breast and/or pelvic exam. Patient has requested no chaperone to be present. Patient has been advised what will be completed during breast and pelvic exam.   Assessment & Plan:  There are no diagnoses linked to this encounter.     Earley Favor

## 2022-12-13 ENCOUNTER — Other Ambulatory Visit: Payer: Self-pay | Admitting: Internal Medicine

## 2022-12-13 DIAGNOSIS — E0500 Thyrotoxicosis with diffuse goiter without thyrotoxic crisis or storm: Secondary | ICD-10-CM

## 2022-12-14 NOTE — Telephone Encounter (Signed)
Methimazole refill request complete

## 2022-12-16 ENCOUNTER — Ambulatory Visit: Payer: BC Managed Care – PPO | Admitting: Orthopaedic Surgery

## 2022-12-16 ENCOUNTER — Encounter: Payer: Self-pay | Admitting: Orthopaedic Surgery

## 2022-12-16 VITALS — Ht 63.58 in | Wt 151.6 lb

## 2022-12-16 DIAGNOSIS — M1611 Unilateral primary osteoarthritis, right hip: Secondary | ICD-10-CM | POA: Diagnosis not present

## 2022-12-16 NOTE — Progress Notes (Signed)
The patient is a very pleasant and active 62 year old female sent to me by my partner Dr. Steward Drone to evaluate and treat right hip pain is beginning worse for the last 4 years.  Even the last few months a few weeks has gotten significantly worse with the right hip and groin pain.  She has plain films of her pelvis and right hip from a year ago but recently in October of this year had a MRI of her right hip showing severe arthritis in the right hip.  Her pain is daily and it is detrimentally affecting her mobility, her quality of life and her actives daily living.  She is a thin individual.  She is not a diabetic.  She does not have any significant medical issues or problems.  She is not a smoker and there is no history of any significant alcohol use.  She currently denies any headache, chest pain, shortness of breath, fever, chills, nausea, vomiting.  I was able to review all of her medications and past medical history within epic.  On exam her left hip moves smoothly and fluidly.  Her right hip has severe pain in the groin with internal and external rotation as well as limitations in rotation.  The pain is quite significant.  I did review the plain films of her hip and pelvis from 2023 but also the recent MRI and went over those images with her.  She has significant arthritis of the weightbearing surface of her right hip with some areas of full-thickness cartilage loss.  There is a large degenerative labral tear as well as significant subchondral edema in the weightbearing surface of the femoral head and especially acetabulum.  This is definitely end-stage arthritis.  We had a long thorough discussion about her hip and the recommendation of hip replacement surgery.  We talked about what to expect from an intraoperative and postoperative standpoint.  I discussed the risks and benefits of the surgery and went over hip replacement model and her imaging studies in detail.  She is definitely interested in having  surgery sometime after the first of the year as our schedule allows.  All questions concerns were answered and addressed.  She knows we will be in touch.

## 2022-12-28 ENCOUNTER — Encounter: Payer: Self-pay | Admitting: Orthopaedic Surgery

## 2023-01-06 ENCOUNTER — Encounter: Payer: Self-pay | Admitting: Internal Medicine

## 2023-01-06 ENCOUNTER — Ambulatory Visit: Payer: BC Managed Care – PPO | Admitting: Internal Medicine

## 2023-01-06 VITALS — BP 120/60 | HR 74 | Ht 63.58 in | Wt 149.8 lb

## 2023-01-06 DIAGNOSIS — E0500 Thyrotoxicosis with diffuse goiter without thyrotoxic crisis or storm: Secondary | ICD-10-CM

## 2023-01-06 DIAGNOSIS — R635 Abnormal weight gain: Secondary | ICD-10-CM | POA: Diagnosis not present

## 2023-01-06 NOTE — Progress Notes (Signed)
Patient ID: Sarah Jimenez, female   DOB: 11-01-1960, 62 y.o.   MRN: [id number]   HPI  Sarah Jimenez is a 62 y.o.-year-old female, initially referred by her gastroenterologist, Dr. Orvan Falconer, returning for follow-up for Graves' disease. Last visit 1 year ago.  Interim history: She denies tremors, palpitations, insomnia, anxiety. Fatigue resolved.  She has an occasional feeling of "Buzzing" in her head.  She had this more frequently in the past, before her diagnosis of Graves' disease, now very seldom. She has R hip pain (OA) >> will have a THR next mo.  Reviewed history: Patient was found to have an undetectable TSH during investigation of diarrhea, which started in 04/2017.  Previous TSH levels from 2017 and 2014 were normal.  Reviewed her TFTs: Lab Results  Component Value Date   TSH 0.11 (L) 11/11/2022   TSH 7.64 (H) 10/01/2022   TSH 0.01 (L) 08/13/2022   TSH <0.01 (L) 07/07/2022   TSH 2.83 02/11/2022   TSH 7.09 (H) 01/05/2022   TSH 1.98 11/20/2021   TSH 0.01 (L) 09/09/2021   TSH 0.00 Repeated and verified X2. (L) 07/31/2021   TSH 0.04 (L) 02/26/2021   FREET4 0.98 11/11/2022   FREET4 0.54 (L) 10/01/2022   FREET4 0.96 08/13/2022   FREET4 1.42 07/07/2022   FREET4 0.77 02/11/2022   FREET4 0.59 (L) 01/05/2022   FREET4 0.66 11/20/2021   FREET4 0.87 09/09/2021   FREET4 1.28 07/31/2021   FREET4 0.89 02/26/2021   T3FREE 4.1 11/11/2022   T3FREE 3.7 10/01/2022   T3FREE 4.5 (H) 08/13/2022   T3FREE 4.6 (H) 07/07/2022   T3FREE 3.5 02/11/2022   T3FREE 3.2 01/05/2022   T3FREE 3.6 11/20/2021   T3FREE 3.3 09/09/2021   T3FREE 4.3 (H) 07/31/2021   T3FREE 3.8 02/26/2021   Her TSI antibodies were not elevated: Lab Results  Component Value Date   TSI 112 08/16/2018   Thyroid uptake and scan (10/13/2018): Uptake at 4 hours 29.4%, uptake in 24 hours, 42.5%, both elevated.  Uniform scan, consistent with Graves' disease.  08/2018: We started methimazole (MMI), initially 5 mg  daily. 05/2018: MMI increase to 5 mg 2x a day 10/2018: MMI decrease to 5 mg in a.m. and 2.5 mg in p.m. 11/2018: She increased MMI dose to 5 mg 2x a day 03/2019: MMI decreased to 5 mg daily 05/2019: MMI decreased to 2.5 mg daily 08/2019: she stopped MMI by herself 10/2019: MMI restarted at 5 mg daily 12/2020: MMI increase to 7.5 mg daily 07/2021: MMI increased to 5 mg 2x a day (could not tolerated 10 mg in am and 5 mg in pm - AP) 12/2021: MMI decreased to 5 mg daily 06/2022: MMI increased to 5 mg 2x a day 09/2022: MMI decreased to 5 mg daily 10/2022: MMI increased to 5 mg alternating with 10 mg every other day  Pt denies: - feeling nodules in neck - hoarseness - dysphagia - choking  She initially had the following symptoms: Diarrhea, which improved after changing her diet and cutting out meat and dairy.   Occasional palpitations Fatigue Insomnia Tremors The symptoms have resolved.  No FH of thyroid disease or thyroid cancer. No h/o radiation tx to head or neck. No Biotin use. No recent steroids use.  Latest steroid injection in hip was a year ago  + CBD gummieat night - helps her sleep. Of note, a DXA scan was normal in 2017. 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 She also has a history of frozen  shoulders.  ROS: + see HPI  I reviewed pt's medications, allergies, PMH, social hx, family hx, and changes were documented in the history of present illness. Otherwise, unchanged from my initial visit note.  Past Medical History:  Diagnosis Date   Cancer [facility])    Pre cancer skin   Graves disease 09/2018   Heart murmur    Past Surgical History:  Procedure Laterality Date   COLONOSCOPY  05/08/2011   Dr.Brodie   DILATATION & CURETTAGE/HYSTEROSCOPY WITH MYOSURE N/A 01/01/2015   Procedure: DILATATION & CURETTAGE/HYSTEROSCOPY WITH MYOSURE;  Surgeon: Ok Edwards, MD;  Location: [facility];  Service: Gynecology;  Laterality: N/A;   pyloric stenosis  1923   38 month old    Social History   Socioeconomic History   Marital status: Single    Spouse name: Not on file   Number of children: 4   Years of education: Not on file   Highest education level: Not on file  Occupational History    Communication coordinator  Tobacco Use   Smoking status: Never Smoker   Smokeless tobacco: Never Used  Substance and Sexual Activity   Alcohol use: Yes    Alcohol/week: 2.0 standard drinks    Types: 2 Glasses of wine per week    Comment: LITTLE   Drug use: Yes    Comment: Edible Marijuana tried while in [place]   Sexual activity: Not Currently    Birth control/protection: Other-see comments    Comment: vasectomy, INTERCOURSE AGE 79, SEXUAL PARTNERS MORE THAN 5   Current Outpatient Medications on File Prior to Visit  Medication Sig Dispense Refill   acyclovir (ZOVIRAX) 200 MG capsule TAKE 5 TIMES DAILY FOR 3 TO 5 DAYS AS NEEDED 90 capsule 0   ALPRAZolam (XANAX) 0.25 MG tablet Take 1 tablet (0.25 mg total) by mouth at bedtime as needed. for sleep 30 tablet 0   Cholecalciferol (VITAMIN D3 PO) Take by mouth.     EFUDEX 5 % cream Apply topically. In the fall.     methimazole (TAPAZOLE) 5 MG tablet TAKE 2 TABLETS BY MOUTH EVERY MORNING AND 1 TABLET EVERY EVENING 270 tablet 1   Multiple Vitamin (MULTIVITAMIN PO) 1 tablet daily.     No current facility-administered medications on file prior to visit.   No Known Allergies Family History  Problem Relation Age of Onset   Hypertension Father    Cancer Father 76       pancreatic   Cancer Sister    Depression Brother    Mental illness Brother    Colon cancer Paternal Uncle 63   Early death Maternal Grandmother    Hearing loss Maternal Grandfather    Early death Maternal Grandfather    Heart disease Maternal Grandfather    Hypertension Daughter    Kidney disease Son    Stomach cancer Neg Hx    Esophageal cancer Neg Hx    Colon polyps Neg Hx     PE: BP 120/60   Pulse 74   Ht 5' 3.58" (1.615 m)   Wt 149 lb  12.8 oz (67.9 kg)   LMP 10/30/2014   SpO2 96%   BMI 26.05 kg/m  Wt Readings from Last 10 Encounters:  01/06/23 149 lb 12.8 oz (67.9 kg)  12/16/22 151 lb 9.6 oz (68.8 kg)  04/20/22 149 lb (67.6 kg)  03/23/22 149 lb 6.4 oz (67.8 kg)  01/05/22 148 lb 6.4 oz (67.3 kg)  12/26/21 145 lb 3.2 oz (65.9 kg)  05/30/21 149 lb (  67.6 kg)  05/16/21 149 lb (67.6 kg)  04/02/21 149 lb (67.6 kg)  03/20/21 148 lb 6 oz (67.3 kg)   Constitutional: normal weight, in NAD Eyes:  EOMI, no exophthalmos ENT: no neck masses, no cervical lymphadenopathy Cardiovascular: RRR, No MRG Respiratory: CTA B Musculoskeletal: no deformities Skin:no rashes Neurological: no tremor with outstretched hands  ASSESSMENT: 1.  Graves' disease  2. Weight gain  PLAN:  1. Patient with history of Graves' disease diagnosed after being found thyrotoxic and symptomatic with diarrhea, palpitations, but no significant anxiety, heat intolerance, or weight loss.  She was started on methimazole and the dose was subsequently adjusted.  At last visit, she was on 5 mg twice a day.  We previously tried to increase the dose up to 10 mg in a.m. and 5 mg in p.m. but she was not able to tolerate this due to to abdominal pain. -At last visit, her thyroid tests appeared to be overly corrected by methimazole, with a TSH of 7.09 and a low free T4 so we backed off methimazole to 5 mg daily, but we did have to increase the dose up to 10 mg alternating with 5 mg a day as the latest TSH was still suppressed, at 0.11 on 11/11/2022. -At today's visit, she does not describe thyrotoxic signs or symptoms.  She also does not have signs of active Graves' abdomen fatigue: Blurry vision, double vision, eye pain, chemosis -However, due to instability of her thyroid tests, we again discussed about definitive treatment for Graves' disease to include RAI ablation or surgery.  At today's visit we discussed about waiting until she has her hip replacement surgery, and  then I advised her to consider RAI treatment.  We discussed about outcomes with the vast majority of patients becoming hypothyroid and needing levothyroxine for life.  Discussed about what happens if hypothyroidism becomes uncontrolled.  We also discussed about consequences of uncontrolled hypothyroidism. -For now, we discussed about rechecking her TFTs and changing the methimazole dose accordingly -I will see her back in 6 months  2.  Weight gain -She was able to lose approximately 30 pounds on a more plant-based diet in the past, but then stopped the diet and gained some weight, 3 pounds net since last visit -At today's visit, we will weight is approximately stable. -She inquires about a plant-based diet.  We discussed about this and how to apply it to improve her immune system.  Component     Latest Ref Rng 01/06/2023  TSH     0.40 - 4.50 mIU/L 0.01 (L)   T4,Free(Direct)     0.8 - 1.8 ng/dL 1.7   Triiodothyronine,Free,Serum     2.3 - 4.2 pg/mL 5.5 (H)   Thyroid tests are worse.  I will suggest to increase the dose to 10 mg methimazole daily and recheck the tests in 1.5 months.  Carlus Pavlov, MD PhD [facility] Endocrinology

## 2023-01-06 NOTE — Patient Instructions (Addendum)
Please stop at the lab.  Please continue Methimazole 10 mg alternating with 5 mg every other day.  Please come back for a follow-up appointment in 6 months, but likely sooner for labs

## 2023-01-07 ENCOUNTER — Telehealth: Payer: Self-pay

## 2023-01-07 NOTE — Telephone Encounter (Signed)
Patient called back regarding scheduling THA.  She stated she just had an appt with her Endocrinologist that told her her Thyroid is not regulated so she should not be put under anesthesia until treated.  Once that is taken care of, we agreed patient would contact me back regarding scheduling and if she needs to be seen again.

## 2023-01-08 ENCOUNTER — Encounter: Payer: Self-pay | Admitting: Internal Medicine

## 2023-01-08 DIAGNOSIS — Z85828 Personal history of other malignant neoplasm of skin: Secondary | ICD-10-CM | POA: Diagnosis not present

## 2023-01-08 DIAGNOSIS — L821 Other seborrheic keratosis: Secondary | ICD-10-CM | POA: Diagnosis not present

## 2023-01-08 DIAGNOSIS — L570 Actinic keratosis: Secondary | ICD-10-CM | POA: Diagnosis not present

## 2023-01-08 DIAGNOSIS — L218 Other seborrheic dermatitis: Secondary | ICD-10-CM | POA: Diagnosis not present

## 2023-01-08 MED ORDER — METHIMAZOLE 5 MG PO TABS
ORAL_TABLET | ORAL | Status: DC
Start: 2023-01-08 — End: 2023-03-16

## 2023-01-11 LAB — T4, FREE: Free T4: 1.7 ng/dL (ref 0.8–1.8)

## 2023-01-11 LAB — T3, FREE: T3, Free: 5.5 pg/mL — ABNORMAL HIGH (ref 2.3–4.2)

## 2023-01-11 LAB — TSH: TSH: 0.01 m[IU]/L — ABNORMAL LOW (ref 0.40–4.50)

## 2023-01-11 LAB — THYROID STIMULATING IMMUNOGLOBULIN: TSI: 276 %{baseline} — ABNORMAL HIGH (ref <140)

## 2023-01-15 ENCOUNTER — Other Ambulatory Visit: Payer: Self-pay | Admitting: Nurse Practitioner

## 2023-01-15 DIAGNOSIS — B009 Herpesviral infection, unspecified: Secondary | ICD-10-CM

## 2023-01-15 NOTE — Telephone Encounter (Signed)
Med refill request: Zovirax Last AEX: 04/20/22 Next AEX: not scheduled Last MMG (if hormonal med) 05/22/22 Refill authorized: Please Advise, 390, 0 RF

## 2023-01-18 MED ORDER — ACYCLOVIR 200 MG PO CAPS
ORAL_CAPSULE | ORAL | 0 refills | Status: DC
Start: 2023-01-18 — End: 2023-09-06

## 2023-02-17 ENCOUNTER — Other Ambulatory Visit: Payer: BC Managed Care – PPO

## 2023-02-17 DIAGNOSIS — E0500 Thyrotoxicosis with diffuse goiter without thyrotoxic crisis or storm: Secondary | ICD-10-CM | POA: Diagnosis not present

## 2023-02-18 ENCOUNTER — Encounter: Payer: Self-pay | Admitting: Internal Medicine

## 2023-02-18 ENCOUNTER — Other Ambulatory Visit: Payer: Self-pay | Admitting: Internal Medicine

## 2023-02-18 DIAGNOSIS — E0500 Thyrotoxicosis with diffuse goiter without thyrotoxic crisis or storm: Secondary | ICD-10-CM

## 2023-02-18 LAB — T3, FREE: T3, Free: 4.6 pg/mL — ABNORMAL HIGH (ref 2.3–4.2)

## 2023-02-18 LAB — T4, FREE: Free T4: 1.5 ng/dL (ref 0.8–1.8)

## 2023-02-18 LAB — TSH: TSH: 0.01 m[IU]/L — ABNORMAL LOW (ref 0.40–4.50)

## 2023-03-16 ENCOUNTER — Telehealth: Payer: Self-pay | Admitting: Internal Medicine

## 2023-03-16 DIAGNOSIS — E0500 Thyrotoxicosis with diffuse goiter without thyrotoxic crisis or storm: Secondary | ICD-10-CM

## 2023-03-16 MED ORDER — METHIMAZOLE 5 MG PO TABS: ORAL_TABLET | ORAL | 1 refills | Status: DC

## 2023-03-16 NOTE — Telephone Encounter (Signed)
 Requested Prescriptions   Signed Prescriptions Disp Refills   methimazole (TAPAZOLE) 5 MG tablet 180 tablet 1    Sig: Take 2 tablets by mouth every other morning    Authorizing Provider: Carlus Pavlov    Ordering User: Pollie Meyer

## 2023-03-16 NOTE — Telephone Encounter (Signed)
 MEDICATION: methimazole methimazole (TAPAZOLE) 5 MG tablet  PHARMACY:    [id number] - [place], [place] - [address] AT [address] [facility] [address] (Ph: [phone])    HAS THE PATIENT CONTACTED THEIR PHARMACY?  Yes  IS THIS A 90 DAY SUPPLY : Yes  IS PATIENT OUT OF MEDICATION: No  IF NOT; HOW MUCH IS LEFT: 4 Days  LAST APPOINTMENT DATE: @12 /11/2022  NEXT APPOINTMENT DATE:@6 /12/2023  DO WE HAVE YOUR PERMISSION TO LEAVE A DETAILED MESSAGE?: Yes  OTHER COMMENTS:    **Let patient know to contact pharmacy at the end of the day to make sure medication is ready. **  ** Please notify patient to allow 48-72 hours to process**  **Encourage patient to contact the pharmacy for refills or they can request refills through [facility]**

## 2023-03-31 ENCOUNTER — Other Ambulatory Visit: Payer: BC Managed Care – PPO

## 2023-03-31 DIAGNOSIS — E0500 Thyrotoxicosis with diffuse goiter without thyrotoxic crisis or storm: Secondary | ICD-10-CM | POA: Diagnosis not present

## 2023-04-01 ENCOUNTER — Encounter: Payer: Self-pay | Admitting: Internal Medicine

## 2023-04-01 LAB — TSH: TSH: 0.04 m[IU]/L — ABNORMAL LOW (ref 0.40–4.50)

## 2023-04-01 LAB — T4, FREE: Free T4: 1 ng/dL (ref 0.8–1.8)

## 2023-04-01 LAB — T3, FREE: T3, Free: 3 pg/mL (ref 2.3–4.2)

## 2023-04-05 ENCOUNTER — Encounter: Payer: BC Managed Care – PPO | Admitting: Family Medicine

## 2023-04-08 ENCOUNTER — Other Ambulatory Visit: Payer: Self-pay | Admitting: Nurse Practitioner

## 2023-04-08 DIAGNOSIS — Z1231 Encounter for screening mammogram for malignant neoplasm of breast: Secondary | ICD-10-CM

## 2023-04-14 ENCOUNTER — Ambulatory Visit (INDEPENDENT_AMBULATORY_CARE_PROVIDER_SITE_OTHER): Payer: BC Managed Care – PPO | Admitting: Family Medicine

## 2023-04-14 VITALS — BP 110/72 | HR 66 | Temp 97.5°F | Resp 18 | Ht 63.58 in | Wt 144.4 lb

## 2023-04-14 DIAGNOSIS — Z0000 Encounter for general adult medical examination without abnormal findings: Secondary | ICD-10-CM | POA: Diagnosis not present

## 2023-04-14 NOTE — Patient Instructions (Signed)

## 2023-04-14 NOTE — Progress Notes (Signed)
 Phone [phone]   Subjective:   Patient is a 63 y.o. female presenting for annual physical.    Chief Complaint  Patient presents with   Annual Exam    CPE Not fasting    Annual-Good bye Lupus diet.  Not exercising d/t hip. Doing some upper body.  Graves - on methimazole.  See problem oriented charting- ROS- ROS: Gen: no fever, chills  Skin: no rash, itching ENT: no ear pain, ear drainage, nasal congestion, rhinorrhea, sinus pressure, sore throat Eyes: no blurry vision, double vision Resp: no cough, wheeze,SOB CV: no CP, palpitations, LE edema,  GI: no heartburn, n/v/d/c, abd pain GU: no dysuria, urgency, frequency, hematuria MSK: OA R hip-to be replaced but have to get thyroid controlled Neuro: no dizziness, headache, weakness, vertigo.  1 episode of syncope d/t THC gummies.  Psych: no depression, anxiety, insomnia, SI   The following were reviewed and entered/updated in epic: Past Medical History:  Diagnosis Date   Arthritis    Cancer (HCC)    Pre cancer skin   Graves disease 09/2018   Heart murmur    Patient Active Problem List   Diagnosis Date Noted   Unilateral primary osteoarthritis, right hip 12/16/2022   Flu-like symptoms 12/26/2021   Graves disease 03/29/2019   HSV infection 07/03/2011   Past Surgical History:  Procedure Laterality Date   COLONOSCOPY  05/08/2011   Dr.Brodie   DILATATION & CURETTAGE/HYSTEROSCOPY WITH MYOSURE N/A 01/01/2015   Procedure: DILATATION & CURETTAGE/HYSTEROSCOPY WITH MYOSURE;  Surgeon: Ok Edwards, MD;  Location: [facility];  Service: Gynecology;  Laterality: N/A;   pyloric stenosis  7557   37 month old   SMALL INTESTINE SURGERY  Pyloric stenosis age 68 months    Family History  Problem Relation Age of Onset   Hypertension Father    Cancer Father 90       pancreatic   Cancer Sister    Depression Brother    Mental illness Brother    Hypertension Brother    Colon cancer Paternal Uncle 42   Early death Maternal  Grandmother    Heart disease Maternal Grandmother    Hearing loss Maternal Grandfather    Early death Maternal Grandfather    Heart disease Maternal Grandfather    Hypertension Daughter    Kidney disease Son    ADD / ADHD Son    Anxiety disorder Son    Learning disabilities Son    ADD / ADHD Daughter    Depression Daughter    Anxiety disorder Son    Depression Son    Learning disabilities Son    Anxiety disorder Sister    Cancer Sister    Depression Sister    Anxiety disorder Brother    Depression Brother    Anxiety disorder Daughter    Learning disabilities Daughter    Asthma Sister    Asthma Brother    Obesity Maternal Aunt    Stomach cancer Neg Hx    Esophageal cancer Neg Hx    Colon polyps Neg Hx     Medications- reviewed and updated Current Outpatient Medications  Medication Sig Dispense Refill   acyclovir (ZOVIRAX) 200 MG capsule TAKE 5 TIMES DAILY FOR 3 TO 5 DAYS AS NEEDED 90 capsule 0   ALPRAZolam (XANAX) 0.25 MG tablet Take 1 tablet (0.25 mg total) by mouth at bedtime as needed. for sleep 30 tablet 0   Cholecalciferol (VITAMIN D3 PO) Take by mouth.     clobetasol cream (TEMOVATE) 0.05 % Apply  topically 2 (two) times daily as needed.     EFUDEX 5 % cream Apply topically. In the fall.     methimazole (TAPAZOLE) 5 MG tablet Take 2 tablets by mouth every other morning (Patient taking differently: daily. Take 2 tablets by mouth every morning) 180 tablet 1   Multiple Vitamin (MULTIVITAMIN PO) 1 tablet daily.     No current facility-administered medications for this visit.    Allergies-reviewed and updated No Known Allergies  Social History   Social History Narrative   Not on file   Objective  Objective:  BP 110/72   Pulse 66   Temp (!) 97.5 F (36.4 C) (Temporal)   Resp 18   Ht 5' 3.58" (1.615 m)   Wt 144 lb 6 oz (65.5 kg)   LMP 10/30/2014   SpO2 98%   BMI 25.11 kg/m  Physical Exam  Gen: WDWN NAD HEENT: NCAT, conjunctiva not injected, sclera  nonicteric TM WNL B, OP moist, no exudates  NECK:  supple, no thyromegaly, no nodes, no carotid bruits CARDIAC: RRR, S1S2+, no murmur. DP 2+B LUNGS: CTAB. No wheezes ABDOMEN:  BS+, soft, NTND, No HSM, no masses EXT:  no edema MSK: no gross abnormalities. MS 5/5 all 4 NEURO: A&O x3.  CN II-XII intact.  PSYCH: normal mood. Good eye contact     Assessment and Plan   Health Maintenance counseling: 1. Anticipatory guidance: Patient counseled regarding regular dental exams q6 months, eye exams,  avoiding smoking and second hand smoke, limiting alcohol to 1 beverage per day, no illicit drugs.   2. Risk factor reduction:  Advised patient of need for regular exercise and diet rich and fruits and vegetables to reduce risk of heart attack and stroke. Exercise- limited.  Wt Readings from Last 3 Encounters:  04/14/23 144 lb 6 oz (65.5 kg)  01/06/23 149 lb 12.8 oz (67.9 kg)  12/16/22 151 lb 9.6 oz (68.8 kg)   3. Immunizations/screenings/ancillary studies Immunization History  Administered Date(s) Administered   Influenza Inj Mdck Quad Pf 11/08/2016   Influenza, Seasonal, Injecte, Preservative Fre 10/08/2022   Influenza,inj,Quad PF,6+ Mos 11/24/2017, 11/29/2018, 12/20/2019, 11/20/2021   Influenza-Unspecified 01/21/2010, 01/07/2011, 02/10/2012, 10/15/2020   PFIZER(Purple Top)SARS-COV-2 Vaccination 03/25/2019, 04/15/2019, 12/29/2019   Pfizer Covid-19 Vaccine Bivalent Booster 53yrs & up 10/15/2020   Td 01/21/2010, 07/17/2011   Tdap 01/21/2010, 07/17/2011, 03/23/2022   Zoster Recombinant(Shingrix) 03/20/2021, 06/18/2021   There are no preventive care reminders to display for this patient.  4. Cervical cancer screening- utd 5. Breast cancer screening-  mammogram sch 6. Colon cancer screening - utd 7. Skin cancer screening- advised regular sunscreen use. Denies worrisome, changing, or new skin lesions.  8. Birth control/STD check- n/a 9. Osteoporosis screening- n/a 10. Smoking associated  screening - non smoker  Wellness examination   Annual-antic guidance.  Declines prevnar.  Graves-managed by endo R hip OA-awaiting surgery  Recommended follow up: Return in about 1 year (around 04/13/2024) for annual physical.  Lab/Order associations:n/a fasting  Angelena Sole, MD

## 2023-05-07 ENCOUNTER — Telehealth: Payer: Self-pay

## 2023-05-07 ENCOUNTER — Other Ambulatory Visit

## 2023-05-07 DIAGNOSIS — E0500 Thyrotoxicosis with diffuse goiter without thyrotoxic crisis or storm: Secondary | ICD-10-CM

## 2023-05-07 NOTE — Telephone Encounter (Signed)
 Orders Placed This Encounter  Procedures   T4, free   T3, free   TSH

## 2023-05-08 LAB — TSH: TSH: 0.78 m[IU]/L (ref 0.40–4.50)

## 2023-05-08 LAB — T3, FREE: T3, Free: 2.8 pg/mL (ref 2.3–4.2)

## 2023-05-08 LAB — T4, FREE: Free T4: 0.9 ng/dL (ref 0.8–1.8)

## 2023-05-10 ENCOUNTER — Encounter: Payer: Self-pay | Admitting: Internal Medicine

## 2023-05-10 DIAGNOSIS — L820 Inflamed seborrheic keratosis: Secondary | ICD-10-CM | POA: Diagnosis not present

## 2023-05-24 ENCOUNTER — Ambulatory Visit
Admission: RE | Admit: 2023-05-24 | Discharge: 2023-05-24 | Disposition: A | Discharge status: Home / Self Care | Source: Ambulatory Visit | Attending: Nurse Practitioner | Admitting: Nurse Practitioner

## 2023-05-24 DIAGNOSIS — Z1231 Encounter for screening mammogram for malignant neoplasm of breast: Secondary | ICD-10-CM

## 2023-06-13 ENCOUNTER — Other Ambulatory Visit: Payer: Self-pay | Admitting: Nurse Practitioner

## 2023-06-13 DIAGNOSIS — F4322 Adjustment disorder with anxiety: Secondary | ICD-10-CM

## 2023-06-14 NOTE — Telephone Encounter (Signed)
 Med refill request: Xanax  Last AEX: 04/20/2022-TW Next AEX: 06/15/2023-TW Last MMG (if hormonal med): n/a Refill authorized: rx pend.   Scheduled for tomorrow 06/15/2023.

## 2023-06-15 ENCOUNTER — Encounter: Payer: Self-pay | Admitting: Nurse Practitioner

## 2023-06-15 ENCOUNTER — Ambulatory Visit (INDEPENDENT_AMBULATORY_CARE_PROVIDER_SITE_OTHER): Admitting: Nurse Practitioner

## 2023-06-15 VITALS — BP 104/66 | HR 58 | Ht 62.25 in | Wt 146.2 lb

## 2023-06-15 DIAGNOSIS — E559 Vitamin D deficiency, unspecified: Secondary | ICD-10-CM | POA: Diagnosis not present

## 2023-06-15 DIAGNOSIS — Z01419 Encounter for gynecological examination (general) (routine) without abnormal findings: Secondary | ICD-10-CM

## 2023-06-15 DIAGNOSIS — Z1331 Encounter for screening for depression: Secondary | ICD-10-CM

## 2023-06-15 DIAGNOSIS — Z780 Asymptomatic menopausal state: Secondary | ICD-10-CM

## 2023-06-15 DIAGNOSIS — E785 Hyperlipidemia, unspecified: Secondary | ICD-10-CM

## 2023-06-15 NOTE — Progress Notes (Signed)
 CHEROLYN BEHRLE July 18, 1960 [id number]   History:  63 y.o. G4 P4 presents for annual exam without GYN complaints. Postmenopausal - no HRT, no bleeding. Normal pap and mammogram history. HSV,  Acyclovir  for outbreaks. Xanax  occasionally for insomnia and anxiety. History of Graves disease, followed by endocrinology. Has started an anti-inflammatory diet and levels are normal.   Gynecologic History Patient's last menstrual period was 10/30/2014.   Contraception: post menopausal status Sexually active: No  Health maintenance Last Pap: 04/02/2021. Results were: Normal neg HPV Last mammogram: 05/24/2023. Results were: Normal Last colonoscopy: 05/30/2021. Results were: Tubular adenoma, 8-year recall Last Dexa: 05/21/2021. Results were: Normal     06/15/2023    3:00 PM  Depression screen PHQ 2/9  Decreased Interest 0  Down, Depressed, Hopeless 0  PHQ - 2 Score 0     Past medical history, past surgical history, family history and social history were all reviewed and documented in the EPIC chart. Single. Works remote in Occupational hygienist. 4 children (2 sons, 2 daughters) ages 41-32. Son is getting married this year.   ROS:  A ROS was performed and pertinent positives and negatives are included.  Exam:  Vitals:   06/15/23 1502  BP: 104/66  Pulse: (!) 58  SpO2: 96%  Weight: 146 lb 3.2 oz (66.3 kg)  Height: 5' 2.25" (1.581 m)      Body mass index is 26.53 kg/m.  General appearance:  Normal Thyroid :  Symmetrical, normal in size, without palpable masses or nodularity. Respiratory  Auscultation:  Clear without wheezing or rhonchi Cardiovascular  Auscultation:  Regular rate, without rubs, murmurs or gallops  Edema/varicosities:  Not grossly evident Abdominal  Soft,nontender, without masses, guarding or rebound.  Liver/spleen:  No organomegaly noted  Hernia:  None appreciated  Skin  Inspection:  Grossly normal   Breasts: Examined lying and sitting.   Right: Without masses,  retractions, discharge or axillary adenopathy.   Left: Without masses, retractions, discharge or axillary adenopathy. Pelvic: External genitalia:  no lesions              Urethra:  normal appearing urethra with no masses, tenderness or lesions              Bartholins and Skenes: normal                 Vagina: normal appearing vagina with normal color and discharge, no lesions              Cervix: no lesions Bimanual Exam:  Uterus:  no masses or tenderness              Adnexa: no mass, fullness, tenderness              Rectovaginal: Deferred              Anus:  normal, no lesions  Patient informed chaperone available to be present for breast and pelvic exam. Patient has requested no chaperone to be present. Patient has been advised what will be completed during breast and pelvic exam.   Assessment/Plan:  63 y.o. G4 P4 for annual exam.    Well female exam with routine gynecological exam - CBC with Differential/Platelet, Comprehensive metabolic panel. Education provided on SBEs, importance of preventative screenings, current guidelines, high calcium diet, regular exercise, and multivitamin daily. Will return for fasting labs.   Hyperlipidemia, unspecified hyperlipidemia type - Plan: Lipid panel  Postmenopausal - No HRT, no bleeding.   HSV (herpes simplex virus) infection - takes Acyclovir  as  needed. Rare outbreaks. Does not need refills at this time.   Vitamin D  deficiency - Plan: VITAMIN D  25 Hydroxy (Vit-D Deficiency, Fractures)  Screening for cervical cancer - Normal Pap history. Will repeat at 5-year interval per guidelines.   Screening for breast cancer - Normal mammogram history. Continue annual screenings.  Normal breast exam today.  Screening for colon cancer - 05/2021 colonoscopy. Will repeat at GI's recommended interval.   Screening for osteoporosis - Normal bone density 04/2021. Will repeat at 5-year interval per recommendation.   Return in about 1 year (around 06/14/2024) for  Annual.    Andee Bamberger [facility], 3:49 PM 06/15/2023

## 2023-06-16 DIAGNOSIS — D225 Melanocytic nevi of trunk: Secondary | ICD-10-CM | POA: Diagnosis not present

## 2023-06-16 DIAGNOSIS — L821 Other seborrheic keratosis: Secondary | ICD-10-CM | POA: Diagnosis not present

## 2023-06-16 DIAGNOSIS — L920 Granuloma annulare: Secondary | ICD-10-CM | POA: Diagnosis not present

## 2023-06-16 DIAGNOSIS — Z85828 Personal history of other malignant neoplasm of skin: Secondary | ICD-10-CM | POA: Diagnosis not present

## 2023-06-18 ENCOUNTER — Other Ambulatory Visit

## 2023-06-18 DIAGNOSIS — E559 Vitamin D deficiency, unspecified: Secondary | ICD-10-CM

## 2023-06-18 DIAGNOSIS — Z01419 Encounter for gynecological examination (general) (routine) without abnormal findings: Secondary | ICD-10-CM

## 2023-06-18 DIAGNOSIS — E785 Hyperlipidemia, unspecified: Secondary | ICD-10-CM

## 2023-06-19 LAB — CBC WITH DIFFERENTIAL/PLATELET
Absolute Lymphocytes: 1268 {cells}/uL (ref 850–3900)
Absolute Monocytes: 328 {cells}/uL (ref 200–950)
Basophils Absolute: 88 {cells}/uL (ref 0–200)
Basophils Relative: 2.2 %
Eosinophils Absolute: 188 {cells}/uL (ref 15–500)
Eosinophils Relative: 4.7 %
HCT: 40.9 % (ref 35.0–45.0)
Hemoglobin: 13.7 g/dL (ref 11.7–15.5)
MCH: 32.5 pg (ref 27.0–33.0)
MCHC: 33.5 g/dL (ref 32.0–36.0)
MCV: 96.9 fL (ref 80.0–100.0)
MPV: 9.9 fL (ref 7.5–12.5)
Monocytes Relative: 8.2 %
Neutro Abs: 2128 {cells}/uL (ref 1500–7800)
Neutrophils Relative %: 53.2 %
Platelets: 382 10*3/uL (ref 140–400)
RBC: 4.22 10*6/uL (ref 3.80–5.10)
RDW: 12.7 % (ref 11.0–15.0)
Total Lymphocyte: 31.7 %
WBC: 4 10*3/uL (ref 3.8–10.8)

## 2023-06-19 LAB — COMPREHENSIVE METABOLIC PANEL WITH GFR
AG Ratio: 1.8 (calc) (ref 1.0–2.5)
ALT: 15 U/L (ref 6–29)
AST: 18 U/L (ref 10–35)
Albumin: 4.5 g/dL (ref 3.6–5.1)
Alkaline phosphatase (APISO): 75 U/L (ref 37–153)
BUN/Creatinine Ratio: 8 (calc) (ref 6–22)
BUN: 5 mg/dL — ABNORMAL LOW (ref 7–25)
CO2: 27 mmol/L (ref 20–32)
Calcium: 9.8 mg/dL (ref 8.6–10.4)
Chloride: 102 mmol/L (ref 98–110)
Creat: 0.64 mg/dL (ref 0.50–1.05)
Globulin: 2.5 g/dL (ref 1.9–3.7)
Glucose, Bld: 92 mg/dL (ref 65–99)
Potassium: 4.8 mmol/L (ref 3.5–5.3)
Sodium: 138 mmol/L (ref 135–146)
Total Bilirubin: 0.7 mg/dL (ref 0.2–1.2)
Total Protein: 7 g/dL (ref 6.1–8.1)
eGFR: 99 mL/min/{1.73_m2} (ref >=60)

## 2023-06-19 LAB — LIPID PANEL
Cholesterol: 175 mg/dL (ref <200)
HDL: 77 mg/dL (ref >=50)
LDL Cholesterol (Calc): 84 mg/dL
Non-HDL Cholesterol (Calc): 98 mg/dL (ref <130)
Total CHOL/HDL Ratio: 2.3 (calc) (ref <5.0)
Triglycerides: 64 mg/dL (ref <150)

## 2023-06-19 LAB — VITAMIN D 25 HYDROXY (VIT D DEFICIENCY, FRACTURES): Vit D, 25-Hydroxy: 32 ng/mL (ref 30–100)

## 2023-06-21 ENCOUNTER — Ambulatory Visit: Payer: Self-pay | Admitting: Obstetrics and Gynecology

## 2023-07-08 ENCOUNTER — Ambulatory Visit: Payer: BC Managed Care – PPO | Admitting: Internal Medicine

## 2023-07-08 ENCOUNTER — Encounter: Payer: Self-pay | Admitting: Internal Medicine

## 2023-07-08 VITALS — BP 120/78 | HR 68 | Ht 62.25 in | Wt 145.0 lb

## 2023-07-08 DIAGNOSIS — E0500 Thyrotoxicosis with diffuse goiter without thyrotoxic crisis or storm: Secondary | ICD-10-CM | POA: Diagnosis not present

## 2023-07-08 DIAGNOSIS — R635 Abnormal weight gain: Secondary | ICD-10-CM | POA: Diagnosis not present

## 2023-07-08 NOTE — Progress Notes (Addendum)
 Patient ID: Sarah Jimenez, female   DOB: [birth year], 63 y.o.   MRN: [id number]   HPI  Sarah Jimenez is a 63 y.o.-year-old female, initially referred by her gastroenterologist, Dr. Savannah Curlin, returning for follow-up for Graves' disease. Last visit 6 months ago.  Interim history: She denies tremors, palpitations, insomnia, anxiety. Fatigue resolved.  She feels great except for right hip pain (osteoarthritis).  She is still considering the THR, although after improving her diet (a plant-based, anti-inflammatory), her pain improved.  Reviewed and addended history: Patient was found to have an undetectable TSH during investigation of diarrhea, which started in 04/2017.  Previous TSH levels from 2017 and 2014 were normal.  Reviewed her TFTs: Lab Results  Component Value Date   TSH 0.78 05/07/2023   TSH 0.04 (L) 03/31/2023   TSH 0.01 (L) 02/17/2023   TSH 0.01 (L) 01/06/2023   TSH 0.11 (L) 11/11/2022   TSH 7.64 (H) 10/01/2022   TSH 0.01 (L) 08/13/2022   TSH <0.01 (L) 07/07/2022   TSH 2.83 02/11/2022   TSH 7.09 (H) 01/05/2022   FREET4 0.9 05/07/2023   FREET4 1.0 03/31/2023   FREET4 1.5 02/17/2023   FREET4 1.7 01/06/2023   FREET4 0.98 11/11/2022   FREET4 0.54 (L) 10/01/2022   FREET4 0.96 08/13/2022   FREET4 1.42 07/07/2022   FREET4 0.77 02/11/2022   FREET4 0.59 (L) 01/05/2022   T3FREE 2.8 05/07/2023   T3FREE 3.0 03/31/2023   T3FREE 4.6 (H) 02/17/2023   T3FREE 5.5 (H) 01/06/2023   T3FREE 4.1 11/11/2022   T3FREE 3.7 10/01/2022   T3FREE 4.5 (H) 08/13/2022   T3FREE 4.6 (H) 07/07/2022   T3FREE 3.5 02/11/2022   T3FREE 3.2 01/05/2022   Her TSI antibodies were elevated at last check: Lab Results  Component Value Date   TSI 276 (H) 01/06/2023   TSI 112 08/16/2018   Thyroid  uptake and scan (10/13/2018): Uptake at 4 hours 29.4%, uptake in 24 hours, 42.5%, both elevated.  Uniform scan, consistent with Graves' disease.  08/2018: We started methimazole  (MMI), initially 5 mg  daily. 05/2018: MMI increase to 5 mg 2x a day 10/2018: MMI decrease to 5 mg in a.m. and 2.5 mg in p.m. 11/2018: She increased MMI dose to 5 mg 2x a day 03/2019: MMI decreased to 5 mg daily 05/2019: MMI decreased to 2.5 mg daily 08/2019: she stopped MMI by herself 10/2019: MMI restarted at 5 mg daily 12/2020: MMI increase to 7.5 mg daily 07/2021: MMI increased to 5 mg 2x a day (could not tolerated 10 mg in am and 5 mg in pm - AP) 12/2021: MMI decreased to 5 mg daily 06/2022: MMI increased to 5 mg 2x a day 09/2022: MMI decreased to 5 mg daily 10/2022: MMI increased to 5 mg alternating with 10 mg every other day 12/2022: MMI increased to 10 mg daily  Pt denies: - feeling nodules in neck - hoarseness - dysphagia - choking  She initially had the following symptoms: Diarrhea, which improved after changing her diet and cutting out meat and dairy.   Occasional palpitations Fatigue Insomnia Tremors The symptoms have resolved.  No FH of thyroid  disease or thyroid  cancer. No h/o radiation tx to head or neck. No Biotin use. No recent steroids use.  Latest steroid injection in hip was a year ago  + CBD gummieat night - helps her sleep. Of note, a DXA scan was normal in 2017. 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 She also has a history of frozen  shoulders.  ROS: + see HPI  I reviewed pt's medications, allergies, PMH, social hx, family hx, and changes were documented in the history of present illness. Otherwise, unchanged from my initial visit note.  Past Medical History:  Diagnosis Date   Arthritis    Cancer (HCC)    Pre cancer skin   Graves disease 09/2018   Heart murmur    Osteoarthritis    right hip   Past Surgical History:  Procedure Laterality Date   COLONOSCOPY  05/08/2011   Dr.Brodie   DILATATION & CURETTAGE/HYSTEROSCOPY WITH MYOSURE N/A 01/01/2015   Procedure: DILATATION & CURETTAGE/HYSTEROSCOPY WITH MYOSURE;  Surgeon: Davia Erps, MD;  Location: [facility];  Service: Gynecology;  Laterality: N/A;   pyloric stenosis  6267   48 month old   SMALL INTESTINE SURGERY  Pyloric stenosis age 68 months   Social History   Socioeconomic History   Marital status: Single    Spouse name: Not on file   Number of children: 4   Years of education: Not on file   Highest education level: Not on file  Occupational History    Communication coordinator  Tobacco Use   Smoking status: Never Smoker   Smokeless tobacco: Never Used  Substance and Sexual Activity   Alcohol use: Yes    Alcohol/week: 2.0 standard drinks    Types: 2 Glasses of wine per week    Comment: LITTLE   Drug use: Yes    Comment: Edible Marijuana tried while in [place]    Sexual activity: Not Currently    Birth control/protection: Other-see comments    Comment: vasectomy, INTERCOURSE AGE 64, SEXUAL PARTNERS MORE THAN 5   Current Outpatient Medications on File Prior to Visit  Medication Sig Dispense Refill   acyclovir  (ZOVIRAX ) 200 MG capsule TAKE 5 TIMES DAILY FOR 3 TO 5 DAYS AS NEEDED 90 capsule 0   ALPRAZolam  (XANAX ) 0.25 MG tablet TAKE 1 TABLET (0.25 MG TOTAL) BY MOUTH AT BEDTIME AS NEEDED. FOR SLEEP 30 tablet 0   Cholecalciferol (VITAMIN D3 PO) Take by mouth.     clobetasol cream (TEMOVATE) 0.05 % Apply topically 2 (two) times daily as needed.     EFUDEX 5 % cream Apply topically. In the fall.     methimazole  (TAPAZOLE ) 5 MG tablet Take 2 tablets by mouth every other morning (Patient taking differently: daily. Take 2 tablets by mouth every morning) 180 tablet 1   Multiple Vitamin (MULTIVITAMIN PO) 1 tablet daily.     No current facility-administered medications on file prior to visit.   No Known Allergies Family History  Problem Relation Age of Onset   Dementia Mother    Hypertension Father    Cancer Father 73       pancreatic   Cancer Sister    Anxiety disorder Sister    Cancer Sister    Depression Sister    Asthma Sister    Depression Brother    Mental illness  Brother    Hypertension Brother    Anxiety disorder Brother    Depression Brother    Asthma Brother    Hypertension Daughter    ADD / ADHD Daughter    Depression Daughter    Anxiety disorder Daughter    Learning disabilities Daughter    Kidney disease Son    ADD / ADHD Son    Anxiety disorder Son    Learning disabilities Son    Anxiety disorder Son    Depression Son    Learning disabilities  Son    Obesity Maternal Aunt    Colon cancer Paternal Uncle 47   Early death Maternal Grandmother    Heart disease Maternal Grandmother    Hearing loss Maternal Grandfather    Early death Maternal Grandfather    Heart disease Maternal Grandfather    Stomach cancer Neg Hx    Esophageal cancer Neg Hx    Colon polyps Neg Hx    PE: BP 120/78   Pulse 68   Ht 5' 2.25 (1.581 m)   Wt 145 lb (65.8 kg)   LMP 10/30/2014   SpO2 97%   BMI 26.31 kg/m  Wt Readings from Last 10 Encounters:  07/08/23 145 lb (65.8 kg)  06/15/23 146 lb 3.2 oz (66.3 kg)  04/14/23 144 lb 6 oz (65.5 kg)  01/06/23 149 lb 12.8 oz (67.9 kg)  12/16/22 151 lb 9.6 oz (68.8 kg)  04/20/22 149 lb (67.6 kg)  03/23/22 149 lb 6.4 oz (67.8 kg)  01/05/22 148 lb 6.4 oz (67.3 kg)  12/26/21 145 lb 3.2 oz (65.9 kg)  05/30/21 149 lb (67.6 kg)   Constitutional: normal weight, in NAD Eyes:  EOMI, no exophthalmos, ENT: no neck masses, no cervical lymphadenopathy Cardiovascular: RRR, No MRG Respiratory: CTA B Musculoskeletal: no deformities Skin:no rashes except erythema annularis on feet Neurological: no tremor with outstretched hands  ASSESSMENT: 1.  Graves' disease  2. Weight gain  PLAN:  1. Patient with being found thyrotoxic and symptomatic with diarrhea, palpitations, but without weight loss, heat intolerance, significant anxiety.  She was started on methimazole  and the dose was subsequently adjusted.  At last visit, we had to increase the dose further to 10 mg daily as the TSH was suppressed at 0.01.  Her TSI antibodies  were also elevated.  Repeat set of TFTs showed only slight improvement in her TSH.  She was trying to improve her diet at that time.  We discussed about repeating the TFTs and, indeed, TSH normalized in 04/2023. - She denies thyrotoxic signs or symptoms.  She feels great.  Her right hip pain has also improved.  Her white blood cell count is low normal, which is excellent. - She does not have signs of active Graves' ophthalmopathy: Blurry vision, double vision, eye pain, chemosis - at last visit and again today we discussed about the variability in her TFTs.  I again recommended definitive treatment: RAI ablation or surgery.  At last visit she mentions that she wanted to wait until her hip replacement surgery was completed and afterwards she would consider RAI treatment.  We discussed about outcomes with the vast majority of the patients becoming hypothyroid and needing levothyroxine for life.  Discussed about consequences of uncontrolled hypothyroidism.  However, I explained that hypothyroidism is much easier to manage compared to hyperthyroidism.  At today's visit, she is thinking about a right hip replacement.  Since the pain improved after improving diet, she is considering whether to have the surgery or not, but feels that she may benefit from it as she is not quite able to exercise right now - At today's visit we will recheck her TFTs and change the methimazole  dose accordingly - I will see her back in 6 months  2.  Weight gain -She was able to lose approximately 30 pounds on a more plant-based diet in the past, and she resumed this after last visit -Weight was approximately stable at last visit and she lost 3 pounds since then -At today's visit, lost another 3 pounds since  last visit  Needs refills.  Component     Latest Ref Rng 07/08/2023  TSH     0.40 - 4.50 mIU/L 1.93   Triiodothyronine,Free,Serum     2.3 - 4.2 pg/mL 3.0   T4,Free(Direct)     0.8 - 1.8 ng/dL 1.0   Normal.  For now, we  will continue the same dose of methimazole .  Emilie Harden, MD PhD [facility]

## 2023-07-08 NOTE — Patient Instructions (Signed)
 Please stop at the lab.  Please continue Methimazole  10 mg daily.  Please come back for a follow-up appointment in 6 months, but likely sooner for labs

## 2023-07-09 ENCOUNTER — Ambulatory Visit: Payer: Self-pay | Admitting: Internal Medicine

## 2023-07-09 DIAGNOSIS — E2839 Other primary ovarian failure: Secondary | ICD-10-CM

## 2023-07-09 LAB — T4, FREE: Free T4: 1 ng/dL (ref 0.8–1.8)

## 2023-07-09 LAB — TSH: TSH: 1.93 m[IU]/L (ref 0.40–4.50)

## 2023-07-09 LAB — T3, FREE: T3, Free: 3 pg/mL (ref 2.3–4.2)

## 2023-07-09 MED ORDER — METHIMAZOLE 5 MG PO TABS
10.0000 mg | ORAL_TABLET | Freq: Every day | ORAL | 1 refills | Status: DC
Start: 2023-07-09 — End: 2023-09-07

## 2023-07-09 NOTE — Addendum Note (Signed)
 Addended by: Emilie Harden on: 07/09/2023 03:36 PM   Modules accepted: Orders

## 2023-07-28 ENCOUNTER — Ambulatory Visit (INDEPENDENT_AMBULATORY_CARE_PROVIDER_SITE_OTHER)

## 2023-07-28 DIAGNOSIS — Z23 Encounter for immunization: Secondary | ICD-10-CM | POA: Diagnosis not present

## 2023-08-11 ENCOUNTER — Encounter: Payer: Self-pay | Admitting: Obstetrics and Gynecology

## 2023-08-24 ENCOUNTER — Other Ambulatory Visit: Payer: Self-pay | Admitting: Physician Assistant

## 2023-08-24 DIAGNOSIS — Z01818 Encounter for other preprocedural examination: Secondary | ICD-10-CM

## 2023-08-25 ENCOUNTER — Ambulatory Visit: Admitting: Orthopaedic Surgery

## 2023-08-25 ENCOUNTER — Other Ambulatory Visit (INDEPENDENT_AMBULATORY_CARE_PROVIDER_SITE_OTHER): Payer: Self-pay

## 2023-08-25 ENCOUNTER — Encounter: Payer: Self-pay | Admitting: Orthopaedic Surgery

## 2023-08-25 DIAGNOSIS — M1611 Unilateral primary osteoarthritis, right hip: Secondary | ICD-10-CM

## 2023-08-25 NOTE — Progress Notes (Signed)
 The patient is a 63 year old female well-known to us .  She is scheduled for a right total hip arthroplasty on August 15.  We have not seen her in a while so we wanted to make sure medically she is doing well.  She is not a diabetic.  She is a thin individual.  Her thyroid  disease has improved with medications.  She has a previous MRI showing significant arthritis of the right hip with subchondral edema and cystic changes in the acetabular and femoral head.  New plain films today of the pelvis shows increased joint space narrowing of the right hip.  On exam her right hip is very painful and stiff and we cannot fully rotate her right hip.  The left hip exam is entirely normal.  We had a long thorough discussion again about hip replacement surgery.  We discussed what to expect from an intraoperative and postoperative standpoint and the risk and benefits of surgery.  Again she has already been scheduled for the surgery so we will see her the day of surgery for a right anterior total hip arthroplasty.  All question concerns were addressed and answered.

## 2023-08-27 NOTE — Patient Instructions (Addendum)
 SURGICAL WAITING ROOM VISITATION Patients having surgery or a procedure may have no more than 2 support people in the waiting area - these visitors may rotate.    Children under the age of 11 must have an adult with them who is not the patient.  If the patient needs to stay at the hospital during part of their recovery, the visitor guidelines for inpatient rooms apply. Pre-op nurse will coordinate an appropriate time for 1 support person to accompany patient in pre-op.  This support person may not rotate.    Please refer to the [facility] website for the visitor guidelines for Inpatients (after your surgery is over and you are in a regular room).       Your procedure is scheduled on: 09-10-23   Report to [facility] Main Entrance    Report to admitting at 5:15 AM   Call this number if you have problems the morning of surgery [phone]   Do not eat food :After Midnight.   After Midnight you may have the following liquids until 4:15 AM DAY OF SURGERY  Water Non-Citrus Juices (without pulp, NO RED-Apple, White grape, White cranberry) Black Coffee (NO MILK/CREAM OR CREAMERS, sugar ok)  Clear Tea (NO MILK/CREAM OR CREAMERS, sugar ok) regular and decaf                             Plain Jell-O (NO RED)                                           Fruit ices (not with fruit pulp, NO RED)                                     Popsicles (NO RED)                                                               Sports drinks like Gatorade (NO RED)                   The day of surgery:  Drink ONE (1) Pre-Surgery Clear Ensure by 4:15 AM the morning of surgery. Drink in one sitting. Do not sip.  This drink was given to you during your hospital  pre-op appointment visit. Nothing else to drink after completing the Pre-Surgery Clear Ensure          If you have questions, please contact your surgeon's office.   FOLLOW  ANY ADDITIONAL PRE OP INSTRUCTIONS YOU RECEIVED FROM YOUR SURGEON'S  OFFICE!!!     Oral Hygiene is also important to reduce your risk of infection.                                    Remember - BRUSH YOUR TEETH THE MORNING OF SURGERY WITH YOUR REGULAR TOOTHPASTE   Do NOT smoke after Midnight   Take these medicines the morning of surgery with A SIP OF WATER:    Acyclovir    Methimazole   Stop all vitamins and herbal supplements 7 days before surgery  Bring CPAP mask and tubing day of surgery.                              You may not have any metal on your body including hair pins, jewelry, and body piercing             Do not wear make-up, lotions, powders, perfumes or deodorant  Do not wear nail polish including gel and S&S, artificial/acrylic nails, or any other type of covering on natural nails including finger and toenails. If you have artificial nails, gel coating, etc. that needs to be removed by a nail salon please have this removed prior to surgery or surgery may need to be canceled/ delayed if the surgeon/ anesthesia feels like they are unable to be safely monitored.   Do not shave  48 hours prior to surgery.           Do not bring valuables to the hospital. [facility] IS NOT RESPONSIBLE   FOR VALUABLES.   Contacts, dentures or bridgework may not be worn into surgery.   Bring small overnight bag day of surgery.   DO NOT BRING YOUR HOME MEDICATIONS TO THE HOSPITAL. PHARMACY WILL DISPENSE MEDICATIONS LISTED ON YOUR MEDICATION LIST TO YOU DURING YOUR ADMISSION IN THE HOSPITAL!    Special Instructions: Bring a copy of your healthcare power of attorney and living will documents the day of surgery if you haven't scanned them before.              Please read over the following fact sheets you were given: IF YOU HAVE QUESTIONS ABOUT YOUR PRE-OP INSTRUCTIONS PLEASE CALL [phone] Gwen  If you received a COVID test during your pre-op visit  it is requested that you wear a mask when out in public, stay away from anyone that may not be feeling well  and notify your surgeon if you develop symptoms. If you test positive for Covid or have been in contact with anyone that has tested positive in the last 10 days please notify you surgeon.   Pre-operative 5 CHG Bath Instructions   You can play a key role in reducing the risk of infection after surgery. Your skin needs to be as free of germs as possible. You can reduce the number of germs on your skin by washing with CHG (chlorhexidine  gluconate) soap before surgery. CHG is an antiseptic soap that kills germs and continues to kill germs even after washing.   DO NOT use if you have an allergy to chlorhexidine /CHG or antibacterial soaps. If your skin becomes reddened or irritated, stop using the CHG and notify one of our RNs at [phone].   Please shower with the CHG soap starting 4 days before surgery using the following schedule:     Please keep in mind the following:  DO NOT shave, including legs and underarms, starting the day of your first shower.   You may shave your face at any point before/day of surgery.  Place clean sheets on your bed the day you start using CHG soap. Use a clean washcloth (not used since being washed) for each shower. DO NOT sleep with pets once you start using the CHG.   CHG Shower Instructions:  If you choose to wash your hair and private area, wash first with your normal shampoo/soap.  After you use shampoo/soap, rinse your hair and body  thoroughly to remove shampoo/soap residue.  Turn the water OFF and apply about 3 tablespoons (45 ml) of CHG soap to a CLEAN washcloth.  Apply CHG soap ONLY FROM YOUR NECK DOWN TO YOUR TOES (washing for 3-5 minutes)  DO NOT use CHG soap on face, private areas, open wounds, or sores.  Pay special attention to the area where your surgery is being performed.  If you are having back surgery, having someone wash your back for you may be helpful. Wait 2 minutes after CHG soap is applied, then you may rinse off the CHG soap.  Pat  dry with a clean towel  Put on clean clothes/pajamas   If you choose to wear lotion, please use ONLY the CHG-compatible lotions on the back of this paper.     Additional instructions for the day of surgery: DO NOT APPLY any lotions, deodorants, cologne, or perfumes.   Put on clean/comfortable clothes.  Brush your teeth.  Ask your nurse before applying any prescription medications to the skin.      CHG Compatible Lotions   Aveeno Moisturizing lotion  Cetaphil Moisturizing Cream  Cetaphil Moisturizing Lotion  Clairol Herbal Essence Moisturizing Lotion, Dry Skin  Clairol Herbal Essence Moisturizing Lotion, Extra Dry Skin  Clairol Herbal Essence Moisturizing Lotion, Normal Skin  Curel Age Defying Therapeutic Moisturizing Lotion with Alpha Hydroxy  Curel Extreme Care Body Lotion  Curel Soothing Hands Moisturizing Hand Lotion  Curel Therapeutic Moisturizing Cream, Fragrance-Free  Curel Therapeutic Moisturizing Lotion, Fragrance-Free  Curel Therapeutic Moisturizing Lotion, Original Formula  Eucerin Daily Replenishing Lotion  Eucerin Dry Skin Therapy Plus Alpha Hydroxy Crme  Eucerin Dry Skin Therapy Plus Alpha Hydroxy Lotion  Eucerin Original Crme  Eucerin Original Lotion  Eucerin Plus Crme Eucerin Plus Lotion  Eucerin TriLipid Replenishing Lotion  Keri Anti-Bacterial Hand Lotion  Keri Deep Conditioning Original Lotion Dry Skin Formula Softly Scented  Keri Deep Conditioning Original Lotion, Fragrance Free Sensitive Skin Formula  Keri Lotion Fast Absorbing Fragrance Free Sensitive Skin Formula  Keri Lotion Fast Absorbing Softly Scented Dry Skin Formula  Keri Original Lotion  Keri Skin Renewal Lotion Keri Silky Smooth Lotion  Keri Silky Smooth Sensitive Skin Lotion  Nivea Body Creamy Conditioning Oil  Nivea Body Extra Enriched Lotion  Nivea Body Original Lotion  Nivea Body Sheer Moisturizing Lotion Nivea Crme  Nivea Skin Firming Lotion  NutraDerm 30 Skin Lotion  NutraDerm  Skin Lotion  NutraDerm Therapeutic Skin Cream  NutraDerm Therapeutic Skin Lotion  ProShield Protective Hand Cream  Provon moisturizing lotion   PATIENT SIGNATURE_________________________________  NURSE SIGNATURE__________________________________  ________________________________________________________________________    Nasario Exon  An incentive spirometer is a tool that can help keep your lungs clear and active. This tool measures how well you are filling your lungs with each breath. Taking long deep breaths may help reverse or decrease the chance of developing breathing (pulmonary) problems (especially infection) following: A long period of time when you are unable to move or be active. BEFORE THE PROCEDURE  If the spirometer includes an indicator to show your best effort, your nurse or respiratory therapist will set it to a desired goal. If possible, sit up straight or lean slightly forward. Try not to slouch. Hold the incentive spirometer in an upright position. INSTRUCTIONS FOR USE  Sit on the edge of your bed if possible, or sit up as far as you can in bed or on a chair. Hold the incentive spirometer in an upright position. Breathe out normally. Place the mouthpiece in your mouth  and seal your lips tightly around it. Breathe in slowly and as deeply as possible, raising the piston or the ball toward the top of the column. Hold your breath for 3-5 seconds or for as long as possible. Allow the piston or ball to fall to the bottom of the column. Remove the mouthpiece from your mouth and breathe out normally. Rest for a few seconds and repeat Steps 1 through 7 at least 10 times every 1-2 hours when you are awake. Take your time and take a few normal breaths between deep breaths. The spirometer may include an indicator to show your best effort. Use the indicator as a goal to work toward during each repetition. After each set of 10 deep breaths, practice coughing to be sure  your lungs are clear. If you have an incision (the cut made at the time of surgery), support your incision when coughing by placing a pillow or rolled up towels firmly against it. Once you are able to get out of bed, walk around indoors and cough well. You may stop using the incentive spirometer when instructed by your caregiver.  RISKS AND COMPLICATIONS Take your time so you do not get dizzy or light-headed. If you are in pain, you may need to take or ask for pain medication before doing incentive spirometry. It is harder to take a deep breath if you are having pain. AFTER USE Rest and breathe slowly and easily. It can be helpful to keep track of a log of your progress. Your caregiver can provide you with a simple table to help with this. If you are using the spirometer at home, follow these instructions: SEEK MEDICAL CARE IF:  You are having difficultly using the spirometer. You have trouble using the spirometer as often as instructed. Your pain medication is not giving enough relief while using the spirometer. You develop fever of 100.5 F (38.1 C) or higher. SEEK IMMEDIATE MEDICAL CARE IF:  You cough up bloody sputum that had not been present before. You develop fever of 102 F (38.9 C) or greater. You develop worsening pain at or near the incision site. MAKE SURE YOU:  Understand these instructions. Will watch your condition. Will get help right away if you are not doing well or get worse. Document Released: 05/25/2006 Document Revised: 04/06/2011 Document Reviewed: 07/26/2006 ExitCare Patient Information 2014 [employer], [place].   ________________________________________________________________________ WHAT IS A BLOOD TRANSFUSION? Blood Transfusion Information  A transfusion is the replacement of blood or some of its parts. Blood is made up of multiple cells which provide different functions. Red blood cells carry oxygen and are used for blood loss replacement. White blood cells  fight against infection. Platelets control bleeding. Plasma helps clot blood. Other blood products are available for specialized needs, such as hemophilia or other clotting disorders. BEFORE THE TRANSFUSION  Who gives blood for transfusions?  Healthy volunteers who are fully evaluated to make sure their blood is safe. This is blood bank blood. Transfusion therapy is the safest it has ever been in the practice of medicine. Before blood is taken from a donor, a complete history is taken to make sure that person has no history of diseases nor engages in risky social behavior (examples are intravenous drug use or sexual activity with multiple partners). The donor's travel history is screened to minimize risk of transmitting infections, such as malaria. The donated blood is tested for signs of infectious diseases, such as HIV and hepatitis. The blood is then tested to be sure  it is compatible with you in order to minimize the chance of a transfusion reaction. If you or a relative donates blood, this is often done in anticipation of surgery and is not appropriate for emergency situations. It takes many days to process the donated blood. RISKS AND COMPLICATIONS Although transfusion therapy is very safe and saves many lives, the main dangers of transfusion include:  Getting an infectious disease. Developing a transfusion reaction. This is an allergic reaction to something in the blood you were given. Every precaution is taken to prevent this. The decision to have a blood transfusion has been considered carefully by your caregiver before blood is given. Blood is not given unless the benefits outweigh the risks. AFTER THE TRANSFUSION Right after receiving a blood transfusion, you will usually feel much better and more energetic. This is especially true if your red blood cells have gotten low (anemic). The transfusion raises the level of the red blood cells which carry oxygen, and this usually causes an energy  increase. The nurse administering the transfusion will monitor you carefully for complications. HOME CARE INSTRUCTIONS  No special instructions are needed after a transfusion. You may find your energy is better. Speak with your caregiver about any limitations on activity for underlying diseases you may have. SEEK MEDICAL CARE IF:  Your condition is not improving after your transfusion. You develop redness or irritation at the intravenous (IV) site. SEEK IMMEDIATE MEDICAL CARE IF:  Any of the following symptoms occur over the next 12 hours: Shaking chills. You have a temperature by mouth above 102 F (38.9 C), not controlled by medicine. Chest, back, or muscle pain. People around you feel you are not acting correctly or are confused. Shortness of breath or difficulty breathing. Dizziness and fainting. You get a rash or develop hives. You have a decrease in urine output. Your urine turns a dark color or changes to pink, red, or brown. Any of the following symptoms occur over the next 10 days: You have a temperature by mouth above 102 F (38.9 C), not controlled by medicine. Shortness of breath. Weakness after normal activity. The white part of the eye turns yellow (jaundice). You have a decrease in the amount of urine or are urinating less often. Your urine turns a dark color or changes to pink, red, or brown. Document Released: 01/10/2000 Document Revised: 04/06/2011 Document Reviewed: 08/29/2007 [facility] Patient Information 2014 [place], [place].  _______________________________________________________________________

## 2023-08-31 NOTE — Progress Notes (Signed)
  Date of COVID positive in last 90 days:  PCP - Jenkins Carrel, MD Cardiologist -   Chest x-ray -  EKG -  Stress Test -  ECHO -  Cardiac Cath -  Pacemaker/ICD device last checked: Spinal Cord Stimulator:  Bowel Prep -   Sleep Study -  CPAP -   Fasting Blood Sugar -  Checks Blood Sugar _____ times a day  Last dose of GLP1 agonist-  N/A GLP1 instructions:  Do not take after     Last dose of SGLT-2 inhibitors-  N/A SGLT-2 instructions:  Do not take after     Blood Thinner Instructions:  Last dose:   Time: Aspirin Instructions: Last Dose:  Activity level:  Can go up a flight of stairs and perform activities of daily living without stopping and without symptoms of chest pain or shortness of breath.  Able to exercise without symptoms  Unable to go up a flight of stairs without symptoms of     Anesthesia review:  Murmur  Patient denies shortness of breath, fever, cough and chest pain at PAT appointment  Patient verbalized understanding of instructions that were given to them at the PAT appointment. Patient was also instructed that they will need to review over the PAT instructions again at home before surgery.

## 2023-09-01 ENCOUNTER — Encounter: Payer: Self-pay | Admitting: Internal Medicine

## 2023-09-01 ENCOUNTER — Other Ambulatory Visit: Payer: Self-pay

## 2023-09-01 ENCOUNTER — Encounter (HOSPITAL_COMMUNITY)
Admission: RE | Admit: 2023-09-01 | Discharge: 2023-09-01 | Disposition: A | Discharge status: Home / Self Care | Source: Ambulatory Visit | Attending: Orthopaedic Surgery | Admitting: Orthopaedic Surgery

## 2023-09-01 ENCOUNTER — Encounter: Payer: Self-pay | Admitting: Family Medicine

## 2023-09-01 ENCOUNTER — Telehealth: Payer: Self-pay | Admitting: Orthopaedic Surgery

## 2023-09-01 ENCOUNTER — Encounter: Payer: Self-pay | Admitting: Nurse Practitioner

## 2023-09-01 ENCOUNTER — Encounter (HOSPITAL_COMMUNITY): Payer: Self-pay

## 2023-09-01 VITALS — BP 124/69 | HR 63 | Temp 98.4°F | Resp 12 | Ht 62.5 in | Wt 143.2 lb

## 2023-09-01 DIAGNOSIS — Z01818 Encounter for other preprocedural examination: Secondary | ICD-10-CM | POA: Insufficient documentation

## 2023-09-01 DIAGNOSIS — Z01812 Encounter for preprocedural laboratory examination: Secondary | ICD-10-CM | POA: Diagnosis not present

## 2023-09-01 DIAGNOSIS — Z01810 Encounter for preprocedural cardiovascular examination: Secondary | ICD-10-CM | POA: Diagnosis not present

## 2023-09-01 DIAGNOSIS — I2510 Atherosclerotic heart disease of native coronary artery without angina pectoris: Secondary | ICD-10-CM | POA: Diagnosis not present

## 2023-09-01 HISTORY — DX: Granuloma annulare: L92.0

## 2023-09-01 HISTORY — DX: Nonrheumatic mitral (valve) prolapse: I34.1

## 2023-09-01 LAB — SURGICAL PCR SCREEN
MRSA, PCR: NEGATIVE
Staphylococcus aureus: NEGATIVE

## 2023-09-01 LAB — BASIC METABOLIC PANEL WITH GFR
Anion gap: 11 (ref 5–15)
BUN: 7 mg/dL — ABNORMAL LOW (ref 8–23)
CO2: 26 mmol/L (ref 22–32)
Calcium: 9.4 mg/dL (ref 8.9–10.3)
Chloride: 100 mmol/L (ref 98–111)
Creatinine, Ser: 0.59 mg/dL (ref 0.44–1.00)
GFR, Estimated: >60 mL/min (ref >60)
Glucose, Bld: 85 mg/dL (ref 70–99)
Potassium: 4 mmol/L (ref 3.5–5.1)
Sodium: 137 mmol/L (ref 135–145)

## 2023-09-01 LAB — CBC
HCT: 41.5 % (ref 36.0–46.0)
Hemoglobin: 13.7 g/dL (ref 12.0–15.0)
MCH: 32.2 pg (ref 26.0–34.0)
MCHC: 33 g/dL (ref 30.0–36.0)
MCV: 97.4 fL (ref 80.0–100.0)
Platelets: 389 K/uL (ref 150–400)
RBC: 4.26 MIL/uL (ref 3.87–5.11)
RDW: 11.7 % (ref 11.5–15.5)
WBC: 6.2 K/uL (ref 4.0–10.5)
nRBC: 0 % (ref 0.0–0.2)

## 2023-09-01 NOTE — Telephone Encounter (Signed)
 Patient called and said after her pre-admission they needed her to have a talk with you about somethings. Can you please give her a call. CB#[phone]

## 2023-09-01 NOTE — Telephone Encounter (Signed)
 Last AEX 06/15/23 BMD 05/21/24 -Normal; Scheduled 03/06/24

## 2023-09-06 ENCOUNTER — Other Ambulatory Visit

## 2023-09-06 ENCOUNTER — Other Ambulatory Visit: Payer: Self-pay | Admitting: Nurse Practitioner

## 2023-09-06 ENCOUNTER — Other Ambulatory Visit: Payer: Self-pay

## 2023-09-06 DIAGNOSIS — E0500 Thyrotoxicosis with diffuse goiter without thyrotoxic crisis or storm: Secondary | ICD-10-CM | POA: Diagnosis not present

## 2023-09-06 DIAGNOSIS — B009 Herpesviral infection, unspecified: Secondary | ICD-10-CM

## 2023-09-06 LAB — T4, FREE: Free T4: 1.1 ng/dL (ref 0.8–1.8)

## 2023-09-06 LAB — TSH: TSH: 4.56 m[IU]/L — ABNORMAL HIGH (ref 0.40–4.50)

## 2023-09-06 LAB — T3, FREE: T3, Free: 3.1 pg/mL (ref 2.3–4.2)

## 2023-09-06 NOTE — Telephone Encounter (Signed)
.  Med refill request: Zovirax   Last AEX:06/15/23 Next AEX: Not scheduled  Last MMG (if hormonal med) 05/24/23 Refill authorized: Please Advise?

## 2023-09-07 ENCOUNTER — Ambulatory Visit: Payer: Self-pay | Admitting: Internal Medicine

## 2023-09-07 DIAGNOSIS — E0500 Thyrotoxicosis with diffuse goiter without thyrotoxic crisis or storm: Secondary | ICD-10-CM

## 2023-09-07 MED ORDER — METHIMAZOLE 5 MG PO TABS: 7.5000 mg | ORAL_TABLET | Freq: Every day | ORAL | Status: DC

## 2023-09-09 NOTE — H&P (Signed)
 TOTAL HIP ADMISSION H&P  Patient is admitted for right total hip arthroplasty.  Subjective:  Chief Complaint: right hip pain  HPI: Sarah Jimenez, 63 y.o. female, has a history of pain and functional disability in the right hip(s) due to arthritis and patient has failed non-surgical conservative treatments for greater than 12 weeks to include NSAID's and/or analgesics, corticosteriod injections, flexibility and strengthening excercises, and activity modification.  Onset of symptoms was gradual starting several years ago with gradually worsening course since that time.The patient noted no past surgery on the right hip(s).  Patient currently rates pain in the right hip at 10 out of 10 with activity. Patient has night pain, worsening of pain with activity and weight bearing, pain that interfers with activities of daily living, and pain with passive range of motion. Patient has evidence of subchondral cysts, subchondral sclerosis, joint space narrowing, and subchondral edema by imaging studies. This condition presents safety issues increasing the risk of falls.  There is no current active infection.  Patient Active Problem List   Diagnosis Date Noted   Unilateral primary osteoarthritis, right hip 12/16/2022   Flu-like symptoms 12/26/2021   Graves disease 03/29/2019   HSV infection 07/03/2011   Past Medical History:  Diagnosis Date   Arthritis    Cancer (HCC)    Pre cancer skin   Granuloma annulare    Graves disease 09/2018   Heart murmur    MVP (mitral valve prolapse)    Osteoarthritis    right hip    Past Surgical History:  Procedure Laterality Date   COLONOSCOPY  05/08/2011   Dr.Brodie   DILATATION & CURETTAGE/HYSTEROSCOPY WITH MYOSURE N/A 01/01/2015   Procedure: DILATATION & CURETTAGE/HYSTEROSCOPY WITH MYOSURE;  Surgeon: Curlee VEAR Guan, MD;  Location: [facility];  Service: Gynecology;  Laterality: N/A;   pyloric stenosis  4683   45 month old   SMALL INTESTINE SURGERY  Pyloric  stenosis age 41 months    No current facility-administered medications for this encounter.   Current Outpatient Medications  Medication Sig Dispense Refill Last Dose/Taking   ALPRAZolam  (XANAX ) 0.25 MG tablet TAKE 1 TABLET (0.25 MG TOTAL) BY MOUTH AT BEDTIME AS NEEDED. FOR SLEEP 30 tablet 0 Taking As Needed   cholecalciferol  (VITAMIN D3) 25 MCG (1000 UNIT) tablet Take 1,000 Units by mouth daily.   Taking   Multiple Vitamins-Minerals (WOMENS MULTI GUMMIES PO) Take 2 tablets by mouth daily.   Taking   acyclovir  (ZOVIRAX ) 200 MG capsule TAKE 5 TIMES DAILY FOR 3 TO 5 DAYS AS NEEDED 90 capsule 0    clobetasol cream (TEMOVATE) 0.05 % Apply 1 Application topically 2 (two) times daily.      methimazole  (TAPAZOLE ) 5 MG tablet Take 1.5 tablets (7.5 mg total) by mouth daily.      No Known Allergies  Social History   Tobacco Use   Smoking status: Never    Passive exposure: Never   Smokeless tobacco: Never  Substance Use Topics   Alcohol use: Not Currently    Alcohol/week: 1.0 standard drink of alcohol    Comment: one/wk    Family History  Problem Relation Age of Onset   Dementia Mother    Hypertension Father    Cancer Father 61       pancreatic   Cancer Sister    Anxiety disorder Sister    Cancer Sister    Depression Sister    Asthma Sister    Depression Brother    Mental illness Brother  Hypertension Brother    Anxiety disorder Brother    Depression Brother    Asthma Brother    Hypertension Daughter    ADD / ADHD Daughter    Depression Daughter    Anxiety disorder Daughter    Learning disabilities Daughter    Kidney disease Son    ADD / ADHD Son    Anxiety disorder Son    Learning disabilities Son    Anxiety disorder Son    Depression Son    Learning disabilities Son    Obesity Maternal Aunt    Colon cancer Paternal Uncle 50   Early death Maternal Grandmother    Heart disease Maternal Grandmother    Hearing loss Maternal Grandfather    Early death Maternal Grandfather     Heart disease Maternal Grandfather    Stomach cancer Neg Hx    Esophageal cancer Neg Hx    Colon polyps Neg Hx      Review of Systems  Objective:  Physical Exam Vitals reviewed.  Constitutional:      Appearance: Normal appearance. She is normal weight.  HENT:     Head: Normocephalic and atraumatic.  Eyes:     Extraocular Movements: Extraocular movements intact.     Pupils: Pupils are equal, round, and reactive to light.  Cardiovascular:     Rate and Rhythm: Normal rate and regular rhythm.     Pulses: Normal pulses.  Pulmonary:     Effort: Pulmonary effort is normal.     Breath sounds: Normal breath sounds.  Abdominal:     Palpations: Abdomen is soft.  Musculoskeletal:     Cervical back: Normal range of motion and neck supple.     Right hip: Tenderness and bony tenderness present. Decreased range of motion. Decreased strength.  Neurological:     Mental Status: She is alert and oriented to person, place, and time.  Psychiatric:        Behavior: Behavior normal.     Vital signs in last 24 hours:    Labs:   Estimated body mass index is 25.77 kg/m as calculated from the following:   Height as of 09/01/23: 5' 2.5 (1.588 m).   Weight as of 09/01/23: 65 kg.   Imaging Review Plain radiographs demonstrate severe degenerative joint disease of the right hip(s). The bone quality appears to be excellent for age and reported activity level.      Assessment/Plan:  End stage arthritis, right hip(s)  The patient history, physical examination, clinical judgement of the provider and imaging studies are consistent with end stage degenerative joint disease of the right hip(s) and total hip arthroplasty is deemed medically necessary. The treatment options including medical management, injection therapy, arthroscopy and arthroplasty were discussed at length. The risks and benefits of total hip arthroplasty were presented and reviewed. The risks due to aseptic loosening,  infection, stiffness, dislocation/subluxation,  thromboembolic complications and other imponderables were discussed.  The patient acknowledged the explanation, agreed to proceed with the plan and consent was signed. Patient is being admitted for inpatient treatment for surgery, pain control, PT, OT, prophylactic antibiotics, VTE prophylaxis, progressive ambulation and ADL's and discharge planning.The patient is planning to be discharged home with home health services

## 2023-09-10 ENCOUNTER — Other Ambulatory Visit: Payer: Self-pay

## 2023-09-10 ENCOUNTER — Encounter (HOSPITAL_COMMUNITY): Payer: Self-pay | Admitting: Orthopaedic Surgery

## 2023-09-10 ENCOUNTER — Ambulatory Visit (HOSPITAL_COMMUNITY)

## 2023-09-10 ENCOUNTER — Ambulatory Visit (HOSPITAL_COMMUNITY): Payer: Self-pay | Admitting: Physician Assistant

## 2023-09-10 ENCOUNTER — Observation Stay (HOSPITAL_COMMUNITY)

## 2023-09-10 ENCOUNTER — Observation Stay (HOSPITAL_COMMUNITY)
Admission: RE | Admit: 2023-09-10 | Discharge: 2023-09-13 | Disposition: A | Discharge status: Home / Self Care | Attending: Orthopaedic Surgery | Admitting: Orthopaedic Surgery

## 2023-09-10 ENCOUNTER — Ambulatory Visit (HOSPITAL_COMMUNITY): Admitting: Certified Registered"

## 2023-09-10 ENCOUNTER — Encounter (HOSPITAL_COMMUNITY)
Admission: RE | Disposition: A | Discharge status: Home / Self Care | Payer: Self-pay | Source: Home / Self Care | Attending: Orthopaedic Surgery

## 2023-09-10 DIAGNOSIS — Z96641 Presence of right artificial hip joint: Secondary | ICD-10-CM | POA: Insufficient documentation

## 2023-09-10 DIAGNOSIS — M1611 Unilateral primary osteoarthritis, right hip: Principal | ICD-10-CM | POA: Diagnosis present

## 2023-09-10 DIAGNOSIS — Z7982 Long term (current) use of aspirin: Secondary | ICD-10-CM | POA: Diagnosis not present

## 2023-09-10 DIAGNOSIS — Z471 Aftercare following joint replacement surgery: Secondary | ICD-10-CM | POA: Diagnosis not present

## 2023-09-10 HISTORY — PX: TOTAL HIP ARTHROPLASTY: SHX124

## 2023-09-10 LAB — TYPE AND SCREEN
ABO/RH(D): O POS
Antibody Screen: NEGATIVE

## 2023-09-10 LAB — ABO/RH: ABO/RH(D): O POS

## 2023-09-10 SURGERY — ARTHROPLASTY, HIP, TOTAL, ANTERIOR APPROACH
Anesthesia: Spinal | Site: Hip | Laterality: Right

## 2023-09-10 MED ORDER — METHOCARBAMOL 500 MG PO TABS
500.0000 mg | ORAL_TABLET | Freq: Four times a day (QID) | ORAL | Status: DC | PRN
  Administered 2023-09-10 – 2023-09-11 (×3): 500 mg via ORAL
  Filled 2023-09-10 (×2): qty 1

## 2023-09-10 MED ORDER — ALPRAZOLAM 0.25 MG PO TABS: 0.2500 mg | ORAL_TABLET | Freq: Every evening | ORAL | Status: DC | PRN

## 2023-09-10 MED ORDER — STERILE WATER FOR IRRIGATION IR SOLN
Status: DC | PRN
  Administered 2023-09-10: 1000 mL

## 2023-09-10 MED ORDER — FENTANYL CITRATE PF 50 MCG/ML IJ SOSY
25.0000 ug | PREFILLED_SYRINGE | INTRAMUSCULAR | Status: DC | PRN
  Administered 2023-09-10: 50 ug via INTRAVENOUS

## 2023-09-10 MED ORDER — POVIDONE-IODINE 10 % EX SWAB
2.0000 | Freq: Once | CUTANEOUS | Status: AC
  Administered 2023-09-10: 2 via TOPICAL

## 2023-09-10 MED ORDER — SODIUM CHLORIDE 0.9 % IR SOLN
Status: DC | PRN
  Administered 2023-09-10: 1000 mL

## 2023-09-10 MED ORDER — FENTANYL CITRATE (PF) 100 MCG/2ML IJ SOLN
INTRAMUSCULAR | Status: DC | PRN
  Administered 2023-09-10 (×2): 50 ug via INTRAVENOUS

## 2023-09-10 MED ORDER — OXYCODONE HCL 5 MG PO TABS
5.0000 mg | ORAL_TABLET | Freq: Once | ORAL | Status: AC | PRN
  Administered 2023-09-10: 5 mg via ORAL

## 2023-09-10 MED ORDER — DIPHENHYDRAMINE HCL 12.5 MG/5ML PO ELIX: 12.5000 mg | ORAL_SOLUTION | ORAL | Status: DC | PRN

## 2023-09-10 MED ORDER — ONDANSETRON HCL 4 MG/2ML IJ SOLN: 4.0000 mg | Freq: Four times a day (QID) | INTRAMUSCULAR | Status: DC | PRN

## 2023-09-10 MED ORDER — FENTANYL CITRATE PF 50 MCG/ML IJ SOSY
PREFILLED_SYRINGE | INTRAMUSCULAR | Status: AC
  Filled 2023-09-10: qty 1

## 2023-09-10 MED ORDER — SODIUM CHLORIDE 0.9 % IV SOLN: INTRAVENOUS | Status: DC

## 2023-09-10 MED ORDER — HYDROMORPHONE HCL 1 MG/ML IJ SOLN
0.5000 mg | INTRAMUSCULAR | Status: DC | PRN
  Administered 2023-09-10: 1 mg via INTRAVENOUS
  Filled 2023-09-10: qty 1

## 2023-09-10 MED ORDER — MIDAZOLAM HCL 2 MG/2ML IJ SOLN
INTRAMUSCULAR | Status: AC
  Filled 2023-09-10: qty 2

## 2023-09-10 MED ORDER — ONDANSETRON HCL 4 MG PO TABS
4.0000 mg | ORAL_TABLET | Freq: Four times a day (QID) | ORAL | Status: DC | PRN
  Administered 2023-09-11: 4 mg via ORAL
  Filled 2023-09-10: qty 1

## 2023-09-10 MED ORDER — ALUM & MAG HYDROXIDE-SIMETH 200-200-20 MG/5ML PO SUSP
30.0000 mL | ORAL | Status: DC | PRN
Start: 2023-09-10 — End: 2023-09-13

## 2023-09-10 MED ORDER — DEXAMETHASONE SODIUM PHOSPHATE 10 MG/ML IJ SOLN
INTRAMUSCULAR | Status: AC
  Filled 2023-09-10: qty 1

## 2023-09-10 MED ORDER — 0.9 % SODIUM CHLORIDE (POUR BTL) OPTIME
TOPICAL | Status: DC | PRN
  Administered 2023-09-10: 1000 mL

## 2023-09-10 MED ORDER — EPHEDRINE SULFATE-NACL 50-0.9 MG/10ML-% IV SOSY
PREFILLED_SYRINGE | INTRAVENOUS | Status: DC | PRN
  Administered 2023-09-10 (×3): 5 mg via INTRAVENOUS

## 2023-09-10 MED ORDER — PHENOL 1.4 % MT LIQD
1.0000 | OROMUCOSAL | Status: DC | PRN
Start: 2023-09-10 — End: 2023-09-13

## 2023-09-10 MED ORDER — METHOCARBAMOL 500 MG PO TABS
ORAL_TABLET | ORAL | Status: AC
  Filled 2023-09-10: qty 1

## 2023-09-10 MED ORDER — ONDANSETRON HCL 4 MG/2ML IJ SOLN
INTRAMUSCULAR | Status: AC
Start: 2023-09-10 — End: 2023-09-10
  Filled 2023-09-10: qty 2

## 2023-09-10 MED ORDER — ACETAMINOPHEN 10 MG/ML IV SOLN
1000.0000 mg | Freq: Once | INTRAVENOUS | Status: DC | PRN
  Administered 2023-09-10: 1000 mg via INTRAVENOUS

## 2023-09-10 MED ORDER — METOCLOPRAMIDE HCL 5 MG/ML IJ SOLN: 5.0000 mg | Freq: Three times a day (TID) | INTRAMUSCULAR | Status: DC | PRN

## 2023-09-10 MED ORDER — ACETAMINOPHEN 10 MG/ML IV SOLN
INTRAVENOUS | Status: AC
Start: 2023-09-10 — End: 2023-09-10
  Filled 2023-09-10: qty 100

## 2023-09-10 MED ORDER — CEFAZOLIN SODIUM-DEXTROSE 2-4 GM/100ML-% IV SOLN
2.0000 g | Freq: Four times a day (QID) | INTRAVENOUS | Status: AC
  Administered 2023-09-10 (×2): 2 g via INTRAVENOUS
  Filled 2023-09-10 (×2): qty 100

## 2023-09-10 MED ORDER — VITAMIN D 25 MCG (1000 UNIT) PO TABS
1000.0000 [IU] | ORAL_TABLET | Freq: Every day | ORAL | Status: DC
  Administered 2023-09-10 – 2023-09-13 (×4): 1000 [IU] via ORAL
  Filled 2023-09-10 (×4): qty 1

## 2023-09-10 MED ORDER — CEFAZOLIN SODIUM-DEXTROSE 2-4 GM/100ML-% IV SOLN
2.0000 g | INTRAVENOUS | Status: AC
  Administered 2023-09-10: 2 g via INTRAVENOUS
  Filled 2023-09-10: qty 100

## 2023-09-10 MED ORDER — PROPOFOL 1000 MG/100ML IV EMUL
INTRAVENOUS | Status: AC
  Filled 2023-09-10: qty 100

## 2023-09-10 MED ORDER — OXYCODONE HCL 5 MG PO TABS
ORAL_TABLET | ORAL | Status: AC
  Filled 2023-09-10: qty 1

## 2023-09-10 MED ORDER — MIDAZOLAM HCL 5 MG/5ML IJ SOLN
INTRAMUSCULAR | Status: DC | PRN
  Administered 2023-09-10: 2 mg via INTRAVENOUS

## 2023-09-10 MED ORDER — BUPIVACAINE IN DEXTROSE 0.75-8.25 % IT SOLN
INTRATHECAL | Status: DC | PRN
  Administered 2023-09-10: 1.6 mL via INTRATHECAL

## 2023-09-10 MED ORDER — LACTATED RINGERS IV SOLN: INTRAVENOUS | Status: DC

## 2023-09-10 MED ORDER — ACETAMINOPHEN 325 MG PO TABS
325.0000 mg | ORAL_TABLET | Freq: Four times a day (QID) | ORAL | Status: DC | PRN
  Administered 2023-09-11 – 2023-09-13 (×4): 650 mg via ORAL
  Filled 2023-09-10 (×5): qty 2

## 2023-09-10 MED ORDER — PHENYLEPHRINE HCL-NACL 20-0.9 MG/250ML-% IV SOLN
INTRAVENOUS | Status: AC
Start: 2023-09-10 — End: 2023-09-10
  Filled 2023-09-10: qty 250

## 2023-09-10 MED ORDER — OXYCODONE HCL 5 MG/5ML PO SOLN: 5.0000 mg | Freq: Once | ORAL | Status: AC | PRN

## 2023-09-10 MED ORDER — ONDANSETRON HCL 4 MG/2ML IJ SOLN
INTRAMUSCULAR | Status: DC | PRN
  Administered 2023-09-10: 4 mg via INTRAVENOUS

## 2023-09-10 MED ORDER — ORAL CARE MOUTH RINSE: 15.0000 mL | Freq: Once | OROMUCOSAL | Status: AC

## 2023-09-10 MED ORDER — METHIMAZOLE 5 MG PO TABS
7.5000 mg | ORAL_TABLET | Freq: Every day | ORAL | Status: DC
  Administered 2023-09-11 – 2023-09-13 (×3): 7.5 mg via ORAL
  Filled 2023-09-10 (×3): qty 1

## 2023-09-10 MED ORDER — METHOCARBAMOL 1000 MG/10ML IJ SOLN: 500.0000 mg | Freq: Four times a day (QID) | INTRAMUSCULAR | Status: DC | PRN

## 2023-09-10 MED ORDER — MENTHOL 3 MG MT LOZG: 1.0000 | LOZENGE | OROMUCOSAL | Status: DC | PRN

## 2023-09-10 MED ORDER — EPHEDRINE 5 MG/ML INJ
INTRAVENOUS | Status: AC
  Filled 2023-09-10: qty 5

## 2023-09-10 MED ORDER — DEXAMETHASONE SODIUM PHOSPHATE 10 MG/ML IJ SOLN
INTRAMUSCULAR | Status: DC | PRN
  Administered 2023-09-10: 10 mg via INTRAVENOUS

## 2023-09-10 MED ORDER — ONDANSETRON HCL 4 MG/2ML IJ SOLN: 4.0000 mg | Freq: Once | INTRAMUSCULAR | Status: DC | PRN

## 2023-09-10 MED ORDER — METOCLOPRAMIDE HCL 5 MG PO TABS: 5.0000 mg | ORAL_TABLET | Freq: Three times a day (TID) | ORAL | Status: DC | PRN

## 2023-09-10 MED ORDER — TRANEXAMIC ACID-NACL 1000-0.7 MG/100ML-% IV SOLN
1000.0000 mg | INTRAVENOUS | Status: AC
  Administered 2023-09-10: 1000 mg via INTRAVENOUS
  Filled 2023-09-10: qty 100

## 2023-09-10 MED ORDER — OXYCODONE HCL 5 MG PO TABS
10.0000 mg | ORAL_TABLET | ORAL | Status: DC | PRN
  Administered 2023-09-10 – 2023-09-11 (×2): 10 mg via ORAL
  Filled 2023-09-10 (×2): qty 2

## 2023-09-10 MED ORDER — PROPOFOL 10 MG/ML IV BOLUS
INTRAVENOUS | Status: DC | PRN
  Administered 2023-09-10: 20 mg via INTRAVENOUS

## 2023-09-10 MED ORDER — CHLORHEXIDINE GLUCONATE 0.12 % MT SOLN
15.0000 mL | Freq: Once | OROMUCOSAL | Status: AC
  Administered 2023-09-10: 15 mL via OROMUCOSAL

## 2023-09-10 MED ORDER — PROPOFOL 500 MG/50ML IV EMUL
INTRAVENOUS | Status: DC | PRN
Start: 2023-09-10 — End: 2023-09-10
  Administered 2023-09-10: 100 ug/kg/min via INTRAVENOUS

## 2023-09-10 MED ORDER — OXYCODONE HCL 5 MG PO TABS
5.0000 mg | ORAL_TABLET | ORAL | Status: DC | PRN
  Administered 2023-09-10 (×2): 5 mg via ORAL
  Administered 2023-09-11 (×3): 10 mg via ORAL
  Administered 2023-09-12 (×2): 5 mg via ORAL
  Filled 2023-09-10 (×2): qty 2
  Filled 2023-09-10 (×4): qty 1
  Filled 2023-09-10: qty 2
  Filled 2023-09-10: qty 1

## 2023-09-10 MED ORDER — FENTANYL CITRATE (PF) 100 MCG/2ML IJ SOLN
INTRAMUSCULAR | Status: AC
  Filled 2023-09-10: qty 2

## 2023-09-10 MED ORDER — ASPIRIN 81 MG PO CHEW
81.0000 mg | CHEWABLE_TABLET | Freq: Two times a day (BID) | ORAL | Status: DC
  Administered 2023-09-10 – 2023-09-13 (×6): 81 mg via ORAL
  Filled 2023-09-10 (×6): qty 1

## 2023-09-10 MED ORDER — DOCUSATE SODIUM 100 MG PO CAPS
100.0000 mg | ORAL_CAPSULE | Freq: Two times a day (BID) | ORAL | Status: DC
  Administered 2023-09-10 – 2023-09-13 (×7): 100 mg via ORAL
  Filled 2023-09-10 (×7): qty 1

## 2023-09-10 MED ORDER — PANTOPRAZOLE SODIUM 40 MG PO TBEC
40.0000 mg | DELAYED_RELEASE_TABLET | Freq: Every day | ORAL | Status: DC
  Administered 2023-09-10 – 2023-09-13 (×4): 40 mg via ORAL
  Filled 2023-09-10 (×4): qty 1

## 2023-09-10 SURGICAL SUPPLY — 37 items
BAG COUNTER SPONGE SURGICOUNT (BAG) ×1 IMPLANT
BAG ZIPLOCK 12X15 (MISCELLANEOUS) IMPLANT
BENZOIN TINCTURE PRP APPL 2/3 (GAUZE/BANDAGES/DRESSINGS) IMPLANT
BLADE SAW SGTL 18X1.27X75 (BLADE) ×1 IMPLANT
COVER PERINEAL POST (MISCELLANEOUS) ×1 IMPLANT
COVER SURGICAL LIGHT HANDLE (MISCELLANEOUS) ×1 IMPLANT
CUP SECTOR GRIPTON 50MM (Cup) IMPLANT
DRAPE FOOT SWITCH (DRAPES) ×1 IMPLANT
DRAPE STERI IOBAN 125X83 (DRAPES) ×1 IMPLANT
DRAPE U-SHAPE 47X51 STRL (DRAPES) ×2 IMPLANT
DRSG AQUACEL AG ADV 3.5X10 (GAUZE/BANDAGES/DRESSINGS) ×1 IMPLANT
DURAPREP 26ML APPLICATOR (WOUND CARE) ×1 IMPLANT
ELECT PENCIL ROCKER SW 15FT (MISCELLANEOUS) ×1 IMPLANT
ELECT REM PT RETURN 15FT ADLT (MISCELLANEOUS) ×1 IMPLANT
GAUZE XEROFORM 1X8 LF (GAUZE/BANDAGES/DRESSINGS) IMPLANT
GLOVE BIO SURGEON STRL SZ7.5 (GLOVE) ×1 IMPLANT
GLOVE BIOGEL PI IND STRL 8 (GLOVE) ×2 IMPLANT
GLOVE ECLIPSE 8.0 STRL XLNG CF (GLOVE) ×1 IMPLANT
GOWN STRL REUS W/ TWL XL LVL3 (GOWN DISPOSABLE) ×2 IMPLANT
HEAD FEMORAL 32 CERAMIC (Hips) IMPLANT
HOLDER FOLEY CATH W/STRAP (MISCELLANEOUS) ×1 IMPLANT
KIT TURNOVER KIT A (KITS) ×1 IMPLANT
LINER ACET PNNCL PLUS4 NEUTRAL (Hips) IMPLANT
PACK ANTERIOR HIP CUSTOM (KITS) ×1 IMPLANT
SET HNDPC FAN SPRY TIP SCT (DISPOSABLE) ×1 IMPLANT
STAPLER SKIN PROX 35W (STAPLE) IMPLANT
STAPLER SKIN PROX WIDE 3.9 (STAPLE) IMPLANT
STEM FEM ACTIS HIGH SZ2 (Stem) IMPLANT
STRIP CLOSURE SKIN 1/2X4 (GAUZE/BANDAGES/DRESSINGS) IMPLANT
SUT ETHIBOND NAB CT1 #1 30IN (SUTURE) ×1 IMPLANT
SUT ETHILON 2 0 PS N (SUTURE) IMPLANT
SUT MNCRL AB 4-0 PS2 18 (SUTURE) IMPLANT
SUT VIC AB 0 CT1 36 (SUTURE) ×1 IMPLANT
SUT VIC AB 1 CT1 36 (SUTURE) ×1 IMPLANT
SUT VIC AB 2-0 CT1 TAPERPNT 27 (SUTURE) ×2 IMPLANT
TRAY FOLEY MTR SLVR 16FR STAT (SET/KITS/TRAYS/PACK) IMPLANT
YANKAUER SUCT BULB TIP NO VENT (SUCTIONS) ×1 IMPLANT

## 2023-09-10 NOTE — Transfer of Care (Signed)
 Immediate Anesthesia Transfer of Care Note  Patient: Sarah Jimenez  Procedure(s) Performed: ARTHROPLASTY, HIP, TOTAL, ANTERIOR APPROACH (Right: Hip)  Patient Location: PACU  Anesthesia Type:Spinal  Level of Consciousness: awake, alert , and oriented  Airway & Oxygen Therapy: Patient Spontanous Breathing and Patient connected to face mask oxygen  Post-op Assessment: Report given to RN and Post -op Vital signs reviewed and stable  Post vital signs: Reviewed and stable  Last Vitals:  Vitals Value Taken Time  BP 96/62 09/10/23 08:41  Temp    Pulse 61 09/10/23 08:44  Resp 12 09/10/23 08:44  SpO2 97 % 09/10/23 08:44  Vitals shown include unfiled device data.  Last Pain:  Vitals:   09/10/23 0549  TempSrc:   PainSc: 0-No pain         Complications: No notable events documented.

## 2023-09-10 NOTE — Anesthesia Procedure Notes (Signed)
 Spinal  Patient location during procedure: OR End time: 09/10/2023 7:19 AM Reason for block: surgical anesthesia Staffing Performed: resident/CRNA  Resident/CRNA: Dwana Gong D, CRNA Performed by: Ayme Short, Clinical cytogeneticist D, CRNA Authorized by: Davide Risdon, Clinical cytogeneticist D, CRNA   Preanesthetic Checklist Completed: patient identified, IV checked, site marked, risks and benefits discussed, surgical consent, monitors and equipment checked, pre-op evaluation and timeout performed Spinal Block Patient position: sitting Prep: DuraPrep Patient monitoring: heart rate, continuous pulse ox and blood pressure Approach: midline Location: L3-4 Injection technique: single-shot Needle Needle type: Pencan  Needle gauge: 24 G Needle length: 9 cm Assessment Sensory level: T6 Events: CSF return

## 2023-09-10 NOTE — Anesthesia Preprocedure Evaluation (Addendum)
 Anesthesia Evaluation  Patient identified by MRN, date of birth, ID band Patient awake    Reviewed: Allergy & Precautions, NPO status , Patient's Chart, lab work & pertinent test results, reviewed documented beta blocker date and time   History of Anesthesia Complications Negative for: history of anesthetic complications  Airway Mallampati: II  TM Distance: >3 FB     Dental no notable dental hx.    Pulmonary neg COPD   breath sounds clear to auscultation       Cardiovascular (-) angina (-) CAD and (-) Past MI + Valvular Problems/Murmurs  Rhythm:Regular Rate:Normal     Neuro/Psych neg Seizures    GI/Hepatic ,neg GERD  ,,(+) neg Cirrhosis        Endo/Other    Renal/GU Renal disease     Musculoskeletal  (+) Arthritis , Osteoarthritis,    Abdominal   Peds  Hematology   Anesthesia Other Findings Graves dz on methimazole   Reproductive/Obstetrics                              Anesthesia Physical Anesthesia Plan  ASA: 2  Anesthesia Plan: Spinal   Post-op Pain Management:    Induction: Intravenous  PONV Risk Score and Plan: 2 and Ondansetron   Airway Management Planned: Natural Airway and Simple Face Mask  Additional Equipment:   Intra-op Plan:   Post-operative Plan: Extubation in OR  Informed Consent: I have reviewed the patients History and Physical, chart, labs and discussed the procedure including the risks, benefits and alternatives for the proposed anesthesia with the patient or authorized representative who has indicated his/her understanding and acceptance.     Dental advisory given  Plan Discussed with: CRNA  Anesthesia Plan Comments:          Anesthesia Quick Evaluation

## 2023-09-10 NOTE — Op Note (Signed)
 Operative Note  Date of operation: 09/10/2023 Preoperative diagnosis: Right hip primary osteoarthritis Postoperative diagnosis: Same  Procedure: Right direct anterior total hip arthroplasty  Implants: Implant Name Type Inv. Item Serial No. Manufacturer Lot No. LRB No. Used Action  CUP SECTOR GRIPTON - [id number] Cup CUP SECTOR GRIPTON  [employer]PU[employer] [id number] Right 1 Implanted  LINER ACET PNNCL PLUS4 NEUTRAL - [id number] Hips LINER ACET PNNCL PLUS4 NEUTRAL  DEPUY ORTHOPAEDICS [id number] Right 1 Implanted  STEM FEM ACTIS HIGH SZ2 - [id number] Stem STEM FEM ACTIS HIGH SZ2  DEPUY ORTH[employer] [id number] Right 1 Implanted  HEAD FEMORAL 32 CERAMIC - [id number] Hips HEAD FEMORAL 32 CERAMIC  DEPUY ORTH[employer] [id number] Right 1 Implanted   Surgeon: Lonni GRADE. Vernetta, MD Assistant: Tory Gaskins, PA-C  Anesthesia: Spinal Antibiotics: IV Ancef  EBL: 100 cc Complications: None  Indications: The patient is a 63 year old female with debilitating arthritis involving her right hip.  This has been seen mainly on MRI and clinical exam findings.  Her x-ray findings also show arthritic changes.  At this point she has tried and failed all forms of conservative treatment including intra-articular steroid injections.  Her right hip pain is almost daily and it is detrimentally vetting her mobility, her quality of life and her actives day living to the point she does wish to proceed with hip replacement and we agree with this as well.  We did discuss the risks of acute blood loss anemia, nerve and vessel injury, fracture, infection, dislocation, DVT, implant failure, leg length differences and wound healing issues.  She understands that our goals are hopefully decreased pain, improved mobility and improved quality of life.  Procedure description: After informed consent was obtained the appropriate right hip was marked, the patient was brought to the operating room and set up on a stretcher where spinal  anesthesia was obtained.  She was laid in supine position on the stretcher and a Foley catheter was placed.  Traction boots were next placed on both her feet and then she was placed supine on the Hana fracture table with a perineal post and placed in both legs in inline skeletal traction devices no traction applied.  Her right operative hip and pelvis were assessed radiographically.  The right hip was prepped and draped with DuraPrep and sterile drapes.  A timeout was called and she was identified as the correct patient the correct right hip.  An incision was then made just inferior and posterior to the ASIS and carried slightly obliquely down the leg.  Dissection was carried down to the tensor fascia lata muscle and tensor fascia was then divided longitudinally to proceed with a direct anterior process the hip.  Circumflex vessels were identified and cauterized.  The hip capsule was identified and opened up in L-type format finding a moderate joint effusion.  Cobra retractors were placed around the medial and lateral femoral neck and a femoral neck cut was made with an oscillating saw just proximal to the lesser trochanter.  A corkscrew guide was placed in the femoral head and the femoral head was removed in its entirety and there was significant cartilage wear of the weightbearing surface of the femoral head and acetabulum.  Remnants of the acetabular labrum and other debris were removed.  A bent Hohmann was placed over the medial acetabular rim and remnants of the acetabular labrum and other debris removed.  Reaming was then initiated from a size 43 reamer and stepwise increments going up to a size 49 reamer with all reamers  under direct visualization and the last reamer also done under direct fluoroscopy in order to obtain the depth of reaming, the inclination and the anteversion.  The real DePuy sector GRIPTION acetabular component size 50 was then placed without difficulty followed by a 32+4 polythene liner.   Attention was then turned to the femur.  With the right leg externally rotated to 120 degrees, extended and adducted, a Mueller retractor was placed medially and a Hohmann retractor behind greater trochanter.  The lateral joint capsule was released and a box cutting osteotome was used to enter the femoral canal.  Broaching was then initiated using the Actis broaching system from a size 0 going to a size 2.  With a size 2 in place we trialed a standard offset femoral neck and a 32+1 trial hip ball.  The right leg was brought over and up and with traction and internal rotation reduced in the pelvis.  Based on clinical and radiographic assessment we felt like we needed more offset.  We dislocated the hip remove the trial components.  We then placed the real Actis femoral component with high offset size 2 and with the real 32+1 ceramic head ball.  Again this was reduced in the pelvis and we are pleased with leg length, offset, range of motion and stability assessed mechanically and radiographically.  The soft tissue was then irrigated with normal saline solution.  Remnants of the joint capsule were closed with interrupted #1 Ethibond suture followed by #1 Vicryl to close the tensor fascia.  0 Vicryl is used to close deep tissue and 2-0 Vicryl used to close subcutaneous tissue.  The skin was closed with staples.  An Aquacel dressing was applied.  The patient was taken off the Hana table and taken recovery room.  Tory Gaskins, PA-C did assist during the entire case and beginning to end and his assistance was crucial and medically necessary for soft tissue management and retraction, helping guide implant placement and a layered closure of the wound.

## 2023-09-10 NOTE — Interval H&P Note (Signed)
 History and Physical Interval Note: The patient understands that she is here today for a right total hip replacement to treat her significant right hip pain and arthritis.  There has been no acute or interval change in her medical status.  The risks and benefits of surgery have been discussed in detail and informed consent has been obtained.  The right operative hip has been marked.  09/10/2023 6:52 AM  Sarah Jimenez  has presented today for surgery, with the diagnosis of osteoarthritis right hip.  The various methods of treatment have been discussed with the patient and family. After consideration of risks, benefits and other options for treatment, the patient has consented to  Procedure(s): ARTHROPLASTY, HIP, TOTAL, ANTERIOR APPROACH (Right) as a surgical intervention.  The patient's history has been reviewed, patient examined, no change in status, stable for surgery.  I have reviewed the patient's chart and labs.  Questions were answered to the patient's satisfaction.     Sarah Jimenez

## 2023-09-10 NOTE — Evaluation (Signed)
 Physical Therapy Evaluation Patient Details Name: Sarah Jimenez MRN: [id number] DOB: Jan 20, 1961 Today's Date: 09/10/2023  History of Present Illness  Pt s/p R THR and with hx of Graves Dz and MVP  Clinical Impression  Pt s/p R THR and presents with decreased R LE strength/ROM and post op pain limiting functional mobility.  Pt should progress to dc home with 24/7 family assist.        If plan is discharge home, recommend the following: A little help with walking and/or transfers;A little help with bathing/dressing/bathroom;Assistance with cooking/housework;Assist for transportation;Help with stairs or ramp for entrance   Can travel by private vehicle        Equipment Recommendations None recommended by PT  Recommendations for Other Services       Functional Status Assessment Patient has had a recent decline in their functional status and demonstrates the ability to make significant improvements in function in a reasonable and predictable amount of time.     Precautions / Restrictions Precautions Precautions: Fall Restrictions Weight Bearing Restrictions Per Provider Order: Yes RLE Weight Bearing Per Provider Order: Weight bearing as tolerated      Mobility  Bed Mobility Overal bed mobility: Needs Assistance Bed Mobility: Supine to Sit     Supine to sit: Min assist     General bed mobility comments: cues for sequence and use of L LE to self assist    Transfers Overall transfer level: Needs assistance   Transfers: Sit to/from Stand Sit to Stand: Min assist, Mod assist           General transfer comment: cues for LE management and use of UEs to self assist    Ambulation/Gait Ambulation/Gait assistance: Min assist Gait Distance (Feet): 10 Feet Assistive device: Rolling walker (2 wheels) Gait Pattern/deviations: Step-to pattern, Decreased step length - right, Decreased step length - left, Shuffle, Trunk flexed Gait velocity: decr     General Gait  Details: cues for sequence, posture, position from RW; distance ltd by nausea  Stairs            Wheelchair Mobility     Tilt Bed    Modified Rankin (Stroke Patients Only)       Balance Overall balance assessment: Needs assistance Sitting-balance support: No upper extremity supported, Feet supported Sitting balance-Leahy Scale: Fair     Standing balance support: Bilateral upper extremity supported Standing balance-Leahy Scale: Poor                               Pertinent Vitals/Pain Pain Assessment Pain Assessment: 0-10 Pain Score: 5  Pain Location: R hip Pain Descriptors / Indicators: Aching, Burning, Sore Pain Intervention(s): Limited activity within patient's tolerance, Monitored during session, Premedicated before session, Ice applied    Home Living Family/patient expects to be discharged to:: Private residence Living Arrangements: Alone Available Help at Discharge: Family;Available 24 hours/day Type of Home: House Home Access: Stairs to enter Entrance Stairs-Rails: Doctor, general practice of Steps: 3   Home Layout: One level Home Equipment: Agricultural consultant (2 wheels);Cane - single point;Shower seat      Prior Function Prior Level of Function : Independent/Modified Independent                     Extremity/Trunk Assessment   Upper Extremity Assessment Upper Extremity Assessment: Overall WFL for tasks assessed    Lower Extremity Assessment Lower Extremity Assessment: RLE deficits/detail    Cervical /  Trunk Assessment Cervical / Trunk Assessment: Normal  Communication   Communication Communication: No apparent difficulties    Cognition Arousal: Alert Behavior During Therapy: WFL for tasks assessed/performed   PT - Cognitive impairments: No apparent impairments                         Following commands: Intact       Cueing Cueing Techniques: Verbal cues     General Comments      Exercises  Total Joint Exercises Ankle Circles/Pumps: AROM, Both, 15 reps, Supine   Assessment/Plan    PT Assessment Patient needs continued PT services  PT Problem List Decreased strength;Decreased range of motion;Decreased activity tolerance;Decreased balance;Decreased mobility;Decreased knowledge of use of DME;Pain       PT Treatment Interventions DME instruction;Gait training;Stair training;Functional mobility training;Therapeutic activities;Therapeutic exercise;Patient/family education    PT Goals (Current goals can be found in the Care Plan section)  Acute Rehab PT Goals Patient Stated Goal: Regain IND PT Goal Formulation: With patient Time For Goal Achievement: 09/17/23 Potential to Achieve Goals: Good    Frequency 7X/week     Co-evaluation               AM-PAC PT 6 Clicks Mobility  Outcome Measure Help needed turning from your back to your side while in a flat bed without using bedrails?: A Little Help needed moving from lying on your back to sitting on the side of a flat bed without using bedrails?: A Little Help needed moving to and from a bed to a chair (including a wheelchair)?: A Little Help needed standing up from a chair using your arms (e.g., wheelchair or bedside chair)?: A Little Help needed to walk in hospital room?: A Lot Help needed climbing 3-5 steps with a railing? : A Lot 6 Click Score: 16    End of Session Equipment Utilized During Treatment: Gait belt Activity Tolerance: Patient tolerated treatment well Patient left: in chair;with call bell/phone within reach;with chair alarm set Nurse Communication: Mobility status PT Visit Diagnosis: Difficulty in walking, not elsewhere classified (R26.2)    Time: 8464-8397 PT Time Calculation (min) (ACUTE ONLY): 27 min   Charges:   PT Evaluation $PT Eval Low Complexity: 1 Low PT Treatments $Gait Training: 8-22 mins PT General Charges $$ ACUTE PT VISIT: 1 Visit         [facility] PT Acute Rehabilitation  Services Office [phone]   Alder Murri 09/10/2023, 4:28 PM

## 2023-09-10 NOTE — Anesthesia Postprocedure Evaluation (Signed)
 Anesthesia Post Note  Patient: Sarah Jimenez  Procedure(s) Performed: ARTHROPLASTY, HIP, TOTAL, ANTERIOR APPROACH (Right: Hip)     Patient location during evaluation: PACU Anesthesia Type: Spinal Level of consciousness: awake and alert Pain management: pain level controlled Vital Signs Assessment: post-procedure vital signs reviewed and stable Respiratory status: spontaneous breathing, nonlabored ventilation, respiratory function stable and patient connected to nasal cannula oxygen Cardiovascular status: blood pressure returned to baseline and stable Postop Assessment: no apparent nausea or vomiting Anesthetic complications: no   No notable events documented.  Last Vitals:  Vitals:   09/10/23 1033 09/10/23 1224  BP: 115/65 (!) 104/56  Pulse: (!) 56 60  Resp: 16 15  Temp: 36.6 C 36.5 C  SpO2: 100% 97%    Last Pain:  Vitals:   09/10/23 1224  TempSrc: Oral  PainSc:                  Lynwood MARLA Cornea

## 2023-09-11 DIAGNOSIS — Z7982 Long term (current) use of aspirin: Secondary | ICD-10-CM | POA: Diagnosis not present

## 2023-09-11 DIAGNOSIS — Z96641 Presence of right artificial hip joint: Secondary | ICD-10-CM | POA: Diagnosis not present

## 2023-09-11 DIAGNOSIS — M1611 Unilateral primary osteoarthritis, right hip: Secondary | ICD-10-CM | POA: Diagnosis not present

## 2023-09-11 LAB — BASIC METABOLIC PANEL WITH GFR
Anion gap: 7 (ref 5–15)
BUN: 6 mg/dL — ABNORMAL LOW (ref 8–23)
CO2: 25 mmol/L (ref 22–32)
Calcium: 8.4 mg/dL — ABNORMAL LOW (ref 8.9–10.3)
Chloride: 100 mmol/L (ref 98–111)
Creatinine, Ser: 0.62 mg/dL (ref 0.44–1.00)
GFR, Estimated: >60 mL/min (ref >60)
Glucose, Bld: 121 mg/dL — ABNORMAL HIGH (ref 70–99)
Potassium: 4 mmol/L (ref 3.5–5.1)
Sodium: 132 mmol/L — ABNORMAL LOW (ref 135–145)

## 2023-09-11 LAB — CBC
HCT: 33 % — ABNORMAL LOW (ref 36.0–46.0)
Hemoglobin: 10.8 g/dL — ABNORMAL LOW (ref 12.0–15.0)
MCH: 32 pg (ref 26.0–34.0)
MCHC: 32.7 g/dL (ref 30.0–36.0)
MCV: 97.9 fL (ref 80.0–100.0)
Platelets: 324 K/uL (ref 150–400)
RBC: 3.37 MIL/uL — ABNORMAL LOW (ref 3.87–5.11)
RDW: 11.9 % (ref 11.5–15.5)
WBC: 13.7 K/uL — ABNORMAL HIGH (ref 4.0–10.5)
nRBC: 0 % (ref 0.0–0.2)

## 2023-09-11 MED ORDER — SODIUM CHLORIDE 0.9 % IV SOLN: INTRAVENOUS | Status: AC

## 2023-09-11 MED ORDER — TIZANIDINE HCL 4 MG PO TABS
4.0000 mg | ORAL_TABLET | Freq: Four times a day (QID) | ORAL | Status: DC | PRN
  Administered 2023-09-11: 4 mg via ORAL
  Filled 2023-09-11: qty 1

## 2023-09-11 MED ORDER — ASPIRIN 81 MG PO CHEW: 81.0000 mg | CHEWABLE_TABLET | Freq: Two times a day (BID) | ORAL | 0 refills | Status: DC

## 2023-09-11 MED ORDER — SODIUM CHLORIDE 0.9 % IV BOLUS
500.0000 mL | Freq: Once | INTRAVENOUS | Status: AC
  Administered 2023-09-11: 500 mL via INTRAVENOUS

## 2023-09-11 MED ORDER — TIZANIDINE HCL 4 MG PO TABS: 4.0000 mg | ORAL_TABLET | Freq: Four times a day (QID) | ORAL | 0 refills | Status: DC | PRN

## 2023-09-11 MED ORDER — OXYCODONE HCL 5 MG PO TABS: 5.0000 mg | ORAL_TABLET | Freq: Four times a day (QID) | ORAL | 0 refills | Status: DC | PRN

## 2023-09-11 NOTE — Plan of Care (Signed)

## 2023-09-11 NOTE — Discharge Instructions (Signed)
 Per [facility] clinic policy, our goal is ensure optimal postoperative pain control with a multimodal pain management strategy. For all [facility] patients, our goal is to wean post-operative narcotic medications by 6 weeks post-operatively. If this is not possible due to utilization of pain medication prior to surgery, your [facility] doctor will support your acute post-operative pain control for the first 6 weeks postoperatively, with a plan to transition you back to your primary pain team following that. Sarah Jimenez will work to ensure a Therapist, occupational.  INSTRUCTIONS AFTER JOINT REPLACEMENT   Remove items at home which could result in a fall. This includes throw rugs or furniture in walking pathways ICE to the affected joint every three hours while awake for 30 minutes at a time, for at least the first 3-5 days, and then as needed for pain and swelling.  Continue to use ice for pain and swelling. You may notice swelling that will progress down to the foot and ankle.  This is normal after surgery.  Elevate your leg when you are not up walking on it.   Continue to use the breathing machine you got in the hospital (incentive spirometer) which will help keep your temperature down.  It is common for your temperature to cycle up and down following surgery, especially at night when you are not up moving around and exerting yourself.  The breathing machine keeps your lungs expanded and your temperature down.   DIET:  As you were doing prior to hospitalization, we recommend a well-balanced diet.  DRESSING / WOUND CARE / SHOWERING  Keep the surgical dressing until follow up.  The dressing is water  proof, so you can shower without any extra covering.  IF THE DRESSING FALLS OFF or the wound gets wet inside, change the dressing with sterile gauze.  Please use good hand washing techniques before changing the dressing.  Do not use any lotions or creams on the incision until instructed by your surgeon.     ACTIVITY  Increase activity slowly as tolerated, but follow the weight bearing instructions below.   No driving for 6 weeks or until further direction given by your physician.  You cannot drive while taking narcotics.  No lifting or carrying greater than 10 lbs. until further directed by your surgeon. Avoid periods of inactivity such as sitting longer than an hour when not asleep. This helps prevent blood clots.  You may return to work once you are authorized by your doctor.     WEIGHT BEARING   Weight bearing as tolerated with assist device (walker, cane, etc) as directed, use it as long as suggested by your surgeon or therapist, typically at least 4-6 weeks.   EXERCISES  Results after joint replacement surgery are often greatly improved when you follow the exercise, range of motion and muscle strengthening exercises prescribed by your doctor. Safety measures are also important to protect the joint from further injury. Any time any of these exercises cause you to have increased pain or swelling, decrease what you are doing until you are comfortable again and then slowly increase them. If you have problems or questions, call your caregiver or physical therapist for advice.   Rehabilitation is important following a joint replacement. After just a few days of immobilization, the muscles of the leg can become weakened and shrink (atrophy).  These exercises are designed to build up the tone and strength of the thigh and leg muscles and to improve motion. Often times heat used for twenty to thirty minutes before  working out will loosen up your tissues and help with improving the range of motion but do not use heat for the first two weeks following surgery (sometimes heat can increase post-operative swelling).   These exercises can be done on a training (exercise) mat, on the floor, on a table or on a bed. Use whatever works the best and is most comfortable for you.    Use music or television  while you are exercising so that the exercises are a pleasant break in your day. This will make your life better with the exercises acting as a break in your routine that you can look forward to.   Perform all exercises about fifteen times, three times per day or as directed.  You should exercise both the operative leg and the other leg as well.  Exercises include:   Quad Sets - Tighten up the muscle on the front of the thigh (Quad) and hold for 5-10 seconds.   Straight Leg Raises - With your knee straight (if you were given a brace, keep it on), lift the leg to 60 degrees, hold for 3 seconds, and slowly lower the leg.  Perform this exercise against resistance later as your leg gets stronger.  Leg Slides: Lying on your back, slowly slide your foot toward your buttocks, bending your knee up off the floor (only go as far as is comfortable). Then slowly slide your foot back down until your leg is flat on the floor again.  Angel Wings: Lying on your back spread your legs to the side as far apart as you can without causing discomfort.  Hamstring Strength:  Lying on your back, push your heel against the floor with your leg straight by tightening up the muscles of your buttocks.  Repeat, but this time bend your knee to a comfortable angle, and push your heel against the floor.  You may put a pillow under the heel to make it more comfortable if necessary.   A rehabilitation program following joint replacement surgery can speed recovery and prevent re-injury in the future due to weakened muscles. Contact your doctor or a physical therapist for more information on knee rehabilitation.    CONSTIPATION  Constipation is defined medically as fewer than three stools per week and severe constipation as less than one stool per week.  Even if you have a regular bowel pattern at home, your normal regimen is likely to be disrupted due to multiple reasons following surgery.  Combination of anesthesia, postoperative  narcotics, change in appetite and fluid intake all can affect your bowels.   YOU MUST use at least one of the following options; they are listed in order of increasing strength to get the job done.  They are all available over the counter, and you may need to use some, POSSIBLY even all of these options:    Drink plenty of fluids (prune juice may be helpful) and high fiber foods Colace 100 mg by mouth twice a day  Senokot for constipation as directed and as needed Dulcolax (bisacodyl ), take with full glass of water   Miralax  (polyethylene glycol) once or twice a day as needed.  If you have tried all these things and are unable to have a bowel movement in the first 3-4 days after surgery call either your surgeon or your primary doctor.    If you experience loose stools or diarrhea, hold the medications until you stool forms back up.  If your symptoms do not get better within 1 week  or if they get worse, check with your doctor.  If you experience the worst abdominal pain ever or develop nausea or vomiting, please contact the office immediately for further recommendations for treatment.   ITCHING:  If you experience itching with your medications, try taking only a single pain pill, or even half a pain pill at a time.  You can also use Benadryl  over the counter for itching or also to help with sleep.   TED HOSE STOCKINGS:  Use stockings on both legs until for at least 2 weeks or as directed by physician office. They may be removed at night for sleeping.  MEDICATIONS:  See your medication summary on the "After Visit Summary" that nursing will review with you.  You may have some home medications which will be placed on hold until you complete the course of blood thinner medication.  It is important for you to complete the blood thinner medication as prescribed.  PRECAUTIONS:  If you experience chest pain or shortness of breath - call 911 immediately for transfer to the hospital emergency department.    If you develop a fever greater that 101 F, purulent drainage from wound, increased redness or drainage from wound, foul odor from the wound/dressing, or calf pain - CONTACT YOUR SURGEON.                                                   FOLLOW-UP APPOINTMENTS:  If you do not already have a post-op appointment, please call the office for an appointment to be seen by your surgeon.  Guidelines for how soon to be seen are listed in your "After Visit Summary", but are typically between 1-4 weeks after surgery.  OTHER INSTRUCTIONS:   Knee Replacement:  Do not place pillow under knee, focus on keeping the knee straight while resting. CPM instructions: 0-90 degrees, 2 hours in the morning, 2 hours in the afternoon, and 2 hours in the evening. Place foam block, curve side up under heel at all times except when in CPM or when walking.  DO NOT modify, tear, cut, or change the foam block in any way.  POST-OPERATIVE OPIOID TAPER INSTRUCTIONS: It is important to wean off of your opioid medication as soon as possible. If you do not need pain medication after your surgery it is ok to stop day one. Opioids include: Codeine, Hydrocodone(Norco, Vicodin), Oxycodone (Percocet, oxycontin ) and hydromorphone  amongst others.  Long term and even short term use of opiods can cause: Increased pain response Dependence Constipation Depression Respiratory depression And more.  Withdrawal symptoms can include Flu like symptoms Nausea, vomiting And more Techniques to manage these symptoms Hydrate well Eat regular healthy meals Stay active Use relaxation techniques(deep breathing, meditating, yoga) Do Not substitute Alcohol to help with tapering If you have been on opioids for less than two weeks and do not have pain than it is ok to stop all together.  Plan to wean off of opioids This plan should start within one week post op of your joint replacement. Maintain the same interval or time between taking each dose  and first decrease the dose.  Cut the total daily intake of opioids by one tablet each day Next start to increase the time between doses. The last dose that should be eliminated is the evening dose.   MAKE SURE YOU:  Understand these instructions.  Get help right away if you are not doing well or get worse.    Thank you for letting us  be a part of your medical care team.  It is a privilege we respect greatly.  We hope these instructions will help you stay on track for a fast and full recovery!     Dental Antibiotics:  In most cases prophylactic antibiotics for Dental procdeures after total joint surgery are not necessary.  Exceptions are as follows:  1. History of prior total joint infection  2. Severely immunocompromised (Organ Transplant, cancer chemotherapy, Rheumatoid biologic meds such as Humera)  3. Poorly controlled diabetes (A1C &gt; 8.0, blood glucose over 200)  If you have one of these conditions, contact your surgeon for an antibiotic prescription, prior to your dental procedure.

## 2023-09-11 NOTE — Progress Notes (Signed)
 Subjective: 1 Day Post-Op Procedure(s) (LRB): ARTHROPLASTY, HIP, TOTAL, ANTERIOR APPROACH (Right) Patient reports pain as moderate.  However she does report a bad evening with spasms.  Objective: Vital signs in last 24 hours: Temp:  [97.5 F (36.4 C)-98.4 F (36.9 C)] 97.9 F (36.6 C) (08/16 0626) Pulse Rate:  [49-79] 74 (08/16 0626) Resp:  [10-19] 16 (08/16 0626) BP: (92-116)/(54-67) 102/54 (08/16 0626) SpO2:  [94 %-100 %] 94 % (08/16 0626) Weight:  [65 kg] 65 kg (08/15 1033)  Intake/Output from previous day: 08/15 0701 - 08/16 0700 In: 3218.9 [P.O.:840; I.V.:2278.9; IV Piggyback:100] Out: 4300 [Urine:4200; Blood:100] Intake/Output this shift: No intake/output data recorded.  Recent Labs    09/11/23 0307  HGB 10.8*   Recent Labs    09/11/23 0307  WBC 13.7*  RBC 3.37*  HCT 33.0*  PLT 324   Recent Labs    09/11/23 0307  NA 132*  K 4.0  CL 100  CO2 25  BUN 6*  CREATININE 0.62  GLUCOSE 121*  CALCIUM 8.4*   No results for input(s): LABPT, INR in the last 72 hours.  Sensation intact distally Intact pulses distally Dorsiflexion/Plantar flexion intact Incision: dressing C/D/I   Assessment/Plan: 1 Day Post-Op Procedure(s) (LRB): ARTHROPLASTY, HIP, TOTAL, ANTERIOR APPROACH (Right) Up with therapy Discharge home with home health this afternoon if clears PT.      Lonni CINDERELLA Poli 09/11/2023, 8:01 AM

## 2023-09-11 NOTE — Progress Notes (Signed)
 Physical Therapy Treatment Patient Details Name: Sarah Jimenez MRN: [id number] DOB: 26-May-1960 Today's Date: 09/11/2023   History of Present Illness Pt s/p R THR and with hx of Graves Dz and MVP    PT Comments  Pt continues very cooperative but activity level limited by orthostatic hypotension.  BP supine 111/55; sit 123/65; stand 109/59; and after ambulating 22' 97/46.  RN present and assessing.    If plan is discharge home, recommend the following: A little help with walking and/or transfers;A little help with bathing/dressing/bathroom;Assistance with cooking/housework;Assist for transportation;Help with stairs or ramp for entrance   Can travel by private vehicle        Equipment Recommendations  None recommended by PT    Recommendations for Other Services       Precautions / Restrictions Precautions Precautions: Fall Restrictions Weight Bearing Restrictions Per Provider Order: Yes RLE Weight Bearing Per Provider Order: Weight bearing as tolerated     Mobility  Bed Mobility Overal bed mobility: Needs Assistance Bed Mobility: Supine to Sit, Sit to Supine     Supine to sit: Min assist Sit to supine: Min assist, Mod assist   General bed mobility comments: cues for sequence and use of L LE to self assist    Transfers Overall transfer level: Needs assistance Equipment used: Rolling walker (2 wheels) Transfers: Sit to/from Stand Sit to Stand: Min assist, +2 safety/equipment           General transfer comment: cues for LE management and use of UEs to self assist    Ambulation/Gait Ambulation/Gait assistance: Min assist Gait Distance (Feet): 22 Feet Assistive device: Rolling walker (2 wheels) Gait Pattern/deviations: Step-to pattern, Decreased step length - right, Decreased step length - left, Shuffle, Trunk flexed Gait velocity: decr     General Gait Details: cues for sequence, posture, position from RW; distance ltd by fatigue/pain   Stairs              Wheelchair Mobility     Tilt Bed    Modified Rankin (Stroke Patients Only)       Balance Overall balance assessment: Needs assistance Sitting-balance support: No upper extremity supported, Feet supported Sitting balance-Leahy Scale: Fair     Standing balance support: Bilateral upper extremity supported Standing balance-Leahy Scale: Poor                              Communication Communication Communication: No apparent difficulties  Cognition Arousal: Alert Behavior During Therapy: WFL for tasks assessed/performed   PT - Cognitive impairments: No apparent impairments                         Following commands: Intact      Cueing Cueing Techniques: Verbal cues  Exercises Total Joint Exercises Ankle Circles/Pumps: AROM, Both, 15 reps, Supine Quad Sets: AROM, Both, 10 reps, Supine Heel Slides: AAROM, Right, 20 reps, Supine Hip ABduction/ADduction: AAROM, Right, 15 reps, Supine    General Comments        Pertinent Vitals/Pain Pain Assessment Pain Assessment: 0-10 Pain Score: 5  Pain Location: R hip Pain Descriptors / Indicators: Aching, Burning, Sore, Tightness Pain Intervention(s): Limited activity within patient's tolerance, Monitored during session, Premedicated before session, Ice applied    Home Living                          Prior Function  PT Goals (current goals can now be found in the care plan section) Acute Rehab PT Goals Patient Stated Goal: Regain IND PT Goal Formulation: With patient Time For Goal Achievement: 09/17/23 Potential to Achieve Goals: Good Progress towards PT goals: Progressing toward goals    Frequency    7X/week      PT Plan      Co-evaluation              AM-PAC PT 6 Clicks Mobility   Outcome Measure  Help needed turning from your back to your side while in a flat bed without using bedrails?: A Little Help needed moving from lying on your back to  sitting on the side of a flat bed without using bedrails?: A Little Help needed moving to and from a bed to a chair (including a wheelchair)?: A Little Help needed standing up from a chair using your arms (e.g., wheelchair or bedside chair)?: A Little Help needed to walk in hospital room?: A Little Help needed climbing 3-5 steps with a railing? : A Lot 6 Click Score: 17    End of Session Equipment Utilized During Treatment: Gait belt Activity Tolerance: Other (comment) (orthostatic) Patient left: in bed;with call bell/phone within reach;with bed alarm set Nurse Communication: Mobility status PT Visit Diagnosis: Difficulty in walking, not elsewhere classified (R26.2)     Time: 8478-8454 PT Time Calculation (min) (ACUTE ONLY): 24 min  Charges:    $Gait Training: 8-22 mins $Therapeutic Exercise: 8-22 mins $Therapeutic Activity: 8-22 mins PT General Charges $$ ACUTE PT VISIT: 1 Visit                     [facility] PT Acute Rehabilitation Services Office [phone]    Sarah Jimenez 09/11/2023, 3:59 PM

## 2023-09-11 NOTE — Progress Notes (Signed)
 Physical Therapy Treatment Patient Details Name: Sarah Jimenez MRN: [id number] DOB: 1960/09/12 Today's Date: 09/11/2023   History of Present Illness Pt s/p R THR and with hx of Graves Dz and MVP    PT Comments  Pt cooperative and performed therex program with assist prior to moving to sitting EOB.  Pt with c/o dizziness in sitting with BP 80/51.  Pt assisted to supine and RN alerted.    If plan is discharge home, recommend the following: A little help with walking and/or transfers;A little help with bathing/dressing/bathroom;Assistance with cooking/housework;Assist for transportation;Help with stairs or ramp for entrance   Can travel by private vehicle        Equipment Recommendations  None recommended by PT    Recommendations for Other Services       Precautions / Restrictions Precautions Precautions: Fall Restrictions Weight Bearing Restrictions Per Provider Order: Yes RLE Weight Bearing Per Provider Order: Weight bearing as tolerated     Mobility  Bed Mobility Overal bed mobility: Needs Assistance Bed Mobility: Supine to Sit     Supine to sit: Min assist     General bed mobility comments: cues for sequence and use of L LE to self assist    Transfers                   General transfer comment: deferred 2* dizziness and BP drop in sitting    Ambulation/Gait                   Stairs             Wheelchair Mobility     Tilt Bed    Modified Rankin (Stroke Patients Only)       Balance Overall balance assessment: Needs assistance Sitting-balance support: No upper extremity supported, Feet supported Sitting balance-Leahy Scale: Fair                                      Hotel manager: No apparent difficulties  Cognition Arousal: Alert Behavior During Therapy: WFL for tasks assessed/performed   PT - Cognitive impairments: No apparent impairments                          Following commands: Intact      Cueing Cueing Techniques: Verbal cues  Exercises Total Joint Exercises Ankle Circles/Pumps: AROM, Both, 15 reps, Supine Quad Sets: AROM, Both, 10 reps, Supine Heel Slides: AAROM, Right, 20 reps, Supine Hip ABduction/ADduction: AAROM, Right, 15 reps, Supine    General Comments        Pertinent Vitals/Pain Pain Assessment Pain Assessment: 0-10 Pain Score: 5  Pain Location: R hip Pain Descriptors / Indicators: Aching, Burning, Sore Pain Intervention(s): Monitored during session, Limited activity within patient's tolerance, Premedicated before session, Ice applied    Home Living                          Prior Function            PT Goals (current goals can now be found in the care plan section) Acute Rehab PT Goals Patient Stated Goal: Regain IND PT Goal Formulation: With patient Time For Goal Achievement: 09/17/23 Potential to Achieve Goals: Good Progress towards PT goals: Progressing toward goals    Frequency    7X/week      PT  Plan      Co-evaluation              AM-PAC PT 6 Clicks Mobility   Outcome Measure  Help needed turning from your back to your side while in a flat bed without using bedrails?: A Little Help needed moving from lying on your back to sitting on the side of a flat bed without using bedrails?: A Little Help needed moving to and from a bed to a chair (including a wheelchair)?: A Little Help needed standing up from a chair using your arms (e.g., wheelchair or bedside chair)?: A Little Help needed to walk in hospital room?: Total Help needed climbing 3-5 steps with a railing? : Total 6 Click Score: 14    End of Session Equipment Utilized During Treatment: Gait belt Activity Tolerance: Patient tolerated treatment well Patient left: in bed;with call bell/phone within reach;with bed alarm set Nurse Communication: Mobility status PT Visit Diagnosis: Difficulty in walking, not elsewhere  classified (R26.2)     Time: 8991-8964 PT Time Calculation (min) (ACUTE ONLY): 27 min  Charges:    $Therapeutic Exercise: 8-22 mins $Therapeutic Activity: 8-22 mins PT General Charges $$ ACUTE PT VISIT: 1 Visit                     [facility] PT Acute Rehabilitation Services Office [phone]    Sarah Jimenez 09/11/2023, 1:28 PM

## 2023-09-12 DIAGNOSIS — Z96641 Presence of right artificial hip joint: Secondary | ICD-10-CM | POA: Diagnosis not present

## 2023-09-12 DIAGNOSIS — Z7982 Long term (current) use of aspirin: Secondary | ICD-10-CM | POA: Diagnosis not present

## 2023-09-12 DIAGNOSIS — M1611 Unilateral primary osteoarthritis, right hip: Secondary | ICD-10-CM | POA: Diagnosis not present

## 2023-09-12 NOTE — Plan of Care (Signed)

## 2023-09-12 NOTE — Progress Notes (Signed)
 Physical Therapy Treatment Patient Details Name: Sarah Jimenez MRN: [id number] DOB: Oct 18, 1960 Today's Date: 09/12/2023   History of Present Illness Pt s/p R THR and with hx of Graves Dz and MVP    PT Comments  Pt continues very cooperative and progressing well this pm with mobility including bed mobility with CGA, up to ambulate increased distance and negotiated stairs.  Pt with no c/o dizziness and pain well controlled.    If plan is discharge home, recommend the following: A little help with walking and/or transfers;A little help with bathing/dressing/bathroom;Assistance with cooking/housework;Assist for transportation;Help with stairs or ramp for entrance   Can travel by private vehicle        Equipment Recommendations  None recommended by PT    Recommendations for Other Services       Precautions / Restrictions Precautions Precautions: Fall Restrictions Weight Bearing Restrictions Per Provider Order: Yes RLE Weight Bearing Per Provider Order: Weight bearing as tolerated     Mobility  Bed Mobility Overal bed mobility: Needs Assistance Bed Mobility: Supine to Sit, Sit to Supine     Supine to sit: Contact guard Sit to supine: Contact guard assist   General bed mobility comments: cues for sequence and use of L LE to self assist; pt utilizing gait belt to exit bed and supporting R LE with L LE to return to bed    Transfers Overall transfer level: Needs assistance Equipment used: Rolling walker (2 wheels) Transfers: Sit to/from Stand Sit to Stand: Min assist, Contact guard assist           General transfer comment: min cues for LE management and use of UEs to self assist    Ambulation/Gait Ambulation/Gait assistance: Contact guard assist, Supervision Gait Distance (Feet): 100 Feet Assistive device: Rolling walker (2 wheels) Gait Pattern/deviations: Step-to pattern, Decreased step length - right, Decreased step length - left, Shuffle, Trunk flexed Gait  velocity: decr     General Gait Details: min cues for sequence, posture, position from RW;   Stairs Stairs: Yes Stairs assistance: Min assist Stair Management: One rail Right, Step to pattern, Forwards, With cane Number of Stairs: 3 General stair comments: cues for sequence and foot/cane placement   Wheelchair Mobility     Tilt Bed    Modified Rankin (Stroke Patients Only)       Balance Overall balance assessment: Needs assistance Sitting-balance support: No upper extremity supported, Feet supported Sitting balance-Leahy Scale: Good     Standing balance support: No upper extremity supported Standing balance-Leahy Scale: Fair                              Hotel manager: No apparent difficulties  Cognition Arousal: Alert Behavior During Therapy: WFL for tasks assessed/performed   PT - Cognitive impairments: No apparent impairments                         Following commands: Intact      Cueing Cueing Techniques: Verbal cues  Exercises Total Joint Exercises Ankle Circles/Pumps: AROM, Both, 15 reps, Supine Quad Sets: AROM, Both, 10 reps, Supine Heel Slides: AAROM, Right, 20 reps, Supine Hip ABduction/ADduction: AAROM, Right, 15 reps, Supine    General Comments        Pertinent Vitals/Pain Pain Assessment Pain Assessment: 0-10 Pain Score: 3  Pain Location: R hip Pain Descriptors / Indicators: Aching, Burning, Sore, Tightness Pain Intervention(s): Limited activity within patient's  tolerance, Monitored during session, Ice applied (pt declined premed)    Home Living                          Prior Function            PT Goals (current goals can now be found in the care plan section) Acute Rehab PT Goals Patient Stated Goal: Regain IND PT Goal Formulation: With patient Time For Goal Achievement: 09/17/23 Potential to Achieve Goals: Good Progress towards PT goals: Progressing toward goals     Frequency    7X/week      PT Plan      Co-evaluation              AM-PAC PT 6 Clicks Mobility   Outcome Measure  Help needed turning from your back to your side while in a flat bed without using bedrails?: A Little Help needed moving from lying on your back to sitting on the side of a flat bed without using bedrails?: A Little Help needed moving to and from a bed to a chair (including a wheelchair)?: A Little Help needed standing up from a chair using your arms (e.g., wheelchair or bedside chair)?: A Little Help needed to walk in hospital room?: A Little Help needed climbing 3-5 steps with a railing? : A Little 6 Click Score: 18    End of Session Equipment Utilized During Treatment: Gait belt Activity Tolerance: Patient tolerated treatment well Patient left: in bed;with call bell/phone within reach Nurse Communication: Mobility status PT Visit Diagnosis: Difficulty in walking, not elsewhere classified (R26.2)     Time: 8597-8564 PT Time Calculation (min) (ACUTE ONLY): 33 min  Charges:    $Gait Training: 8-22 mins $Therapeutic Exercise: 8-22 mins $Therapeutic Activity: 8-22 mins PT General Charges $$ ACUTE PT VISIT: 1 Visit                     [facility] PT Acute Rehabilitation Services Office [phone]    Giovanie Lefebre 09/12/2023, 3:47 PM

## 2023-09-12 NOTE — Progress Notes (Signed)
 Physical Therapy Treatment Patient Details Name: Sarah Jimenez MRN: [id number] DOB: December 06, 1960 Today's Date: 09/12/2023   History of Present Illness Pt s/p R THR and with hx of Graves Dz and MVP    PT Comments  Pt feeling better this am and tolerated up to ambulate limited distance in hall, up to bathroom for toileting and performed therex program.  Pt hopeful for dc home tomorrow.    If plan is discharge home, recommend the following: A little help with walking and/or transfers;A little help with bathing/dressing/bathroom;Assistance with cooking/housework;Assist for transportation;Help with stairs or ramp for entrance   Can travel by private vehicle        Equipment Recommendations  None recommended by PT    Recommendations for Other Services       Precautions / Restrictions Precautions Precautions: Fall Restrictions Weight Bearing Restrictions Per Provider Order: Yes     Mobility  Bed Mobility Overal bed mobility: Needs Assistance Bed Mobility: Supine to Sit     Supine to sit: Min assist     General bed mobility comments: cues for sequence and use of L LE to self assist    Transfers Overall transfer level: Needs assistance Equipment used: Rolling walker (2 wheels) Transfers: Sit to/from Stand Sit to Stand: Min assist           General transfer comment: cues for LE management and use of UEs to self assist    Ambulation/Gait Ambulation/Gait assistance: Min assist Gait Distance (Feet): 70 Feet (and 15' back from bathroom) Assistive device: Rolling walker (2 wheels) Gait Pattern/deviations: Step-to pattern, Decreased step length - right, Decreased step length - left, Shuffle, Trunk flexed Gait velocity: decr     General Gait Details: cues for sequence, posture, position from RW; distance ltd by fatigue/pain   Stairs             Wheelchair Mobility     Tilt Bed    Modified Rankin (Stroke Patients Only)       Balance Overall balance  assessment: Needs assistance Sitting-balance support: No upper extremity supported, Feet supported Sitting balance-Leahy Scale: Good     Standing balance support: Bilateral upper extremity supported Standing balance-Leahy Scale: Poor                              Communication Communication Communication: No apparent difficulties  Cognition Arousal: Alert Behavior During Therapy: WFL for tasks assessed/performed   PT - Cognitive impairments: No apparent impairments                         Following commands: Intact      Cueing Cueing Techniques: Verbal cues  Exercises Total Joint Exercises Ankle Circles/Pumps: AROM, Both, 15 reps, Supine Quad Sets: AROM, Both, 10 reps, Supine Heel Slides: AAROM, Right, 20 reps, Supine Hip ABduction/ADduction: AAROM, Right, 15 reps, Supine    General Comments        Pertinent Vitals/Pain Pain Assessment Pain Assessment: 0-10 Pain Score: 4  Pain Location: R hip Pain Descriptors / Indicators: Aching, Burning, Sore, Tightness Pain Intervention(s): Limited activity within patient's tolerance, Monitored during session, Premedicated before session, Ice applied    Home Living                          Prior Function            PT Goals (current goals can  now be found in the care plan section) Acute Rehab PT Goals Patient Stated Goal: Regain IND PT Goal Formulation: With patient Time For Goal Achievement: 09/17/23 Potential to Achieve Goals: Good Progress towards PT goals: Progressing toward goals    Frequency    7X/week      PT Plan      Co-evaluation              AM-PAC PT 6 Clicks Mobility   Outcome Measure  Help needed turning from your back to your side while in a flat bed without using bedrails?: A Little Help needed moving from lying on your back to sitting on the side of a flat bed without using bedrails?: A Little Help needed moving to and from a bed to a chair (including a  wheelchair)?: A Little Help needed standing up from a chair using your arms (e.g., wheelchair or bedside chair)?: A Little Help needed to walk in hospital room?: A Little Help needed climbing 3-5 steps with a railing? : A Lot 6 Click Score: 17    End of Session Equipment Utilized During Treatment: Gait belt Activity Tolerance: Patient tolerated treatment well Patient left: in chair;with call bell/phone within reach Nurse Communication: Mobility status PT Visit Diagnosis: Difficulty in walking, not elsewhere classified (R26.2)     Time: 9078-9044 PT Time Calculation (min) (ACUTE ONLY): 34 min  Charges:    $Gait Training: 8-22 mins $Therapeutic Exercise: 8-22 mins PT General Charges $$ ACUTE PT VISIT: 1 Visit                     [facility] PT Acute Rehabilitation Services Office [phone]    Kyira Volkert 09/12/2023, 1:01 PM

## 2023-09-12 NOTE — Progress Notes (Signed)
 Orthopedic Surgery Progress Note   Assessment: Patient is a 63 y.o. female with right hip OA status post THA   Plan: -Operative plans: complete -Diet: regular -DVT ppx: aspirin  81mg  BID -Antibiotics: ancef  x2 post-op doses -Weight bearing status: as tolerated -PT evaluate and treat -Pain control -Patient will likely remain another day. Has had difficulty with even getting herself to the bathroom. If pain is better controlled and she can do her ADLs by this afternoon, could discharge today but I anticipate she will need another day  ___________________________________________________________________________  Subjective: No acute events overnight. Blood pressure was low yesterday but patient responded to fluids and is normotensive this morning. Still having significant pain in the hip and is having difficulty mobilizing. Has to have nursing help with even getting to the commode and has to have it right next to the bedside. Pain is better today than it was yesterday. Will have her daughters around to help somewhat during the recovery period.    Physical Exam:  General: no acute distress, appears stated age Neurologic: alert, answering questions appropriately, following commands Respiratory: unlabored breathing on room air, symmetric chest rise Psychiatric: appropriate affect, normal cadence to speech  MSK:   -Right lower extremity  Dressing over hip with small amount of blood (about quarter sized) otherwise c/d/i EHL/TA/GSC intact Plantarflexes and dorsiflexes toes Sensation intact to light touch in sural, saphenous, tibial, deep peroneal, and superficial peroneal nerve distributions Foot warm and well perfused   Yesterday's total administered Morphine Milligram Equivalents: 60   Patient name: Sarah Jimenez Patient MRN: [id number] Date: 09/12/23

## 2023-09-13 ENCOUNTER — Encounter (HOSPITAL_COMMUNITY): Payer: Self-pay | Admitting: Orthopaedic Surgery

## 2023-09-13 DIAGNOSIS — Z7982 Long term (current) use of aspirin: Secondary | ICD-10-CM | POA: Diagnosis not present

## 2023-09-13 DIAGNOSIS — M1611 Unilateral primary osteoarthritis, right hip: Secondary | ICD-10-CM | POA: Diagnosis not present

## 2023-09-13 DIAGNOSIS — Z96641 Presence of right artificial hip joint: Secondary | ICD-10-CM | POA: Diagnosis not present

## 2023-09-13 NOTE — Progress Notes (Signed)
 Patient ID: Sarah Jimenez, female   DOB: 1960/04/24, 63 y.o.   MRN: [id number] The patient is awake and alert this morning and feels much better overall.  She did get good rest last night.  She hopes to be able to get home today if PT goes well.  Her vital signs are stable.  Her right operative hip is stable.  I did change the dressing and the incision looks good.  We will plan for discharge to home later today if she is doing well.

## 2023-09-13 NOTE — Plan of Care (Signed)
  Problem: Coping: Goal: Level of anxiety will decrease Outcome: Progressing   Problem: Activity: Goal: Risk for activity intolerance will decrease Outcome: Progressing   Problem: Pain Managment: Goal: General experience of comfort will improve and/or be controlled Outcome: Progressing   Problem: Safety: Goal: Ability to remain free from injury will improve Outcome: Progressing

## 2023-09-13 NOTE — Progress Notes (Signed)
   09/13/23 1013  TOC Brief Assessment  Insurance and Status Reviewed  Patient has primary care physician Yes  Home environment has been reviewed home alone  Prior level of function: independent  Prior/Current Home Services No current home services  Social Drivers of Health Review SDOH reviewed no interventions necessary  Readmission risk has been reviewed Yes  Transition of care needs no transition of care needs at this time   [facility] for [facility]. Has DME at home

## 2023-09-13 NOTE — Discharge Summary (Signed)
 Patient ID: Sarah Jimenez MRN: [id number] DOB/AGE: 63/07/1960 63 y.o.  Admit date: 09/10/2023 Discharge date: 09/13/2023  Admission Diagnoses:  Principal Problem:   Unilateral primary osteoarthritis, right hip Active Problems:   Status post total replacement of right hip   Discharge Diagnoses:  Same  Past Medical History:  Diagnosis Date   Arthritis    Cancer (HCC)    Pre cancer skin   Granuloma annulare    Graves disease 09/2018   Heart murmur    MVP (mitral valve prolapse)    Osteoarthritis    right hip    Surgeries: Procedure(s): ARTHROPLASTY, HIP, TOTAL, ANTERIOR APPROACH on 09/10/2023   Consultants:   Discharged Condition: Improved  Hospital Course: LAITYN BENSEN is an 63 y.o. female who was admitted 09/10/2023 for operative treatment ofUnilateral primary osteoarthritis, right hip. Patient has severe unremitting pain that affects sleep, daily activities, and work/hobbies. After pre-op clearance the patient was taken to the operating room on 09/10/2023 and underwent  Procedure(s): ARTHROPLASTY, HIP, TOTAL, ANTERIOR APPROACH.    Patient was given perioperative antibiotics:  Anti-infectives (From admission, onward)    Start     Dose/Rate Route Frequency Ordered Stop   09/10/23 1330  ceFAZolin  (ANCEF ) IVPB 2g/100 mL premix        2 g 200 mL/hr over 30 Minutes Intravenous Every 6 hours 09/10/23 1017 09/11/23 0822   09/10/23 0600  ceFAZolin  (ANCEF ) IVPB 2g/100 mL premix        2 g 200 mL/hr over 30 Minutes Intravenous On call to O.R. 09/10/23 0540 09/10/23 0720        Patient was given sequential compression devices, early ambulation, and chemoprophylaxis to prevent DVT.  Inpatient Morphine Milligram Equivalents Per Day 8/15 - 8/18   Values displayed are in units of MME/Day    Order Start / End Date 8/15 8/16 Yesterday Today    oxyCODONE  (Oxy IR/ROXICODONE ) immediate release tablet 5 mg 8/15 - 8/15 7.5 of Unknown -- -- --    oxyCODONE  (ROXICODONE ) 5 MG/5ML  solution 5 mg 8/15 - 8/15 0 of Unknown -- -- --      Group total: 7.5 of Unknown       fentaNYL  (SUBLIMAZE ) injection 25-50 mcg 8/15 - 8/15 15 of 45-90 -- -- --    fentaNYL  (SUBLIMAZE ) injection 8/15 - 8/15 *30 of 30 -- -- --    Daily Totals  * 52.5 of Unknown (at least 75-120) -- -- --  *One-Step medication  Calculation Errors     Order Type Date Details   oxyCODONE  (Oxy IR/ROXICODONE ) immediate release tablet 5 mg Ordered Dose -- Insufficient frequency information   oxyCODONE  (ROXICODONE ) 5 MG/5ML solution 5 mg Ordered Dose -- Insufficient frequency information            Patient benefited maximally from hospital stay and there were no complications.    Recent vital signs: Patient Vitals for the past 24 hrs:  BP Temp Temp src Pulse Resp SpO2  09/13/23 0536 126/76 99.4 F (37.4 C) Oral 87 15 99 %  09/12/23 2038 128/66 98.7 F (37.1 C) Oral 76 15 96 %     Recent laboratory studies:  Recent Labs    09/11/23 0307  WBC 13.7*  HGB 10.8*  HCT 33.0*  PLT 324  NA 132*  K 4.0  CL 100  CO2 25  BUN 6*  CREATININE 0.62  GLUCOSE 121*  CALCIUM 8.4*     Discharge Medications:   Allergies as of 09/13/2023  No Known Allergies      Medication List     TAKE these medications    acyclovir  200 MG capsule Commonly known as: ZOVIRAX  TAKE 5 TIMES DAILY FOR 3 TO 5 DAYS AS NEEDED   ALPRAZolam  0.25 MG tablet Commonly known as: XANAX  TAKE 1 TABLET (0.25 MG TOTAL) BY MOUTH AT BEDTIME AS NEEDED. FOR SLEEP   aspirin  81 MG chewable tablet Chew 1 tablet (81 mg total) by mouth 2 (two) times daily.   cholecalciferol  25 MCG (1000 UNIT) tablet Commonly known as: VITAMIN D3 Take 1,000 Units by mouth daily.   clobetasol cream 0.05 % Commonly known as: TEMOVATE Apply 1 Application topically 2 (two) times daily.   methimazole  5 MG tablet Commonly known as: TAPAZOLE  Take 1.5 tablets (7.5 mg total) by mouth daily.   oxyCODONE  5 MG immediate release tablet Commonly known as:  Oxy IR/ROXICODONE  Take 1-2 tablets (5-10 mg total) by mouth every 6 (six) hours as needed for moderate pain (pain score 4-6) (pain score 4-6).   tiZANidine  4 MG tablet Commonly known as: ZANAFLEX  Take 1 tablet (4 mg total) by mouth every 6 (six) hours as needed for muscle spasms.   WOMENS MULTI GUMMIES PO Take 2 tablets by mouth daily.               Durable Medical Equipment  (From admission, onward)           Start     Ordered   09/10/23 1018  DME 3 n 1  Once        09/10/23 1017   09/10/23 1018  DME Walker rolling  Once       Question Answer Comment  Walker: With 5 Inch Wheels   Patient needs a walker to treat with the following condition Status post total replacement of right hip      09/10/23 1017            Diagnostic Studies: DG Pelvis Portable Result Date: 09/10/2023 EXAM: 1 or 2 VIEW(S) XRAY OF THE PELVIS 09/10/2023 08:53:00 AM COMPARISON: Right hip series dated 09/10/2023. CLINICAL HISTORY: Status post total replacement of right hip. FINDINGS: BONES AND JOINTS: The patient is status post right total hip arthroplasty. There is anatomic alignment of the prosthesis. No acute fracture. No focal osseous lesion. No joint dislocation. SOFT TISSUES: There is mild soft tissue gas. IMPRESSION: 1. Status post right total hip arthroplasty with anatomic alignment of the prosthesis. 2. Mild soft tissue gas. Electronically signed by: evalene coho 09/10/2023 09:06 AM EDT RP Workstation: [id number]   DG HIP UNILAT WITH PELVIS 1V RIGHT Result Date: 09/10/2023 EXAM: 1 VIEW XRAY OF THE RIGHT HIP 09/10/2023 08:23:00 AM COMPARISON: Right hip series dated 08/25/2023. CLINICAL HISTORY: Surgery, elective. Total right hip arthroplasty. FINDINGS: 6 spot fluoroscopic radiographs of the right hip were obtained intraoperatively during right total hip arthroplasty. Fluoroscopy time was 17 seconds and cumulative dose was 2.1 mGy. There is anatomic alignment of the prosthesis. BONES AND  JOINTS: No acute fracture or focal osseous lesion. The hip joint is maintained. No significant degenerative changes. SOFT TISSUES: The soft tissues are unremarkable. IMPRESSION: 1. Anatomic alignment of the right hip prosthesis. Electronically signed by: evalene coho 09/10/2023 09:06 AM EDT RP Workstation: [id number]   DG C-Arm 1-60 Min-No Report Result Date: 09/10/2023 Fluoroscopy was utilized by the requesting physician.  No radiographic interpretation.   XR HIP UNILAT W OR W/O PELVIS 1V RIGHT Result Date: 08/25/2023 An AP pelvis shows significant right hip joint  space narrowing when compared to previous films from 2023.   Disposition: Discharge disposition: 01-Home or Self Care          Follow-up Information     Vernetta Lonni GRADE, MD Follow up in 2 week(s).   Specialty: Orthopedic Surgery Contact information: [address] [place]  [place] [address] [phone]                  Signed: Lonni GRADE Vernetta 09/13/2023, 3:18 PM

## 2023-09-13 NOTE — Progress Notes (Signed)
 Physical Therapy Treatment Patient Details Name: Sarah Jimenez MRN: [id number] DOB: 1960/11/28 Today's Date: 09/13/2023   History of Present Illness Pt s/p R THR and with hx of Graves Dz and MVP    PT Comments  Pt progressing very well today, meeting goals. No dizziness/lightheadedness with amb. Pt is ready to d/c from PT standpoint with dtr assisting prn    If plan is discharge home, recommend the following: A little help with walking and/or transfers;A little help with bathing/dressing/bathroom;Assistance with cooking/housework;Assist for transportation;Help with stairs or ramp for entrance   Can travel by private vehicle        Equipment Recommendations  None recommended by PT    Recommendations for Other Services       Precautions / Restrictions Precautions Precautions: Fall Recall of Precautions/Restrictions: Intact Restrictions Weight Bearing Restrictions Per Provider Order: Yes RLE Weight Bearing Per Provider Order: Weight bearing as tolerated     Mobility  Bed Mobility                    Transfers Overall transfer level: Needs assistance Equipment used: Rolling walker (2 wheels) Transfers: Sit to/from Stand Sit to Stand: Supervision           General transfer comment: pt demonstrates carryover from prior session for hand placement    Ambulation/Gait Ambulation/Gait assistance: Supervision Gait Distance (Feet): 140 Feet Assistive device: Rolling walker (2 wheels) Gait Pattern/deviations: Step-to pattern, Shuffle, Trunk flexed, Step-through pattern Gait velocity: decr     General Gait Details: ongoing education for sequence, posture, position from RW; pt demonstrates carryover, good stability with RW, no LOB, no dizziness   Stairs         General stair comments: verbally reviewed, pt states she does not feel she needs to practice again   Wheelchair Mobility     Tilt Bed    Modified Rankin (Stroke Patients Only)        Balance Overall balance assessment: Needs assistance Sitting-balance support: No upper extremity supported, Feet supported Sitting balance-Leahy Scale: Good     Standing balance support: No upper extremity supported, During functional activity, Reliant on assistive device for balance Standing balance-Leahy Scale: Fair                              Hotel manager: No apparent difficulties  Cognition Arousal: Alert Behavior During Therapy: WFL for tasks assessed/performed   PT - Cognitive impairments: No apparent impairments                         Following commands: Intact      Cueing Cueing Techniques: Verbal cues  Exercises Total Joint Exercises Quad Sets: AROM, Both, 10 reps, Supine    General Comments        Pertinent Vitals/Pain Pain Assessment Pain Assessment: 0-10 Pain Score: 5  Pain Location: R hip Pain Descriptors / Indicators: Aching, Burning, Sore, Tightness Pain Intervention(s): Limited activity within patient's tolerance, Monitored during session    Home Living                          Prior Function            PT Goals (current goals can now be found in the care plan section) Acute Rehab PT Goals Patient Stated Goal: Regain IND PT Goal Formulation: With patient Time For Goal Achievement: 09/17/23  Potential to Achieve Goals: Good Progress towards PT goals: Progressing toward goals    Frequency    7X/week      PT Plan      Co-evaluation              AM-PAC PT 6 Clicks Mobility   Outcome Measure  Help needed turning from your back to your side while in a flat bed without using bedrails?: A Little Help needed moving from lying on your back to sitting on the side of a flat bed without using bedrails?: A Little Help needed moving to and from a bed to a chair (including a wheelchair)?: A Little Help needed standing up from a chair using your arms (e.g., wheelchair or  bedside chair)?: A Little Help needed to walk in hospital room?: A Little Help needed climbing 3-5 steps with a railing? : A Little 6 Click Score: 18    End of Session Equipment Utilized During Treatment: Gait belt Activity Tolerance: Patient tolerated treatment well Patient left: in bed;with call bell/phone within reach Nurse Communication: Mobility status PT Visit Diagnosis: Difficulty in walking, not elsewhere classified (R26.2)     Time: 8998-8978 PT Time Calculation (min) (ACUTE ONLY): 20 min  Charges:    $Gait Training: 8-22 mins PT General Charges $$ ACUTE PT VISIT: 1 Visit                     Fayez Sturgell, PT  Acute Rehab Dept [facility]) [phone]  09/13/2023    [facility] 09/13/2023, 10:42 AM

## 2023-09-14 ENCOUNTER — Encounter: Payer: Self-pay | Admitting: Orthopaedic Surgery

## 2023-09-15 DIAGNOSIS — Z96641 Presence of right artificial hip joint: Secondary | ICD-10-CM | POA: Diagnosis not present

## 2023-09-15 DIAGNOSIS — K589 Irritable bowel syndrome without diarrhea: Secondary | ICD-10-CM | POA: Diagnosis not present

## 2023-09-15 DIAGNOSIS — Z471 Aftercare following joint replacement surgery: Secondary | ICD-10-CM | POA: Diagnosis not present

## 2023-09-15 DIAGNOSIS — E0500 Thyrotoxicosis with diffuse goiter without thyrotoxic crisis or storm: Secondary | ICD-10-CM | POA: Diagnosis not present

## 2023-09-15 DIAGNOSIS — Z79891 Long term (current) use of opiate analgesic: Secondary | ICD-10-CM | POA: Diagnosis not present

## 2023-09-15 DIAGNOSIS — Z7982 Long term (current) use of aspirin: Secondary | ICD-10-CM | POA: Diagnosis not present

## 2023-09-15 DIAGNOSIS — L920 Granuloma annulare: Secondary | ICD-10-CM | POA: Diagnosis not present

## 2023-09-15 DIAGNOSIS — Z602 Problems related to living alone: Secondary | ICD-10-CM | POA: Diagnosis not present

## 2023-09-15 DIAGNOSIS — I341 Nonrheumatic mitral (valve) prolapse: Secondary | ICD-10-CM | POA: Diagnosis not present

## 2023-09-15 DIAGNOSIS — Z85828 Personal history of other malignant neoplasm of skin: Secondary | ICD-10-CM | POA: Diagnosis not present

## 2023-09-17 DIAGNOSIS — K589 Irritable bowel syndrome without diarrhea: Secondary | ICD-10-CM | POA: Diagnosis not present

## 2023-09-17 DIAGNOSIS — Z96641 Presence of right artificial hip joint: Secondary | ICD-10-CM | POA: Diagnosis not present

## 2023-09-17 DIAGNOSIS — Z7982 Long term (current) use of aspirin: Secondary | ICD-10-CM | POA: Diagnosis not present

## 2023-09-17 DIAGNOSIS — Z79891 Long term (current) use of opiate analgesic: Secondary | ICD-10-CM | POA: Diagnosis not present

## 2023-09-17 DIAGNOSIS — I341 Nonrheumatic mitral (valve) prolapse: Secondary | ICD-10-CM | POA: Diagnosis not present

## 2023-09-17 DIAGNOSIS — Z85828 Personal history of other malignant neoplasm of skin: Secondary | ICD-10-CM | POA: Diagnosis not present

## 2023-09-17 DIAGNOSIS — E0500 Thyrotoxicosis with diffuse goiter without thyrotoxic crisis or storm: Secondary | ICD-10-CM | POA: Diagnosis not present

## 2023-09-17 DIAGNOSIS — Z471 Aftercare following joint replacement surgery: Secondary | ICD-10-CM | POA: Diagnosis not present

## 2023-09-17 DIAGNOSIS — Z602 Problems related to living alone: Secondary | ICD-10-CM | POA: Diagnosis not present

## 2023-09-17 DIAGNOSIS — L920 Granuloma annulare: Secondary | ICD-10-CM | POA: Diagnosis not present

## 2023-09-17 NOTE — Telephone Encounter (Signed)
[facility] patient 

## 2023-09-20 DIAGNOSIS — Z96641 Presence of right artificial hip joint: Secondary | ICD-10-CM | POA: Diagnosis not present

## 2023-09-20 DIAGNOSIS — Z85828 Personal history of other malignant neoplasm of skin: Secondary | ICD-10-CM | POA: Diagnosis not present

## 2023-09-20 DIAGNOSIS — Z471 Aftercare following joint replacement surgery: Secondary | ICD-10-CM | POA: Diagnosis not present

## 2023-09-20 DIAGNOSIS — Z602 Problems related to living alone: Secondary | ICD-10-CM | POA: Diagnosis not present

## 2023-09-20 DIAGNOSIS — I341 Nonrheumatic mitral (valve) prolapse: Secondary | ICD-10-CM | POA: Diagnosis not present

## 2023-09-20 DIAGNOSIS — Z7982 Long term (current) use of aspirin: Secondary | ICD-10-CM | POA: Diagnosis not present

## 2023-09-20 DIAGNOSIS — Z79891 Long term (current) use of opiate analgesic: Secondary | ICD-10-CM | POA: Diagnosis not present

## 2023-09-20 DIAGNOSIS — K589 Irritable bowel syndrome without diarrhea: Secondary | ICD-10-CM | POA: Diagnosis not present

## 2023-09-20 DIAGNOSIS — L920 Granuloma annulare: Secondary | ICD-10-CM | POA: Diagnosis not present

## 2023-09-20 DIAGNOSIS — E0500 Thyrotoxicosis with diffuse goiter without thyrotoxic crisis or storm: Secondary | ICD-10-CM | POA: Diagnosis not present

## 2023-09-21 DIAGNOSIS — Z602 Problems related to living alone: Secondary | ICD-10-CM | POA: Diagnosis not present

## 2023-09-21 DIAGNOSIS — K589 Irritable bowel syndrome without diarrhea: Secondary | ICD-10-CM | POA: Diagnosis not present

## 2023-09-21 DIAGNOSIS — Z96641 Presence of right artificial hip joint: Secondary | ICD-10-CM | POA: Diagnosis not present

## 2023-09-21 DIAGNOSIS — Z79891 Long term (current) use of opiate analgesic: Secondary | ICD-10-CM | POA: Diagnosis not present

## 2023-09-21 DIAGNOSIS — E0500 Thyrotoxicosis with diffuse goiter without thyrotoxic crisis or storm: Secondary | ICD-10-CM | POA: Diagnosis not present

## 2023-09-21 DIAGNOSIS — L920 Granuloma annulare: Secondary | ICD-10-CM | POA: Diagnosis not present

## 2023-09-21 DIAGNOSIS — Z471 Aftercare following joint replacement surgery: Secondary | ICD-10-CM | POA: Diagnosis not present

## 2023-09-21 DIAGNOSIS — I341 Nonrheumatic mitral (valve) prolapse: Secondary | ICD-10-CM | POA: Diagnosis not present

## 2023-09-21 DIAGNOSIS — Z85828 Personal history of other malignant neoplasm of skin: Secondary | ICD-10-CM | POA: Diagnosis not present

## 2023-09-21 DIAGNOSIS — Z7982 Long term (current) use of aspirin: Secondary | ICD-10-CM | POA: Diagnosis not present

## 2023-09-22 DIAGNOSIS — E0500 Thyrotoxicosis with diffuse goiter without thyrotoxic crisis or storm: Secondary | ICD-10-CM | POA: Diagnosis not present

## 2023-09-22 DIAGNOSIS — Z96641 Presence of right artificial hip joint: Secondary | ICD-10-CM | POA: Diagnosis not present

## 2023-09-22 DIAGNOSIS — I341 Nonrheumatic mitral (valve) prolapse: Secondary | ICD-10-CM | POA: Diagnosis not present

## 2023-09-22 DIAGNOSIS — Z7982 Long term (current) use of aspirin: Secondary | ICD-10-CM | POA: Diagnosis not present

## 2023-09-22 DIAGNOSIS — Z471 Aftercare following joint replacement surgery: Secondary | ICD-10-CM | POA: Diagnosis not present

## 2023-09-22 DIAGNOSIS — L920 Granuloma annulare: Secondary | ICD-10-CM | POA: Diagnosis not present

## 2023-09-22 DIAGNOSIS — Z85828 Personal history of other malignant neoplasm of skin: Secondary | ICD-10-CM | POA: Diagnosis not present

## 2023-09-22 DIAGNOSIS — Z602 Problems related to living alone: Secondary | ICD-10-CM | POA: Diagnosis not present

## 2023-09-22 DIAGNOSIS — Z79891 Long term (current) use of opiate analgesic: Secondary | ICD-10-CM | POA: Diagnosis not present

## 2023-09-22 DIAGNOSIS — K589 Irritable bowel syndrome without diarrhea: Secondary | ICD-10-CM | POA: Diagnosis not present

## 2023-09-23 ENCOUNTER — Encounter: Payer: Self-pay | Admitting: Orthopaedic Surgery

## 2023-09-23 ENCOUNTER — Ambulatory Visit (INDEPENDENT_AMBULATORY_CARE_PROVIDER_SITE_OTHER): Admitting: Orthopaedic Surgery

## 2023-09-23 DIAGNOSIS — Z96641 Presence of right artificial hip joint: Secondary | ICD-10-CM

## 2023-09-23 NOTE — Progress Notes (Signed)
 The patient is a 63 year old female who is here for first postoperative visit 2 weeks status post a right total hip arthroplasty to treat significant right hip pain and arthritis.  She is ambulating with a walker today.  She has had home therapy helping her transition from a walker to a cane.  She did let us  know that her extra days in the hospital were denied by her insurance company.  However, it was medically necessary that we kept her in the hospital.  It was well-documented by nursing and by physical therapy that she had significant orthostatic hypotension following surgery when she was up the next day with therapy and it was documented that she had a blood pressure down to 80/51.  She was dizzy as well and was a significant fall risk.  That was on postoperative day 1 the day after surgery.  Her postoperative hemoglobin the day after surgery was down to 10.8 from almost 14 so she did have acute blood loss anemia as well.  Both postoperative day 2 she was improving but was still not safe for discharge until August 18.  Just when she was safe and mobilizing well and had stabilized in terms of her blood pressure.  Today her right hip incision looks good.  Staples are removed and Steri-Strips applied.  She does have a hematoma but no significant seroma.  Calf is soft bilaterally and there is no foot and ankle swelling.  She can stop her compressive hose as well as her baby aspirin  twice daily.  She will slowly increase her activities as she tolerates.  We will see her back in 4 weeks to see how she is doing overall.

## 2023-09-28 ENCOUNTER — Telehealth: Payer: Self-pay | Admitting: Orthopaedic Surgery

## 2023-09-28 NOTE — Telephone Encounter (Signed)
 Patient called and said that she had to stay two more knights at the hospital and that the insurance company wouldn't pay for the them. She had some questions about what's going on. CB#[phone]

## 2023-09-28 NOTE — Telephone Encounter (Signed)
 Spoke with patient and let her know she should re-contact [facility]'s main billing department (number on her bill) and have them re-submit notes to [facility] from her hospitalization that indicate why she had to stay extra nights as well as Dr. Damian 09/23/23 office visit note that lays out why she had to stay. She states she will do this.

## 2023-10-03 NOTE — Telephone Encounter (Signed)
 SABRA

## 2023-10-25 ENCOUNTER — Ambulatory Visit (INDEPENDENT_AMBULATORY_CARE_PROVIDER_SITE_OTHER): Admitting: Orthopaedic Surgery

## 2023-10-25 DIAGNOSIS — Z96641 Presence of right artificial hip joint: Secondary | ICD-10-CM

## 2023-10-25 NOTE — Progress Notes (Signed)
 The patient is an active 63 year old female who is now 6-week status post a right hip replacement.  She states that she does have some pain with straight leg raise when she is in bed exercising but overall she is doing well.  She is ambulating with a cane when she is out in public.  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  She is doing well otherwise.  On exam her right hip moves smoothly and fluidly with no blocks to rotation at all.  At this point she will continue to increase her activities as comfort allows with mainly just walking for exercise.  If there are no issues we will see her back in 6 months with a standing AP pelvis and lateral of the right operative hip.

## 2023-11-01 ENCOUNTER — Other Ambulatory Visit

## 2023-11-01 DIAGNOSIS — E0500 Thyrotoxicosis with diffuse goiter without thyrotoxic crisis or storm: Secondary | ICD-10-CM | POA: Diagnosis not present

## 2023-11-02 ENCOUNTER — Ambulatory Visit (INDEPENDENT_AMBULATORY_CARE_PROVIDER_SITE_OTHER)

## 2023-11-02 DIAGNOSIS — Z23 Encounter for immunization: Secondary | ICD-10-CM

## 2023-11-02 LAB — TSH: TSH: 3.56 m[IU]/L (ref 0.40–4.50)

## 2023-11-02 LAB — T4, FREE: Free T4: 0.9 ng/dL (ref 0.8–1.8)

## 2023-11-02 LAB — T3, FREE: T3, Free: 3.2 pg/mL (ref 2.3–4.2)

## 2023-11-02 NOTE — Progress Notes (Signed)
 Pt came in on the Nurse schedule to receive her Flu vaccine per [facility]. Administered in the Right deltoid area without any complaints

## 2023-11-09 ENCOUNTER — Encounter: Payer: Self-pay | Admitting: Orthopaedic Surgery

## 2023-11-24 DIAGNOSIS — L570 Actinic keratosis: Secondary | ICD-10-CM | POA: Diagnosis not present

## 2023-11-29 ENCOUNTER — Encounter: Payer: Self-pay | Admitting: Radiology

## 2023-12-27 DIAGNOSIS — Z85828 Personal history of other malignant neoplasm of skin: Secondary | ICD-10-CM | POA: Diagnosis not present

## 2023-12-27 DIAGNOSIS — L821 Other seborrheic keratosis: Secondary | ICD-10-CM | POA: Diagnosis not present

## 2023-12-27 DIAGNOSIS — L570 Actinic keratosis: Secondary | ICD-10-CM | POA: Diagnosis not present

## 2023-12-27 DIAGNOSIS — D2262 Melanocytic nevi of left upper limb, including shoulder: Secondary | ICD-10-CM | POA: Diagnosis not present

## 2024-01-07 ENCOUNTER — Other Ambulatory Visit

## 2024-01-07 ENCOUNTER — Ambulatory Visit: Admitting: Internal Medicine

## 2024-01-07 ENCOUNTER — Encounter: Payer: Self-pay | Admitting: Internal Medicine

## 2024-01-07 VITALS — BP 120/60 | HR 78 | Ht 62.5 in | Wt 149.6 lb

## 2024-01-07 DIAGNOSIS — E0500 Thyrotoxicosis with diffuse goiter without thyrotoxic crisis or storm: Secondary | ICD-10-CM

## 2024-01-07 NOTE — Patient Instructions (Addendum)
 Please stop at the lab.  Please continue Methimazole  7.5 mg daily.  Please come back for a follow-up appointment in 1 year, but in 6 months for labs

## 2024-01-07 NOTE — Progress Notes (Addendum)
 Patient ID: Sarah Jimenez, female   DOB: 25-Jul-1960, 63 y.o.   MRN: [id number]   HPI  Sarah Jimenez is a 63 y.o.-year-old female, initially referred by her gastroenterologist, Dr. Eda, returning for follow-up for Graves' disease. Last visit 6 months ago.  Interim history: She denies tremors, palpitations, insomnia, anxiety, fatigue.   She previously had right hip pain (osteoarthritis) and had total hip replacement 09/10/2023.  She is better after distention.  And is now able to exercise.  Reviewed and addended history: Patient was found to have an undetectable TSH during investigation of diarrhea, which started in 04/2017.  Previous TSH levels from 2017 and 2014 were normal.  Reviewed her TFTs: Lab Results  Component Value Date   TSH 3.56 11/01/2023   TSH 4.56 (H) 09/06/2023   TSH 1.93 07/08/2023   TSH 0.78 05/07/2023   TSH 0.04 (L) 03/31/2023   TSH 0.01 (L) 02/17/2023   TSH 0.01 (L) 01/06/2023   TSH 0.11 (L) 11/11/2022   TSH 7.64 (H) 10/01/2022   TSH 0.01 (L) 08/13/2022   FREET4 0.9 11/01/2023   FREET4 1.1 09/06/2023   FREET4 1.0 07/08/2023   FREET4 0.9 05/07/2023   FREET4 1.0 03/31/2023   FREET4 1.5 02/17/2023   FREET4 1.7 01/06/2023   FREET4 0.98 11/11/2022   FREET4 0.54 (L) 10/01/2022   FREET4 0.96 08/13/2022   T3FREE 3.2 11/01/2023   T3FREE 3.1 09/06/2023   T3FREE 3.0 07/08/2023   T3FREE 2.8 05/07/2023   T3FREE 3.0 03/31/2023   T3FREE 4.6 (H) 02/17/2023   T3FREE 5.5 (H) 01/06/2023   T3FREE 4.1 11/11/2022   T3FREE 3.7 10/01/2022   T3FREE 4.5 (H) 08/13/2022   Her TSI antibodies were elevated at last check: Lab Results  Component Value Date   TSI 276 (H) 01/06/2023   TSI 112 08/16/2018   Thyroid  uptake and scan (10/13/2018): Uptake at 4 hours 29.4%, uptake in 24 hours, 42.5%, both elevated.  Uniform scan, consistent with Graves' disease.  08/2018: We started methimazole  (MMI), initially 5 mg daily. 05/2018: MMI increase to 5 mg 2x a day 10/2018: MMI  decrease to 5 mg in a.m. and 2.5 mg in p.m. 11/2018: She increased MMI dose to 5 mg 2x a day 03/2019: MMI decreased to 5 mg daily 05/2019: MMI decreased to 2.5 mg daily 08/2019: she stopped MMI by herself 10/2019: MMI restarted at 5 mg daily 12/2020: MMI increase to 7.5 mg daily 07/2021: MMI increased to 5 mg 2x a day (could not tolerated 10 mg in am and 5 mg in pm - AP) 12/2021: MMI decreased to 5 mg daily 06/2022: MMI increased to 5 mg 2x a day 09/2022: MMI decreased to 5 mg daily 10/2022: MMI increased to 5 mg alternating with 10 mg every other day 12/2022: MMI increased to 10 mg daily 08/2023: MMI decreased to 7.5 mg daily  Pt denies: - feeling nodules in neck - hoarseness - dysphagia - choking  She initially had the following symptoms: Diarrhea, which improved after changing her diet and cutting out meat and dairy.   Occasional palpitations Fatigue Insomnia Tremors The symptoms have resolved.  No FH of thyroid  disease or thyroid  cancer. No h/o radiation tx to head or neck. No Biotin use. No recent steroids use.  Previously getting steroid injections in the right hip.  + CBD gummieat night - helps her sleep. Of note, a DXA scan was normal in 2017. 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 She also has a history  of frozen shoulders.  ROS: + see HPI  I reviewed pt's medications, allergies, PMH, social hx, family hx, and changes were documented in the history of present illness. Otherwise, unchanged from my initial visit note.  Past Medical History:  Diagnosis Date   Arthritis    Cancer [facility])    Pre cancer skin   Granuloma annulare    Graves disease 09/2018   Heart murmur    MVP (mitral valve prolapse)    Osteoarthritis    right hip   Past Surgical History:  Procedure Laterality Date   COLONOSCOPY  05/08/2011   Dr.Brodie   DILATATION & CURETTAGE/HYSTEROSCOPY WITH MYOSURE N/A 01/01/2015   Procedure: DILATATION & CURETTAGE/HYSTEROSCOPY WITH MYOSURE;   Surgeon: Curlee VEAR Guan, MD;  Location: [facility];  Service: Gynecology;  Laterality: N/A;   pyloric stenosis  8080   57 month old   SMALL INTESTINE SURGERY  Pyloric stenosis age 50 months   TOTAL HIP ARTHROPLASTY Right 09/10/2023   Procedure: ARTHROPLASTY, HIP, TOTAL, ANTERIOR APPROACH;  Surgeon: Vernetta Lonni GRADE, MD;  Location: [facility];  Service: Orthopedics;  Laterality: Right;   Social History   Socioeconomic History   Marital status: Single    Spouse name: Not on file   Number of children: 4   Years of education: Not on file   Highest education level: Not on file  Occupational History    Communication coordinator  Tobacco Use   Smoking status: Never Smoker   Smokeless tobacco: Never Used  Substance and Sexual Activity   Alcohol use: Yes    Alcohol/week: 2.0 standard drinks    Types: 2 Glasses of wine per week    Comment: LITTLE   Drug use: Yes    Comment: Edible Marijuana tried while in [place]    Sexual activity: Not Currently    Birth control/protection: Other-see comments    Comment: vasectomy, INTERCOURSE AGE 73, SEXUAL PARTNERS MORE THAN 5   Current Outpatient Medications on File Prior to Visit  Medication Sig Dispense Refill   acyclovir  (ZOVIRAX ) 200 MG capsule TAKE 5 TIMES DAILY FOR 3 TO 5 DAYS AS NEEDED 90 capsule 0   ALPRAZolam  (XANAX ) 0.25 MG tablet TAKE 1 TABLET (0.25 MG TOTAL) BY MOUTH AT BEDTIME AS NEEDED. FOR SLEEP 30 tablet 0   aspirin  81 MG chewable tablet Chew 1 tablet (81 mg total) by mouth 2 (two) times daily. 30 tablet 0   cholecalciferol  (VITAMIN D3) 25 MCG (1000 UNIT) tablet Take 1,000 Units by mouth daily.     clobetasol cream (TEMOVATE) 0.05 % Apply 1 Application topically 2 (two) times daily.     methimazole  (TAPAZOLE ) 5 MG tablet Take 1.5 tablets (7.5 mg total) by mouth daily.     Multiple Vitamins-Minerals (WOMENS MULTI GUMMIES PO) Take 2 tablets by mouth daily.     oxyCODONE  (OXY IR/ROXICODONE ) 5 MG immediate release tablet Take 1-2 tablets  (5-10 mg total) by mouth every 6 (six) hours as needed for moderate pain (pain score 4-6) (pain score 4-6). 30 tablet 0   tiZANidine  (ZANAFLEX ) 4 MG tablet Take 1 tablet (4 mg total) by mouth every 6 (six) hours as needed for muscle spasms. 30 tablet 0   No current facility-administered medications on file prior to visit.   No Known Allergies Family History  Problem Relation Age of Onset   Dementia Mother    Hypertension Father    Cancer Father 18       pancreatic   Cancer Sister    Anxiety disorder  Sister    Cancer Sister    Depression Sister    Asthma Sister    Depression Brother    Mental illness Brother    Hypertension Brother    Anxiety disorder Brother    Depression Brother    Asthma Brother    Hypertension Daughter    ADD / ADHD Daughter    Depression Daughter    Anxiety disorder Daughter    Learning disabilities Daughter    Kidney disease Son    ADD / ADHD Son    Anxiety disorder Son    Learning disabilities Son    Anxiety disorder Son    Depression Son    Learning disabilities Son    Obesity Maternal Aunt    Colon cancer Paternal Uncle 26   Early death Maternal Grandmother    Heart disease Maternal Grandmother    Hearing loss Maternal Grandfather    Early death Maternal Grandfather    Heart disease Maternal Grandfather    Stomach cancer Neg Hx    Esophageal cancer Neg Hx    Colon polyps Neg Hx    PE: BP 120/60   Pulse 78   Ht 5' 2.5 (1.588 m)   Wt 149 lb 9.6 oz (67.9 kg)   LMP 10/30/2014   SpO2 96%   BMI 26.93 kg/m  Wt Readings from Last 10 Encounters:  01/07/24 149 lb 9.6 oz (67.9 kg)  09/10/23 143 lb 4.8 oz (65 kg)  09/01/23 143 lb 3.2 oz (65 kg)  07/08/23 145 lb (65.8 kg)  06/15/23 146 lb 3.2 oz (66.3 kg)  04/14/23 144 lb 6 oz (65.5 kg)  01/06/23 149 lb 12.8 oz (67.9 kg)  12/16/22 151 lb 9.6 oz (68.8 kg)  04/20/22 149 lb (67.6 kg)  03/23/22 149 lb 6.4 oz (67.8 kg)   Constitutional: normal weight, in NAD Eyes:  EOMI, no  exophthalmos, ENT: no neck masses, no cervical lymphadenopathy Cardiovascular: RRR, No MRG Respiratory: CTA B Musculoskeletal: no deformities Skin:no rashes except erythema annulare on feet Neurological: no tremor with outstretched hands  ASSESSMENT: 1.  Graves' disease  2. Weight gain  PLAN:  1. Patient with with history of thyrotoxicosis and a history of diarrhea and palpitations, but without weight loss, heat intolerance, significant anxiety, diagnosed with Graves' disease after TSI antibodies were elevated.  We started methimazole  and the dose was subsequently adjusted.   - She tolerates methimazole  well.  White blood cell count was normal in 08/2023 and LFTs were normal in 05/2023. - We previously discussed about the variability in her TFTs.  I again recommended definitive treatment: RAI treatment or surgery but she mentioned that she wanted to wait for hip surgery and afterwards consider RAI treatment.  We discussed about consequences of RAI treatment, with the vast majority of the patient becoming hypothyroid and needing levothyroxine for life.  We discussed about consequences of uncontrolled hypothyroidism.  However, I explained that hypothyroidism is much easier to manage compared to hypothyroidism.  At last visit she was not sure if she wanted to have the hip replaced or not.  Since then, she had a total hip replacement in 08/2023.  - Her TFTs at last visit were normal, so we continued the same dose of methimazole , 10 mg daily, but a TSH was slightly elevated in 08/2023, after which we decreased the methimazole  dose to 7.5 mg daily, which she continues today. - At today's visit she has no thyrotoxic signs or symptoms - We will check her TFTs and change  the methimazole  dose accordingly. - I will see her back in 6 months  2.  Weight gain -She was able to lose approximately 30 pounds on a more plant-based diet in the past and she resumed this before her last visit.   - She lost 6  pounds before the last 2 visits combined and gained 4 lbs since then - We discussed about ways to improve diet.  She is following a mostly plant-based diet and we discussed about sources of proteins in his diet.  She also started to exercise after her total hip replacement and feels she has more hungry.  Needs refills.  Orders Placed This Encounter  Procedures   TSH   T4, free   T3, free   Thyroid  stimulating immunoglobulin   Component     Latest Ref Rng 11/01/2023 01/07/2024  T4,Free(Direct)     0.8 - 1.8 ng/dL 0.9  1.6   Triiodothyronine,Free,Serum     2.3 - 4.2 pg/mL 3.2  4.8 (H)   TSH     0.40 - 4.50 mIU/L 3.56  0.02 (L)   TFTs are now in the thyrotoxic range.  TSI's are still pending. Will increase the methimazole  dose back to 10 mg daily and have her back for recheck in 5 weeks.  Lela Fendt, MD PhD [facility]

## 2024-01-10 ENCOUNTER — Ambulatory Visit: Payer: Self-pay | Admitting: Internal Medicine

## 2024-01-10 MED ORDER — METHIMAZOLE 5 MG PO TABS: 10.0000 mg | ORAL_TABLET | Freq: Every day | ORAL | 3 refills | Status: AC

## 2024-01-10 NOTE — Addendum Note (Signed)
 Addended by: TRIXIE FILE on: 01/10/2024 12:45 PM   Modules accepted: Orders

## 2024-01-12 ENCOUNTER — Telehealth: Payer: Self-pay | Admitting: Family Medicine

## 2024-01-12 LAB — TSH: TSH: 0.02 m[IU]/L — ABNORMAL LOW (ref 0.40–4.50)

## 2024-01-12 LAB — T3, FREE: T3, Free: 4.8 pg/mL — ABNORMAL HIGH (ref 2.3–4.2)

## 2024-01-12 LAB — THYROID STIMULATING IMMUNOGLOBULIN: TSI: 209 %{baseline} — ABNORMAL HIGH (ref <140)

## 2024-01-12 LAB — T4, FREE: Free T4: 1.6 ng/dL (ref 0.8–1.8)

## 2024-01-12 NOTE — Telephone Encounter (Signed)
 LVM to reschedule 04/17/24 Physical due to provider being OOO

## 2024-01-25 ENCOUNTER — Other Ambulatory Visit: Payer: Self-pay | Admitting: Nurse Practitioner

## 2024-01-25 DIAGNOSIS — F4322 Adjustment disorder with anxiety: Secondary | ICD-10-CM

## 2024-01-25 NOTE — Telephone Encounter (Signed)
 Med refill request:   ALPRAZolam  (XANAX ) 0.25 MG tablet  Start:  06/15/23 Disp:  30 tablets Refills:  0  Last AEX:  06/15/23 Next AEX:  Not yet scheduled Last MMG (if hormonal med):  N/A Refill authorized? Please Advise.

## 2024-02-18 ENCOUNTER — Other Ambulatory Visit

## 2024-03-06 ENCOUNTER — Other Ambulatory Visit (HOSPITAL_BASED_OUTPATIENT_CLINIC_OR_DEPARTMENT_OTHER)

## 2024-03-13 ENCOUNTER — Other Ambulatory Visit

## 2024-04-17 ENCOUNTER — Encounter: Admitting: Family Medicine

## 2024-04-19 ENCOUNTER — Encounter: Admitting: Family Medicine

## 2024-04-24 ENCOUNTER — Ambulatory Visit: Admitting: Orthopaedic Surgery

## 2025-01-05 ENCOUNTER — Ambulatory Visit: Admitting: Internal Medicine
